# Patient Record
Sex: Male | Born: 1940
Health system: Southern US, Community
[De-identification: ages and names within clinical notes are randomized; demographics above are authoritative.]

## PROBLEM LIST (undated history)

## (undated) DIAGNOSIS — R972 Elevated prostate specific antigen [PSA]: Secondary | ICD-10-CM

## (undated) DIAGNOSIS — I219 Acute myocardial infarction, unspecified: Secondary | ICD-10-CM

## (undated) DIAGNOSIS — R9430 Abnormal result of cardiovascular function study, unspecified: Secondary | ICD-10-CM

## (undated) DIAGNOSIS — F329 Major depressive disorder, single episode, unspecified: Secondary | ICD-10-CM

## (undated) DIAGNOSIS — Z87442 Personal history of urinary calculi: Secondary | ICD-10-CM

## (undated) DIAGNOSIS — I4891 Unspecified atrial fibrillation: Secondary | ICD-10-CM

## (undated) DIAGNOSIS — E785 Hyperlipidemia, unspecified: Secondary | ICD-10-CM

## (undated) DIAGNOSIS — Z9889 Other specified postprocedural states: Secondary | ICD-10-CM

## (undated) DIAGNOSIS — D696 Thrombocytopenia, unspecified: Secondary | ICD-10-CM

## (undated) DIAGNOSIS — I739 Peripheral vascular disease, unspecified: Secondary | ICD-10-CM

## (undated) DIAGNOSIS — N400 Enlarged prostate without lower urinary tract symptoms: Secondary | ICD-10-CM

## (undated) DIAGNOSIS — I2510 Atherosclerotic heart disease of native coronary artery without angina pectoris: Secondary | ICD-10-CM

## (undated) DIAGNOSIS — F419 Anxiety disorder, unspecified: Secondary | ICD-10-CM

## (undated) DIAGNOSIS — Z951 Presence of aortocoronary bypass graft: Secondary | ICD-10-CM

## (undated) DIAGNOSIS — I779 Disorder of arteries and arterioles, unspecified: Secondary | ICD-10-CM

## (undated) DIAGNOSIS — M1990 Unspecified osteoarthritis, unspecified site: Secondary | ICD-10-CM

## (undated) DIAGNOSIS — N419 Inflammatory disease of prostate, unspecified: Secondary | ICD-10-CM

## (undated) DIAGNOSIS — IMO0002 Reserved for concepts with insufficient information to code with codable children: Secondary | ICD-10-CM

## (undated) HISTORY — DX: Anxiety disorder, unspecified: F41.9

## (undated) HISTORY — DX: Hyperlipidemia, unspecified: E78.5

## (undated) HISTORY — PX: HYDROCELE EXCISION: SHX482

## (undated) HISTORY — DX: Elevated prostate specific antigen (PSA): R97.20

## (undated) HISTORY — DX: Unspecified atrial fibrillation: I48.91

## (undated) HISTORY — DX: Major depressive disorder, single episode, unspecified: F32.9

## (undated) HISTORY — DX: Thrombocytopenia, unspecified: D69.6

## (undated) HISTORY — DX: Presence of aortocoronary bypass graft: Z95.1

## (undated) HISTORY — DX: Atherosclerotic heart disease of native coronary artery without angina pectoris: I25.10

## (undated) HISTORY — DX: Personal history of urinary calculi: Z87.442

## (undated) HISTORY — PX: OTHER SURGICAL HISTORY: SHX169

## (undated) HISTORY — PX: HEMORRHOID SURGERY: SHX153

## (undated) HISTORY — DX: Benign prostatic hyperplasia without lower urinary tract symptoms: N40.0

## (undated) HISTORY — DX: Disorder of arteries and arterioles, unspecified: I77.9

## (undated) HISTORY — DX: Inflammatory disease of prostate, unspecified: N41.9

## (undated) HISTORY — DX: Reserved for concepts with insufficient information to code with codable children: IMO0002

## (undated) HISTORY — DX: Other specified postprocedural states: Z98.890

## (undated) HISTORY — PX: ROTATOR CUFF REPAIR: SHX139

## (undated) HISTORY — DX: Abnormal result of cardiovascular function study, unspecified: R94.30

## (undated) HISTORY — DX: Peripheral vascular disease, unspecified: I73.9

---

## 1999-05-23 ENCOUNTER — Ambulatory Visit (HOSPITAL_BASED_OUTPATIENT_CLINIC_OR_DEPARTMENT_OTHER): Admission: RE | Admit: 1999-05-23 | Discharge: 1999-05-23 | Payer: Self-pay | Admitting: Orthopedic Surgery

## 2004-04-27 ENCOUNTER — Emergency Department (HOSPITAL_COMMUNITY): Admission: EM | Admit: 2004-04-27 | Discharge: 2004-04-27 | Payer: Self-pay | Admitting: Emergency Medicine

## 2005-12-11 HISTORY — PX: PROSTATE BIOPSY: SHX241

## 2006-02-05 ENCOUNTER — Ambulatory Visit: Payer: Self-pay | Admitting: Internal Medicine

## 2006-02-08 ENCOUNTER — Ambulatory Visit: Payer: Self-pay | Admitting: Internal Medicine

## 2006-03-01 ENCOUNTER — Ambulatory Visit: Payer: Self-pay | Admitting: Internal Medicine

## 2006-04-16 HISTORY — PX: INCISIONAL HERNIA REPAIR: SHX193

## 2006-04-17 ENCOUNTER — Inpatient Hospital Stay (HOSPITAL_COMMUNITY): Admission: RE | Admit: 2006-04-17 | Discharge: 2006-04-18 | Payer: Self-pay | Admitting: Surgery

## 2008-07-11 DIAGNOSIS — I2510 Atherosclerotic heart disease of native coronary artery without angina pectoris: Secondary | ICD-10-CM

## 2008-07-11 HISTORY — DX: Atherosclerotic heart disease of native coronary artery without angina pectoris: I25.10

## 2008-07-11 HISTORY — PX: CORONARY STENT PLACEMENT: SHX1402

## 2008-08-01 ENCOUNTER — Ambulatory Visit: Payer: Self-pay | Admitting: Cardiology

## 2008-08-01 ENCOUNTER — Ambulatory Visit: Payer: Self-pay | Admitting: Surgery

## 2008-08-01 ENCOUNTER — Inpatient Hospital Stay (HOSPITAL_COMMUNITY): Admission: AD | Admit: 2008-08-01 | Discharge: 2008-08-09 | Payer: Self-pay | Admitting: Cardiology

## 2008-08-03 ENCOUNTER — Encounter: Payer: Self-pay | Admitting: Surgery

## 2008-08-05 HISTORY — PX: CORONARY ARTERY BYPASS GRAFT: SHX141

## 2008-08-14 ENCOUNTER — Ambulatory Visit: Payer: Self-pay | Admitting: Cardiothoracic Surgery

## 2008-08-18 ENCOUNTER — Ambulatory Visit: Payer: Self-pay | Admitting: Cardiothoracic Surgery

## 2008-08-21 ENCOUNTER — Ambulatory Visit: Payer: Self-pay | Admitting: Cardiology

## 2008-08-31 ENCOUNTER — Ambulatory Visit: Payer: Self-pay | Admitting: Surgery

## 2008-08-31 ENCOUNTER — Encounter: Admission: RE | Admit: 2008-08-31 | Discharge: 2008-08-31 | Payer: Self-pay | Admitting: Surgery

## 2008-09-03 ENCOUNTER — Encounter (HOSPITAL_COMMUNITY): Admission: RE | Admit: 2008-09-03 | Discharge: 2008-12-09 | Payer: Self-pay | Admitting: Cardiology

## 2008-09-15 ENCOUNTER — Ambulatory Visit: Payer: Self-pay | Admitting: Surgery

## 2008-09-22 ENCOUNTER — Ambulatory Visit: Payer: Self-pay | Admitting: Cardiology

## 2008-09-25 ENCOUNTER — Ambulatory Visit: Payer: Self-pay | Admitting: Cardiology

## 2008-09-25 LAB — CONVERTED CEMR LAB
AST: 28 units/L (ref 0–37)
Albumin: 3.6 g/dL (ref 3.5–5.2)
Alkaline Phosphatase: 65 units/L (ref 39–117)
Bilirubin, Direct: 0.1 mg/dL (ref 0.0–0.3)
LDL Cholesterol: 61 mg/dL (ref 0–99)
Total Bilirubin: 0.6 mg/dL (ref 0.3–1.2)
Total CHOL/HDL Ratio: 3.7

## 2008-12-03 ENCOUNTER — Encounter (HOSPITAL_COMMUNITY): Admission: RE | Admit: 2008-12-03 | Discharge: 2008-12-08 | Payer: Self-pay | Admitting: Cardiology

## 2008-12-15 ENCOUNTER — Ambulatory Visit: Payer: Self-pay | Admitting: Cardiology

## 2008-12-24 ENCOUNTER — Encounter: Payer: Self-pay | Admitting: Internal Medicine

## 2009-02-04 ENCOUNTER — Ambulatory Visit: Payer: Self-pay | Admitting: Internal Medicine

## 2009-02-04 LAB — CONVERTED CEMR LAB
ALT: 20 units/L (ref 0–53)
Albumin: 3.9 g/dL (ref 3.5–5.2)
Basophils Absolute: 0 10*3/uL (ref 0.0–0.1)
Bilirubin, Direct: 0.3 mg/dL (ref 0.0–0.3)
Calcium: 9.3 mg/dL (ref 8.4–10.5)
Creatinine, Ser: 1.1 mg/dL (ref 0.4–1.5)
Eosinophils Absolute: 0.1 10*3/uL (ref 0.0–0.7)
Eosinophils Relative: 2 % (ref 0.0–5.0)
GFR calc Af Amer: 86 mL/min
Glucose, Bld: 98 mg/dL (ref 70–99)
HDL: 38.3 mg/dL — ABNORMAL LOW (ref >39.0)
Hgb A1c MFr Bld: 5.7 % (ref 4.6–6.0)
LDL Cholesterol: 58 mg/dL (ref 0–99)
Lymphocytes Relative: 36.1 % (ref 12.0–46.0)
MCV: 91.2 fL (ref 78.0–100.0)
Neutrophils Relative %: 52.8 % (ref 43.0–77.0)
PSA: 3.17 ng/mL (ref 0.10–4.00)
Platelets: 132 10*3/uL — ABNORMAL LOW (ref 150–400)
Sodium: 144 meq/L (ref 135–145)
TSH: 1.28 microintl units/mL (ref 0.35–5.50)
Total Protein: 6.6 g/dL (ref 6.0–8.3)
VLDL: 23 mg/dL (ref 0–40)
WBC: 4.8 10*3/uL (ref 4.5–10.5)

## 2009-02-09 ENCOUNTER — Ambulatory Visit: Payer: Self-pay | Admitting: Internal Medicine

## 2009-02-09 DIAGNOSIS — Z87442 Personal history of urinary calculi: Secondary | ICD-10-CM

## 2009-02-09 DIAGNOSIS — F32A Depression, unspecified: Secondary | ICD-10-CM | POA: Insufficient documentation

## 2009-02-09 DIAGNOSIS — N400 Enlarged prostate without lower urinary tract symptoms: Secondary | ICD-10-CM | POA: Insufficient documentation

## 2009-02-09 DIAGNOSIS — F329 Major depressive disorder, single episode, unspecified: Secondary | ICD-10-CM | POA: Insufficient documentation

## 2009-02-09 DIAGNOSIS — E785 Hyperlipidemia, unspecified: Secondary | ICD-10-CM

## 2009-02-09 DIAGNOSIS — R972 Elevated prostate specific antigen [PSA]: Secondary | ICD-10-CM | POA: Insufficient documentation

## 2009-02-09 DIAGNOSIS — F411 Generalized anxiety disorder: Secondary | ICD-10-CM | POA: Insufficient documentation

## 2009-02-09 HISTORY — DX: Hyperlipidemia, unspecified: E78.5

## 2009-02-09 HISTORY — DX: Personal history of urinary calculi: Z87.442

## 2009-07-13 ENCOUNTER — Encounter: Payer: Self-pay | Admitting: Cardiology

## 2009-07-13 DIAGNOSIS — Z951 Presence of aortocoronary bypass graft: Secondary | ICD-10-CM

## 2009-07-13 HISTORY — DX: Presence of aortocoronary bypass graft: Z95.1

## 2009-07-15 ENCOUNTER — Ambulatory Visit: Payer: Self-pay | Admitting: Cardiology

## 2009-07-15 DIAGNOSIS — R51 Headache: Secondary | ICD-10-CM | POA: Insufficient documentation

## 2009-07-15 DIAGNOSIS — R519 Headache, unspecified: Secondary | ICD-10-CM | POA: Insufficient documentation

## 2009-07-22 ENCOUNTER — Ambulatory Visit: Payer: Self-pay | Admitting: Internal Medicine

## 2009-07-22 DIAGNOSIS — R21 Rash and other nonspecific skin eruption: Secondary | ICD-10-CM | POA: Insufficient documentation

## 2009-07-22 DIAGNOSIS — R509 Fever, unspecified: Secondary | ICD-10-CM | POA: Insufficient documentation

## 2009-07-23 ENCOUNTER — Encounter: Payer: Self-pay | Admitting: Internal Medicine

## 2009-07-23 LAB — CONVERTED CEMR LAB
ALT: 27 units/L (ref 0–53)
Alkaline Phosphatase: 51 units/L (ref 39–117)
BUN: 13 mg/dL (ref 6–23)
Basophils Relative: 0.2 % (ref 0.0–3.0)
Bilirubin, Direct: 0.2 mg/dL (ref 0.0–0.3)
Calcium: 9.3 mg/dL (ref 8.4–10.5)
Chloride: 104 meq/L (ref 96–112)
Creatinine, Ser: 1 mg/dL (ref 0.4–1.5)
Eosinophils Relative: 1.1 % (ref 0.0–5.0)
GFR calc non Af Amer: 79.11 mL/min (ref >60)
Ketones, ur: NEGATIVE mg/dL
Lymphocytes Relative: 18.5 % (ref 12.0–46.0)
Monocytes Absolute: 0.5 10*3/uL (ref 0.1–1.0)
Monocytes Relative: 9.5 % (ref 3.0–12.0)
Neutrophils Relative %: 70.7 % (ref 43.0–77.0)
Platelets: 89 10*3/uL — ABNORMAL LOW (ref 150.0–400.0)
RBC: 5.02 M/uL (ref 4.22–5.81)
Sed Rate: 13 mm/hr (ref 0–22)
Specific Gravity, Urine: >=1.03 (ref 1.000–1.030)
Total Bilirubin: 0.8 mg/dL (ref 0.3–1.2)
Total Protein, Urine: NEGATIVE mg/dL
Total Protein: 7.4 g/dL (ref 6.0–8.3)
Urine Glucose: NEGATIVE mg/dL
Urobilinogen, UA: 0.2 (ref 0.0–1.0)
WBC: 5.4 10*3/uL (ref 4.5–10.5)

## 2009-07-26 ENCOUNTER — Ambulatory Visit: Payer: Self-pay | Admitting: Internal Medicine

## 2009-07-26 DIAGNOSIS — D696 Thrombocytopenia, unspecified: Secondary | ICD-10-CM | POA: Insufficient documentation

## 2009-07-26 LAB — CONVERTED CEMR LAB
ALT: 29 units/L (ref 0–53)
AST: 40 units/L — ABNORMAL HIGH (ref 0–37)
Alkaline Phosphatase: 49 units/L (ref 39–117)
Basophils Relative: 0.1 % (ref 0.0–3.0)
Bilirubin, Direct: 0.2 mg/dL (ref 0.0–0.3)
Eosinophils Relative: 1.4 % (ref 0.0–5.0)
Monocytes Relative: 10.6 % (ref 3.0–12.0)
Neutrophils Relative %: 52.5 % (ref 43.0–77.0)
Platelets: 106 10*3/uL — ABNORMAL LOW (ref 150.0–400.0)
RBC: 4.62 M/uL (ref 4.22–5.81)
Total Bilirubin: 0.8 mg/dL (ref 0.3–1.2)
Total Protein: 7.3 g/dL (ref 6.0–8.3)
WBC: 4.1 10*3/uL — ABNORMAL LOW (ref 4.5–10.5)

## 2009-07-30 ENCOUNTER — Ambulatory Visit: Payer: Self-pay | Admitting: Internal Medicine

## 2009-08-09 ENCOUNTER — Encounter: Payer: Self-pay | Admitting: Internal Medicine

## 2009-08-09 LAB — CBC WITH DIFFERENTIAL/PLATELET
BASO%: 0.5 % (ref 0.0–2.0)
EOS%: 1.3 % (ref 0.0–7.0)
HCT: 42.5 % (ref 38.4–49.9)
MCH: 30.1 pg (ref 27.2–33.4)
MCHC: 33.6 g/dL (ref 32.0–36.0)
MONO%: 8.8 % (ref 0.0–14.0)
NEUT%: 53.1 % (ref 39.0–75.0)
lymph#: 2.2 10*3/uL (ref 0.9–3.3)

## 2009-08-09 LAB — COMPREHENSIVE METABOLIC PANEL
ALT: 19 U/L (ref 0–53)
AST: 23 U/L (ref 0–37)
Alkaline Phosphatase: 51 U/L (ref 39–117)
Calcium: 9.1 mg/dL (ref 8.4–10.5)
Chloride: 103 mEq/L (ref 96–112)
Creatinine, Ser: 1.02 mg/dL (ref 0.40–1.50)
Total Bilirubin: 0.7 mg/dL (ref 0.3–1.2)

## 2009-10-28 ENCOUNTER — Ambulatory Visit: Payer: Self-pay | Admitting: Internal Medicine

## 2009-11-01 ENCOUNTER — Encounter: Payer: Self-pay | Admitting: Internal Medicine

## 2009-11-01 LAB — CBC WITH DIFFERENTIAL/PLATELET
BASO%: 0.7 % (ref 0.0–2.0)
Eosinophils Absolute: 0.1 10*3/uL (ref 0.0–0.5)
LYMPH%: 28.6 % (ref 14.0–49.0)
MCHC: 34 g/dL (ref 32.0–36.0)
MCV: 92.8 fL (ref 79.3–98.0)
MONO%: 8 % (ref 0.0–14.0)
Platelets: 120 10*3/uL — ABNORMAL LOW (ref 140–400)
RBC: 4.62 10*6/uL (ref 4.20–5.82)

## 2009-11-01 LAB — LACTATE DEHYDROGENASE: LDH: 119 U/L (ref 94–250)

## 2009-11-19 ENCOUNTER — Emergency Department (HOSPITAL_COMMUNITY): Admission: EM | Admit: 2009-11-19 | Discharge: 2009-11-19 | Payer: Self-pay | Admitting: Family Medicine

## 2010-01-28 ENCOUNTER — Ambulatory Visit: Payer: Self-pay | Admitting: Internal Medicine

## 2010-02-01 ENCOUNTER — Encounter: Payer: Self-pay | Admitting: Internal Medicine

## 2010-02-01 ENCOUNTER — Telehealth: Payer: Self-pay | Admitting: Cardiology

## 2010-02-01 LAB — CBC WITH DIFFERENTIAL/PLATELET
BASO%: 0.3 % (ref 0.0–2.0)
LYMPH%: 25.2 % (ref 14.0–49.0)
MCHC: 34 g/dL (ref 32.0–36.0)
MONO#: 0.4 10*3/uL (ref 0.1–0.9)
MONO%: 7.1 % (ref 0.0–14.0)
Platelets: 105 10*3/uL — ABNORMAL LOW (ref 140–400)
RBC: 4.74 10*6/uL (ref 4.20–5.82)
RDW: 12.4 % (ref 11.0–14.6)
WBC: 6.1 10*3/uL (ref 4.0–10.3)

## 2010-02-01 LAB — LACTATE DEHYDROGENASE: LDH: 118 U/L (ref 94–250)

## 2010-04-29 ENCOUNTER — Ambulatory Visit: Payer: Self-pay | Admitting: Internal Medicine

## 2010-05-02 ENCOUNTER — Encounter: Payer: Self-pay | Admitting: Cardiology

## 2010-05-02 ENCOUNTER — Encounter: Payer: Self-pay | Admitting: Internal Medicine

## 2010-05-02 LAB — CBC WITH DIFFERENTIAL/PLATELET
BASO%: 0.3 % (ref 0.0–2.0)
Basophils Absolute: 0 10*3/uL (ref 0.0–0.1)
EOS%: 1.6 % (ref 0.0–7.0)
HGB: 14.3 g/dL (ref 13.0–17.1)
MCH: 30.3 pg (ref 27.2–33.4)
MCHC: 33 g/dL (ref 32.0–36.0)
MCV: 91.7 fL (ref 79.3–98.0)
MONO%: 10.4 % (ref 0.0–14.0)
RBC: 4.71 10*6/uL (ref 4.20–5.82)
RDW: 12.6 % (ref 11.0–14.6)

## 2010-05-31 ENCOUNTER — Encounter (INDEPENDENT_AMBULATORY_CARE_PROVIDER_SITE_OTHER): Payer: Self-pay | Admitting: *Deleted

## 2010-07-24 ENCOUNTER — Encounter: Payer: Self-pay | Admitting: Cardiology

## 2010-07-26 ENCOUNTER — Ambulatory Visit: Payer: Self-pay | Admitting: Cardiology

## 2010-07-28 ENCOUNTER — Ambulatory Visit: Payer: Self-pay | Admitting: Cardiology

## 2010-08-01 ENCOUNTER — Encounter: Payer: Self-pay | Admitting: Cardiology

## 2010-08-01 LAB — CONVERTED CEMR LAB
Cholesterol: 119 mg/dL (ref 0–200)
HDL: 43 mg/dL (ref >39.00)
LDL Cholesterol: 59 mg/dL (ref 0–99)
Triglycerides: 83 mg/dL (ref 0.0–149.0)
VLDL: 16.6 mg/dL (ref 0.0–40.0)

## 2010-09-05 ENCOUNTER — Encounter: Payer: Self-pay | Admitting: Internal Medicine

## 2010-09-06 ENCOUNTER — Encounter: Admission: RE | Admit: 2010-09-06 | Discharge: 2010-09-06 | Payer: Self-pay | Admitting: Surgery

## 2010-09-28 ENCOUNTER — Encounter: Payer: Self-pay | Admitting: Internal Medicine

## 2010-10-27 ENCOUNTER — Ambulatory Visit: Payer: Self-pay | Admitting: Internal Medicine

## 2010-12-31 ENCOUNTER — Encounter: Payer: Self-pay | Admitting: Emergency Medicine

## 2011-01-01 ENCOUNTER — Encounter: Payer: Self-pay | Admitting: Surgery

## 2011-01-12 NOTE — Letter (Signed)
[facility]   Imported By: Lennie Odor 05/17/2010 14:32:46  _____________________________________________________________________  External Attachment:    Type:   Image     Comment:   External Document

## 2011-01-12 NOTE — Letter (Signed)
Summary: [facility], Main Office  [address]   [place], [place] [address]   Phone: [phone]  Fax: [phone]     August 01, 2010 MRN: [id number]   [facility] [address] [place], [place]  [address]   Dear Mr. Kotlyar,  We have reviewed your cholesterol results.  They are as follows:     Total Cholesterol:    119 (Desirable: less than 200)       HDL  Cholesterol:     43.00  (Desirable: greater than 40 for men and 50 for women)       LDL Cholesterol:       59  (Desirable: less than 100 for low risk and less than 70 for moderate to high risk)       Triglycerides:       83.0  (Desirable: less than 150)  Our recommendations include:  Looks good, continue current medications   Call our office at the number listed above if you have any questions.  Lowering your LDL cholesterol is important, but it is only one of a large number of "risk factors" that may indicate that you are at risk for heart disease, stroke or other complications of hardening of the arteries.  Other risk factors include:   A.  Cigarette Smoking* B.  High Blood Pressure* C.  Obesity* D.   Low HDL Cholesterol (see yours above)* E.   Diabetes Mellitus (higher risk if your is uncontrolled) F.  Family history of premature heart disease G.  Previous history of stroke or cardiovascular disease    *These are risk factors YOU HAVE CONTROL OVER.  For more information, visit .  There is now evidence that lowering the TOTAL CHOLESTEROL AND LDL CHOLESTEROL can reduce the risk of heart disease.  The American Heart Association recommends the following guidelines for the treatment of elevated cholesterol:  1.  If there is now current heart disease and less than two risk factors, TOTAL CHOLESTEROL should be less than 200 and LDL CHOLESTEROL should be less than 100. 2.  If there is current heart disease or two or more risk factors, TOTAL CHOLESTEROL should be less than 200 and LDL  CHOLESTEROL should be less than 70.  A diet low in cholesterol, saturated fat, and calories is the cornerstone of treatment for elevated cholesterol.  Cessation of smoking and exercise are also important in the management of elevated cholesterol and preventing vascular disease.  Studies have shown that 30 to 60 minutes of physical activity most days can help lower blood pressure, lower cholesterol, and keep your weight at a healthy level.  Drug therapy is used when cholesterol levels do not respond to therapeutic lifestyle changes (smoking cessation, diet, and exercise) and remains unacceptably high.  If medication is started, it is important to have you levels checked periodically to evaluate the need for further treatment options.  Thank you,    [facility] Team

## 2011-01-12 NOTE — Consult Note (Signed)
Summary: [facility]   Imported By: Sherian Rein 09/19/2010 11:11:39  _____________________________________________________________________  External Attachment:    Type:   Image     Comment:   External Document

## 2011-01-12 NOTE — Letter (Signed)
Summary: [facility]   Imported By: Sherian Rein 10/13/2010 14:58:41  _____________________________________________________________________  External Attachment:    Type:   Image     Comment:   External Document

## 2011-01-12 NOTE — Letter (Signed)
Summary: Appointment - Reminder 2  [facility], Main Office  [address]   [place], [place] [address]   Phone: [phone]  Fax: [phone]     May 31, 2010 MRN: [id number]   [facility] [address] [place], [place]  [address]   Dear [name],  Our records indicate that it is time to schedule a follow-up appointment with Dr. Myrtis Ser in August. It is very important that we reach you to schedule this appointment. We look forward to participating in your health care needs. Please contact us at the number listed above at your earliest convenience to schedule your appointment.  If you are unable to make an appointment at this time, give [place] a call so we can update our records.     Sincerely,   Migdalia Dk [facility] Scheduling Team

## 2011-01-12 NOTE — Progress Notes (Signed)
Summary: simvastatin cut in half  Phone Note Call from Patient Call back at Home Phone [phone] Call back at Work Phone [phone]   Caller: Patient Reason for Call: Talk to Nurse Details for Reason: Per pt calling, pt pcp wants him to cut his simvastin in half .  Initial call taken by: Lorne Skeens,  February 01, 2010 11:05 AM  Follow-up for Phone Call        pt saw hematology this am, they feel he could have drug induced thrjombocytopenia and want to cut his simvastatin back, pt aware that will be ok w/us Meredith Staggers, RN  February 01, 2010 11:31 AM

## 2011-01-12 NOTE — Letter (Signed)
[facility]   Imported By: Lester Blairsden 02/24/2010 09:02:03  _____________________________________________________________________  External Attachment:    Type:   Image     Comment:   External Document

## 2011-01-12 NOTE — Letter (Signed)
Summary: [facility]  [facility]   Imported By: Debby Freiberg 06/09/2010 12:04:08  _____________________________________________________________________  External Attachment:    Type:   Image     Comment:   External Document

## 2011-01-12 NOTE — Miscellaneous (Signed)
  Clinical Lists Changes  Observations: Added new observation of PAST MED HX: PSA increased  Nephrolithiasis, hx of hx of prostatitis Hyperlipidemia CAD  Two bare metal stents for MI  07/2008 CABG  08/05/08.Marland KitchenCABG X 5  ... Bartle Atrial fibrillation  - post MI - amiodarone stopped 10/09 Anxiety Depression Benign prostatic hypertrophy Rotator Cuff difficulties Thrombocytopenia (07/24/2010 16:29) Added new observation of PRIMARY MD: [name], MD (07/24/2010 16:29)       Past History:  Past Medical History: PSA increased  Nephrolithiasis, hx of hx of prostatitis Hyperlipidemia CAD  Two bare metal stents for MI  07/2008 CABG  08/05/08.Marland KitchenCABG X 5  ... Bartle Atrial fibrillation  - post MI - amiodarone stopped 10/09 Anxiety Depression Benign prostatic hypertrophy Rotator Cuff difficulties Thrombocytopenia

## 2011-01-12 NOTE — Assessment & Plan Note (Signed)
Summary: [name]   Visit Type:  1 YR F/U Referring Donis Pinder:  Dr. Arbutus Ped..   cancer center.. Primary [name]:  [name] Ruiz, MD   History of Present Illness: The patient is seen for cardiology followup.  He's done very well.  He underwent bypass surgery in August, 2009.  He has been active.  Is not having any chest pain.  He had some thrombocytopenia.  Dr. Arbutus Ped has questioned whether it could be related to simvastatin or ITP.  The patient's simvastatin dose was decreased from 40-20 mg.  He tells me that his platelets have been coming up.  I do not have followup lipids since the dose was changed.  Current Medications (verified): 1)  Simvastatin 40 Mg Tabs (Simvastatin) .... 1/2 Tab Daily 2)  Ecotrin 325 Mg Tbec (Aspirin) .[name].. 1 By Mouth Once Daily 3)  Multivitamins   Tabs (Multiple Vitamin) .[name].. 1 Tab Once Daily 4)  Fish Oil 1200 Mg Caps (Omega-3 Fatty Acids) .[name].. 1 Cap Once Daily  Allergies: 1)  ! Niacin  Past History:  Past Medical History: PSA increased  Nephrolithiasis, hx of hx of prostatitis Hyperlipidemia CAD  Two bare metal stents for MI  07/2008 CABG  08/05/08.[name] KitchenCABG X 5  ... Bartle Atrial fibrillation  - post MI - amiodarone stopped 10/09 Anxiety Depression Benign prostatic hypertrophy Rotator Cuff difficulties Thrombocytopenia   Drug (simva)  vs ITP .... Dr.Mohamed  Review of Systems       Patient denies fever, chills, headache, sweats, rash, change in vision, change in hearing, chest pain, cough, nausea vomiting, urinary symptoms.  All other systems are reviewed and are negative.  Vital Signs:  Patient profile:   70 year old male Height:      63 inches Weight:      136 pounds BMI:     24.18 Pulse rate:   61 / minute Pulse rhythm:   regular BP sitting:   108 / 58  (left arm) Cuff size:   regular  Vitals Entered By: Danielle Rankin, CMA (July 26, 2010 8:51 AM)  Physical Exam  General:  patient looks quite good today. Head:  head is atraumatic. Eyes:  no  xanthelasma. Neck:  no carotid bruits.  No jugular venous distention. Chest Wall:  no chest wall tenderness. Lungs:  lungs are clear.  Respiratory effort is nonlabored. Heart:  cardiac exam reveals S1-S2.  No clicks or significant murmurs. Abdomen:  abdomen is soft. Msk:  no musculoskeletal deformities. Extremities:  no peripheral edema. Skin:  no skin rashes. Psych:  patient is oriented to person time and place.  Affect is normal.   Impression & Recommendations:  Problem # 1:  THROMBOCYTOPENIA (ICD-287.5) It is felt that the patient's decrease in platelets could be related to simvastatin or ITP.  His simvastatin dose was reduced to 20 mg.  He tells me that his platelets are improving.  He will need followup lipid profile to be sure that he meets guidelines on a lower dose of simvastatin.  Problem # 2:  HEADACHE (ICD-784.0)  His updated medication list for this problem includes:    Ecotrin 325 Mg Tbec (Aspirin) .[name]... 1 by mouth once daily Headaches are improved after the patient worked on his bed and pillow.  Problem # 3:  ATRIAL FIBRILLATION (ICD-427.31)  His updated medication list for this problem includes:    Ecotrin 325 Mg Tbec (Aspirin) .[name]... 1 by mouth once daily Rhythm is stable.  No change  Problem # 4:  CORONARY ARTERY DISEASE (ICD-414.00)  His updated medication list for this problem includes:    Ecotrin 325 Mg Tbec (Aspirin) .[name]... 1 by mouth once daily Coronary disease is stable.  We discussed the fact that he did not need a treadmill at this time.  We discussed his exercise program and is quite good.  EKG is done today and reviewed by me.  It is normal.  Problem # 5:  HYPERLIPIDEMIA (ICD-272.4)  His updated medication list for this problem includes:    Simvastatin 40 Mg Tabs (Simvastatin) .[name]... 1/2 tab daily The patient is on 20 mg of simvastatin.  He needs a fasting lipid profile and I will decide what to do about his medications.  If we think that his decreased  platelets could be related to simvastatin I will need to discuss whether other statins may cause this in his case.  I have reviewed the chart and I do not see recent cholesterol values.  We will obtain a fasting lipid.  It is of note that I think it is rare for simvastatin to cause this problem but I'm sure Dr. Arbutus Ped is more familiar with this than me.  Patient Instructions: 1)  Your physician recommends that you return for a FASTING lipid profile: when convenient (272.2) 2)  Your physician wants you to follow-up in:  1 year.   You will receive a reminder letter in the mail two months in advance. If you don't receive a letter, please call our office to schedule the follow-up appointment.

## 2011-01-23 ENCOUNTER — Telehealth: Payer: Self-pay | Admitting: Cardiology

## 2011-02-01 NOTE — Progress Notes (Signed)
Summary: need refill pt out medication  Phone Note Refill Request Message from:  Patient on January 23, 2011 11:20 AM  Refills Requested: Medication #1:  SIMVASTATIN 40 MG TABS 1/2 TAB DAILY [facility]  Initial call taken by: Judie Grieve,  January 23, 2011 11:20 AM    Prescriptions: SIMVASTATIN 40 MG TABS (SIMVASTATIN) 1/2 TAB DAILY  #45 x 2   Entered by:   Hardin Negus, RMA   Authorized by:   Talitha Givens, MD, [facility]   Signed by:   Hardin Negus, RMA on 01/23/2011   Method used:   Electronically to        [facility]  [phone]* (retail)       [address]       [place], [place]  [address]       Ph: [phone] or [phone]       Fax: [phone]   RxID:   [id number]

## 2011-04-25 NOTE — Assessment & Plan Note (Signed)
OFFICE VISIT   [name], [name] C  DOB:  1941/11/23                                        August 14, 2008  CHART #:  [id number]   The patient underwent coronary artery bypass grafting x5 by Dr. Laneta [name]  on August 05, 2008.  He has done well.  He has been maintained on the  Plavix postoperatively.  The patient is a very thin individual and noted  increasing bruising and soreness around his right endovein harvest site  and with holiday at weekend, came in today to have his leg checked.   PHYSICAL EXAMINATION:  VITAL SIGNS:  His blood pressure 127/69, pulse  85, respiratory rate is 18, O2 sat is 95%, and temperature 98.9.  CHEST:  The patient's sternum is stable and healing well.  Lungs are  clear.  EXTREMITIES:  He has bruising along the endovein harvest site,  especially between the knee and the calf incision with the bruising  extending down to the ankle.  Nylon sutures are in the incision at the  knee and these were removed today.  The upper part of his thighs were  less involved without significant bruising and the wound does not appear  to be infected.   The care of the leg was reviewed with the patient and his wife to keep  it elevated as much as possible.  Should he start developing increasing  erythema or fever, to call immediately.  Otherwise, to keep his leg  elevated as much as possible and when not walking and return to the  office on Tuesday to have a followup wound check.  If he has increasing  difficulty, instructed to call the office before then.   Sheliah Plane, MD  Electronically Signed   EG/MEDQ  D:  08/14/2008  T:  08/15/2008  Job:  [id number]   cc:   Evelene Croon, M.D.

## 2011-04-25 NOTE — Cardiovascular Report (Signed)
NAMEELWYN, KLOSINSKI               ACCOUNT NO.:  [id number]   MEDICAL RECORD NO.:  [id number]          PATIENT TYPE:  INP   LOCATION:  2911                         FACILITY:  [facility]   PHYSICIAN:  Vesta Mixer, M.D. DATE OF BIRTH:  1941/08/25   DATE OF PROCEDURE:  08/01/2008  DATE OF DISCHARGE:                            CARDIAC CATHETERIZATION   [name] is a 70 year old gentleman with a history of  hypercholesterolemia.  He has a history of hernia repair.  He was out  playing golf and had severe onset of chest pain.  He was taken to  [facility] where he was found to have ST-segment elevation in  inferior leads consistent with an inferior wall myocardial infarction.  He was transferred to [facility] for further evaluation and was  brought directly to the cath lab.  Please see dictated H&P by Dr. Myrtis Ser.   PROCEDURE:  Left heart catheterization with coronary angiography with  stenting of the proximal LAD and PTCA of the distal LAD.   The right femoral artery was easily cannulated using modified Seldinger  technique.   HEMODYNAMIC RESULTS:  LV pressure was 119/3 with an aortic pressure of  118/71.   ANGIOGRAPHY:  Left main.  The left main has minor luminal  irregularities.   The left anterior descending artery is diffusely diseased.  This is a  50% stenosis at its origin.  This was followed shortly by 60-70%  stenosis.  The mid LAD has a 50% stenosis followed by 70% stenosis.  The  distal LAD has a long stenosis that starts off around 80% gets as tight  as 90%.  The distal LAD has minor luminal irregularities following this.   The first diagonal artery is a moderate-sized vessel.  There is a  proximal 70% irregularity.   The left circumflex artery is a moderately large system.  It divides  into the first and second obtuse marginal fairly quickly.  The first OM  has a 60% proximal stenosis.  The second obtuse marginal artery has long  60% diffuse  disease.   The right coronary artery is occluded proximally.   The left ventriculogram was performed in a 30-RAO position.  It reveals  overall normal left ventricular systolic function but with inferior  basilar akinesis.   A PCI to the right coronary artery was engaged using a Judkins right 4  guide.  Angiomax was given.  The initial ACT was 252.   A Prowater angioplasty wire was placed down into the distal right  coronary artery.  The patient developed some bradycardia after  reperfusion and we gave him 1 mg of atropine.   A 2.5 x 15-mm apex was passed down into the proximal stenosis.  It was  inflated at this location a total of 3 times with a peak pressure of 8  atmospheres.  There was a mid stenosis of about 90% and a distal  stenosis of 90%.  The mid stenosis was approached using the 2.5 x 15 mm  apex.  It was inflated up to 6 atmospheres for 30 seconds.  The apex was  then passed distally and was inflated up to 8 atmospheres for 30  seconds.   This resulted in some improvement of the lesion.  This balloon was  removed and a 3.0 x 32 mm Liberte stent was positioned across the  proximal and mid stenosis.  It was inflated up to 12 atmospheres for 32  seconds.  Poststent dilatation was achieved using a 3.25 x 20 mm Quantum  Monorail.  It was positioned distally and inflated up to 12 atmospheres  for 20 seconds and then pulled back proximally and inflated up to 14  atmospheres for 15 seconds.  This gave [place] a very nice angiographic  result of the stent.  There was still some disease in the proximal  segment, but it appears to be stable.  Following this, the Quantum  Monorail was positioned in the distal stenosis and was inflated up to 6  atmospheres for 60 seconds.  The final balloon size was about 3 mm in  diameter.  This gave [place] a very nice angiographic result.  We decided not  to place a stent in this segment because of a likelihood that the  patient would need to go for  coronary artery bypass grafting and we did  not think that he needed a stent to keep this segment open in the  interim.   COMPLICATIONS:  None.   CONCLUSIONS:  1. Successful stenting to the proximal and mid right coronary artery.  2. Successful percutaneous transluminal coronary angioplasty of the      distal right coronary artery.  The patient has severe diffuse      disease involving the left anterior descending diagonal and left      circumflex artery.  I suspect that he will need coronary artery      bypass grafting.  He will need risk factor modification as well.      The patient was stable when leaving the lab.           ______________________________  Vesta Mixer, M.D.     PJN/MEDQ  D:  08/01/2008  T:  08/02/2008  Job:  [id number]   cc:   Luis Abed, MD, [facility]

## 2011-04-25 NOTE — Assessment & Plan Note (Signed)
OFFICE VISIT   Deboer, Gibson C  DOB:  27-Feb-1941                                        September 15, 2008  CHART #:  [id number]   The patient returned today for followup status post coronary artery  bypass graft surgery x5 on 08/05/2008.  He had an uncomplicated  postoperative course, but did develop postoperative atrial fibrillation  requiring amiodarone.  When he initially presented, he was transferred  from [facility] with an acute occlusion of his right coronary  artery that was treated with emergent angioplasty and stenting with a  good result.  He was started back on Plavix and aspirin postoperatively.  He said that he has been feeling well overall and is participating in  cardiac rehabilitation without any difficulty.  He has been seen back in  our office due to some drainage from his saphenous vein harvest site  medial to the right knee.  This opened up spontaneously and drained  large amount of old dark, bloody fluid.  He has been doing wet-to-dry  dressing changes to this at home.   PHYSICAL EXAMINATION:  VITAL SIGNS:  Today, his blood pressure is  128/79, his pulse is 80 and regular.  Respiratory rate is 18 and  unlabored.  Oxygen saturation on room air is 98%.  GENERAL:  He looks well.  CARDIAC:  Regular rate and rhythm with normal heart sounds.  LUNGS:  Clear.  Chest incision is healing well and sternum is stable.  EXTREMITIES:  His left leg incision is well healed.  The right leg  saphenous vein harvest site has a small residual open wound, but there  is no drainage and is very clean.  There is no sign of infection.  There  is no peripheral edema.   MEDICATIONS:  Amiodarone 200 mg daily, Plavix 75 mg daily, aspirin 325  mg daily, and Zocor 40 mg daily.   IMPRESSION:  Overall, the patient is recovering very well following a  surgery.  His right leg wound is healing up nicely and I told him, I do  not think any further wet-to-dry  dressings are necessary.  He was  instructed to keep it covered with a dry dressing until there is no  further drainage.  I told him he can return to driving a car, but should  refrain lifting anything heavier than 10 pounds for total of 3 months  from date of surgery.  He will continue to follow up with Dr. Myrtis Ser and  will contact me if he develops any problems with these incisions.   Evelene Croon, M.D.  Electronically Signed   BB/MEDQ  D:  09/15/2008  T:  09/16/2008  Job:  [id number]   cc:   Luis Abed, MD, [facility]

## 2011-04-25 NOTE — Op Note (Signed)
[name], [name]               ACCOUNT NO.:  [id number]   MEDICAL RECORD NO.:  [id number]          PATIENT TYPE:  INP   LOCATION:  2305                         FACILITY:  [facility]   PHYSICIAN:  Evelene Croon, M.D.     DATE OF BIRTH:  [birth year]   DATE OF PROCEDURE:  08/05/2008  DATE OF DISCHARGE:                               OPERATIVE REPORT   PREOPERATIVE DIAGNOSIS:  Severe three-vessel coronary artery disease  status post acute inferior myocardial infarction and percutaneous  intervention.   POSTOPERATIVE DIAGNOSIS:  Severe three-vessel coronary artery disease  status post acute inferior myocardial infarction and percutaneous  intervention.   OPERATIVE PROCEDURE:  Median sternotomy, extracorporeal circulation,  coronary artery bypass graft surgery x 5 using a left internal mammary  artery graft to the left anterior descending coronary artery, with a  saphenous vein graft to the diagonal branch of the LAD, a saphenous vein  graft to the posterior descending branch of the right coronary artery,  and a sequential saphenous vein graft to the first and second obtuse  marginal branches of the left circumflex coronary artery.  Endoscopic  vein harvesting from the right leg.   ATTENDING SURGEON:  Evelene Croon, MD   ASSISTANT:  Salvatore Decent. Cornelius Moras, MD   SECOND ASSISTANT:  Rowe Clack, PA-C   ANESTHESIA:  General endotracheal.   CLINICAL HISTORY:  This patient is a 70 year old gentleman with no prior  cardiac history who presented with an acute inferior myocardial  infarction while playing golf.  He presented to [facility] and  was transferred immediately to [facility] where he underwent urgent  catheterization showing severe three-vessel coronary artery disease with  acutely occluded right coronary artery.  The patient underwent  angioplasty and stenting of the right coronary artery with a good  result.  Left ventricular function appeared good.  The patient had an  uncomplicated angioplasty procedure and remained stable afterwards  without any chest pain.  He was continued on Integrilin.  I was asked to  review his case to decide if coronary artery bypass graft surgery would  be indicated.  I felt this would be the best option given that he had  severe diffuse three-vessel coronary artery disease.  I discussed the  operative procedure with the patient including alternatives, benefits,  and risks including but not limited to bleeding, blood transfusion,  infection, stroke, myocardial infarction, graft failure, and death.  He  understood and agreed to proceed.   OPERATIVE PROCEDURE:  The patient was taken to the operating room and  placed on the table in supine position.  After induction of general  endotracheal anesthesia, a Foley catheter was placed into the bladder  using a sterile technique.  Then the chest, abdomen and both lower  extremities were prepped and draped in usual sterile manner.  The chest  was entered through a median sternotomy incision and the pericardium  opened in midline.  Examination of the heart showed good ventricular  contractility.  The ascending aorta had no palpable plaques in it.   Then the left internal mammary artery was harvested from the  chest wall  as a pedicle graft.  This was a medium caliber vessel with excellent  blood flow through it.  At the same time a segment of greater saphenous  vein was harvested from the right leg using endoscopic vein harvest  technique.  This vein was of medium size and good quality.  I was  initially concerned that we may not have enough vein harvested from the  right leg and therefore we began exposing the left greater saphenous  vein at the knee in case we would need another piece.  After the right  saphenous vein was fully flushed and dilated, it was apparent that there  was enough suitable vein and therefore we did not need to harvest any  vein from the left leg.   Then the  patient was heparinized when an adequate activated clotting  time was achieved.  The distal ascending aorta was cannulated using a 20-  French aortic cannula for arterial inflow.  Venous outflow was achieved  using a two-stage venous cannula through the right atrial appendage.  An  antegrade cardioplegia and vent cannula was inserted into the aortic  root.   The patient was placed on cardiopulmonary bypass and distal coronary  artery was identified.  The LAD was diffusely diseased throughout its  proximal and midportions.  There was an area at the junction of the  middle and distal third which was softer and suitable for grafting.  This supplied the distal LAD which wrapped around the apex.  The  diagonal branch was a moderate size graftable vessel.  The two obtuse  marginal branches were both small but suitable for grafting.  The  posterior descending artery was also relatively small and suitable for  grafting.  Both the posterior descending and obtuse marginal branch  appeared smaller than it did on catheterization.   Then the aorta was cross-clamped and 500 mL of cold blood antegrade  cardioplegia was administered in the aortic root with quick arrest of  the heart.  Systemic hypothermia to 20 degrees centigrade and topical  hypothermic iced saline was used.  A temperature probe was placed in the  septum and insulating pad in the pericardium.   The first distal anastomosis was then performed to the posterior  descending coronary artery.  The internal diameter was about 1.5-1.6 mm.  The conduit used was a segment of greater saphenous vein and the  anastomosis performed in an end-to-side manner using continuous 7-0  Prolene suture.  Flow was noted through the graft and was excellent.   Second distal anastomosis was performed to the first marginal branch.  The internal diameter was about 1.6 mm.  The conduit used was a second  segment of greater saphenous vein and anastomosis performed  in a  sequential side-to-side manner using continuous 8-0 Prolene suture.  Flow was noted through the graft and was excellent.   The third distal anastomosis was performed to the second marginal  branch.  The internal diameter was about 1.6 mm.  The conduit used was  the same segment of greater saphenous vein and the anastomosis performed  in a sequential end-to-side manner using continuous 7-0 Prolene suture.  Flow was noted through the graft and was excellent.  Then another dose  of cardioplegia was given down the vein grafts and aortic root.   The fourth distal anastomosis was performed to the diagonal branch.  The  internal diameter was about 1.75 mm.  The conduit used was a third  segment of  greater saphenous vein and the anastomosis performed in an  end-to-side manner using continuous 7-0 Prolene suture.  Flow was noted  through the graft and was excellent.   The fifth distal anastomosis was performed to the distal LAD.  The  internal diameter was about 1.75 mm.  The conduit used was the left  internal mammary graft and was brought through an opening in the left  pericardium and anterior to the phrenic nerve.  It was anastomosed to  the LAD in an end-to-side manner using continuous 8-0 Prolene suture.  The pedicle was sutured to the epicardium with 6-0 Prolene sutures.  The  patient was rewarmed to 37 degrees centigrade.  With the cross-clamp in  place, the three proximal vein graft anastomoses were performed to the  aortic root in an end-to-side manner using continuous 6-0 Prolene  suture.  Then the clamp was removed from the mammary pedicle.  There was  rapid warming of the ventricular septum and return of spontaneous  ventricular fibrillation.  The cross-clamp was removed with a time of 93  minutes and the patient spontaneously converted to sinus rhythm.  The  proximal and distal anastomoses appeared hemostatic while the graft was  satisfactory.  Graft markers were placed  around the proximal  anastomoses.  Two temporary right ventricular and right atrial pacing  wires were placed and brought through the skin.   When the patient was rewarmed to 37 degrees centigrade, he was weaned  from cardiopulmonary bypass on no inotropic agents.  The total bypass  time was 107 minutes.  Cardiac function appeared excellent with cardiac  output of 4 liters per minute.  Protamine was given and venous and  aortic cannula was removed without difficulty.  Hemostasis was achieved.  The patient was given 1 unit of platelets due to thrombocytopenia and a  platelet count of 70,000.  He had some obvious coagulopathy.  Then three  chest tubes were placed with a tube in the posterior pericardium, one in  the left pleural space and one in the anterior mediastinum.  The sternum  was then closed with #6 stainless steel wires.  The fascia was closed  with continuous #1 Vicryl suture.  Subcutaneous tissue was closed with  continuous 2-0 Vicryl and the skin with 3-0 Vicryl subcuticular closure.  The lower extremity vein harvest site was closed in layers in a similar  manner.  The sponge, needle and instrument counts were correct according  to scrub nurse.  Dry sterile dressing was applied over the incisions  around the chest tubes which were Pleur-evac suction.  The patient  remained hemodynamically stable and was transported to the SICU in  guarded but stable condition.      Evelene Croon, M.D.  Electronically Signed     BB/MEDQ  D:  08/05/2008  T:  08/06/2008  Job:  [id number]   cc:   [facility]

## 2011-04-25 NOTE — Assessment & Plan Note (Signed)
[facility]                            CARDIOLOGY OFFICE NOTE   NAME:Nikolai, FOCH ROSENWALD                      MRN:          [id number]  DATE:09/22/2008                            DOB:          [birth year]    [name] is here today for the followup of his coronary disease and the  followup of his lipids and followup of his atrial fibrillation.  He  underwent CABG.  He is recovering nicely.  He has been on simvastatin  and he is not having any significant symptoms, but he will need followup  labs.  He had atrial fibrillation post CABG and he was discharged on  amiodarone.  I had decreased his dose from 400-200 and hopefully it can  be stopped today.  The patient has an additional problem of pain in his  right shoulder and right neck.  He of course wants to be sure that this  is not cardiac pain.  He noticed this when he was doing excessive rehab  work with his cardiac rehab.  Most likely, it is musculoskeletal.   PAST MEDICAL HISTORY:   ALLERGIES:  No known drug allergies.   MEDICATIONS:  1. Plavix 75.  2. Aspirin 325.  3. Amiodarone (stopped Today).  4. Simvastatin 40.   OTHER MEDICAL PROBLEMS:  See the list below.   REVIEW OF SYSTEMS:  He is not having any significant GI or GU symptoms.  He has no fevers.  He has no skin rashes.  The areas of post CABG have  healed completely.  Otherwise, his review of systems is negative.   PHYSICAL EXAMINATION:  VITAL SIGNS:  Blood pressure is 115/70 with a  pulse of 68.  GENERAL:  The patient is oriented to person, time and place.  Affect is  normal.  HEENT:  No xanthelasma.  He has normal extraocular motion.  NECK:  There are no carotid bruits.  There is no jugular venous  distention.  LUNGS:  Clear.  Respiratory effort is not labored.  CARDIAC:  S1 with an S2.  There are no clicks or significant murmurs.  His rhythm on physical exam is perfectly regular.  ABDOMEN:  Soft.  He has no masses or bruits.  EXTREMITIES:  There is no peripheral edema.   PROBLEMS:  1. History of kidney stones.  2. History of rotator cuff difficulties in the past and a broken leg.  3. Postoperative atrial fibrillation.  His rhythm is regular.  It is      now time for him to stop his amiodarone and I have told him to stop      it as of today.  4. Acute inferior ST elevation myocardial infarction.  He received two      bare-metal stents.  He remains on Plavix.  I will decide over time      how long to use Plavix for this.  5. Status post coronary artery bypass graft by Dr. Laneta Simmers.  He is      recovering very nicely.  6. New pain in his right shoulder.  After complete evaluation, I  believe that this is musculoskeletal in origin.   The patient will have a follow up fasting lipid profile with liver.  He  will stop his amiodarone.  I will see him back for cardiology follow up  in 3 months.     Luis Abed, MD, [facility]  Electronically Signed    JDK/MedQ  DD: 09/22/2008  DT: 09/23/2008  Job #: [id number]

## 2011-04-25 NOTE — Discharge Summary (Signed)
[name], [name]               ACCOUNT NO.:  [id number]   MEDICAL RECORD NO.:  [id number]          PATIENT TYPE:  INP   LOCATION:  2039                         FACILITY:  [facility]   PHYSICIAN:  Evelene Croon, M.D.     DATE OF BIRTH:  01/21/1941   DATE OF ADMISSION:  08/01/2008  DATE OF DISCHARGE:                               DISCHARGE SUMMARY   PRIMARY ADMITTING DIAGNOSIS:  Chest pain.   ADDITIONAL/DISCHARGE DIAGNOSES:  1. Acute inferior ST-elevation myocardial infarction.  2. History of hyperlipidemia on no medications at home.  3. Postoperative atrial fibrillation.  4. Severe coronary artery disease.  5. Postoperative blood loss anemia.   PROCEDURES PERFORMED:  1. Cardiac catheterization.  2. Coronary artery bypass grafting x5 (left internal mammary artery to      the LAD, saphenous vein graft to the posterior descending,      saphenous vein graft to the diagonal, sequential saphenous vein      graft to the first and the second obtuse marginals).  3. Endoscopic vein harvest, right leg.   HISTORY:  The patient is a 70 year old male who on the date of this  admission developed anterior chest discomfort with radiation to his  right arm while playing golf.  He had some associated nausea and  diaphoresis.  The pain persisted, and he subsequently presented to the  Emergency Department at [facility] for further evaluation.  An  EKG performed there in the emergency room showed ST-segment elevation  inferiorly with reciprocal changes anterolaterally.  Because of these  findings, he was transferred emergently via [facility] to [facility] for cardiac catheterization and further cardiac workup.   HOSPITAL COURSE:  [name] was taken directly to the cath lab once he  reached [facility].  Catheterization showed severe diffuse  coronary artery disease.  He underwent successful stenting to the  proximal and mid right coronary artery and successful PTCA of the  distal  right coronary artery.  Because of his multivessel disease, he was felt  to be a candidate for surgical revascularization.  He was continued on  Integrilin IV and Dr. Evelene Croon from Cardiac Surgery was consulted  for surgical opinion.  He reviewed the patient's films, and after  evaluation, the patient agreed that his best course of action would be  to proceed with CABG at this time.  He remained stable and pain-free  following his catheterization on Integrilin.  His preoperative workup  included a carotid Doppler study, which showed no ICA stenosis and lower  extremity ABIs, which were normal bilaterally.  He was taken to the  operating room on August 05, 2008, and underwent CABG x5 as described in  detail above.  He tolerated the procedure well and was transferred to  the SICU in stable condition.  He was able to be extubated shortly after  surgery.  He was hemodynamically stable and doing well on postop day #1.  He remained in the unit for further observation.  He developed  postoperative atrial fibrillation and was started on IV amiodarone.  He  did convert to  normal sinus rhythm, but also developed some bradycardia,  which required AAI pacing.  He was stabilized and by postop day #2 was  ready for transfer to the floor.  Overall, postoperative course has been  relatively uneventful.  His bradycardia has resolved, and at present,  his pacer has been discontinued.  His blood pressures have remained on  the low side running in the 90 to low 100s systolic, and he, because of  his bradycardia and hypotension, has not been started on a beta-blocker  postoperatively.  It is felt that this can be performed as an  outpatient.  He has been somewhat volume overloaded and has been gently  diuresed.  He remains about 6 kg above his preoperative weight with some  mild lower extremity edema on physical exam.  His incisions are all  healing well.  He is ambulating in the halls without  problem.  He did  have some postoperative nausea, which was thought to be secondary to his  narcotic pain medication, and this has been discontinued.  His nausea  has since resolved.  His bowels were working properly, and he is  tolerating regular diet.   LABORATORY DATA:  His most recent labs showed hemoglobin of 9.3,  hematocrit 27.3, platelets 71,000, white count 6.3, sodium 136,  potassium 4.3, BUN 9, and creatinine 0.88.  He will have repeat labs on  the morning of August 09, 2008.  It is anticipated that if he continues  to progress, he will hopefully be ready for discharge home in the next  24-48 hours.   DISCHARGE MEDICATIONS:  Discharge medications are as follows:  1. Plavix 75 mg daily.  2. Aspirin 325 mg daily.  3. Amiodarone 200 mg b.i.d.  4. Lasix 40 mg daily x3 days.  5. Potassium 20 mEq daily x3 days.  6. Ultram 50-100 mg q.4 h. p.r.n. for pain.   DISCHARGE INSTRUCTIONS:  He is asked to refrain from driving, heavy  lifting, or strenuous activity.  He may continue ambulating daily and  using his incentive spirometer.  He may shower daily and clean his  incisions with soap and water.  He will continue a low-fat, low-sodium  diet.   DISCHARGE FOLLOWUP:  He will need to make an appointment to see Dr. Myrtis Ser  in 2 weeks for followup.  He will also be contacted by the [facility]  with an appointment to see Dr. Laneta Simmers in 3 weeks, and he will have a  chest x-ray prior to this appointment.  In the interim, if he  experiences any problems or has questions, he is asked to contact our  office immediately.      [name], P.A.      Evelene Croon, M.D.  Electronically Signed    GC/MEDQ  D:  08/08/2008  T:  08/09/2008  Job:  [id number]   cc:   Corwin Levins, MD

## 2011-04-25 NOTE — Assessment & Plan Note (Signed)
[facility]                            CARDIOLOGY OFFICE NOTE   NAME:Callari, TRACI PLEMONS                      MRN:          [id number]  DATE:08/21/2008                            DOB:          01/27/1941    Mr. Brannen is here for Cardiology followup.  He had been playing golf and  developed chest pain.  He was actually playing golf with one of our  employees husband.  He was taken to [facility] and immediately transported  to [facility] with an inferior ST-elevation MI.  He was treated aggressively.  I was there at that day along with Dr. Kristeen Miss, who did his urgent  catheterization.  He did received a stent to the right coronary artery.  I will recheck, but I am fairly certain that it was a bare-metal stent.  He then clearly had more diffuse disease and, it was felt, a CABG was  indicated.  He actually did receive stenting to the proximal and mid  right coronary artery.  Ultimately, a CABG was done by Dr. Laneta Simmers.  He  did quite well.  He had atrial fib after the surgery and he was placed  on amiodarone.  He remains on amiodarone.  He is in sinus rhythm today  and his amiodarone can be stopped.  His surgery included CABG x5 with a  LIMA to the LAD.  Vein graft to the diagonal, vein graft to the  posterior descending, and a sequential vein graft to the first and  second OM branches of the circumflex.   PAST MEDICAL HISTORY:   ALLERGIES:  No known drug allergies.   MEDICATIONS:  1. Plavix 75.  2. Aspirin 325.  3. Amiodarone 200 b.i.d. (to be decreased today to 200 once a day)  4. Aspirin 325.  5. Also, simvastatin 40 to be started today.   OTHER MEDICAL PROBLEMS:  See the list below.   REVIEW OF SYSTEMS:  He is really doing well.  He has the Wound Care Team  working at one of his vein harvest site.  Otherwise, his review of  systems is negative.   PHYSICAL EXAMINATION:  The patient is here with his wife today.  I  discussed all the aspects of his  care with both of them.  VITAL SIGNS:  Weight is 137 pounds.  Blood pressure is 106/68 with a  pulse of 74.  GENERAL:  The patient is oriented to person, time, and place.  Affect is  normal.  HEENT:  No xanthelasma.  There is normal extraocular motion.  NECK:  There are no carotid bruits.  There is no jugular venous  distention.  LUNGS:  Clear.  Respiratory effort is not labored.  CARDIAC:  S1 with an S2.  There are no clicks or significant murmurs.  His median sternotomy wound is clean.  He has a small bandage over one  of his vein harvest sites.   EKG today reveals no large Q-waves.  Also he has sinus rhythm.  There is  nonspecific ST-T wave changes.   PROBLEMS:  1. History of kidney stones.  2. History  of rotator cuff difficulties and a broken leg.  3. Postoperative atrial fibrillation on amiodarone.  The dose will be      decreased to 200 mg and I will see about stopping it when I see him      back next time.  4. Acute inferior ST elevation, receiving 2 stent for the right      coronary artery.  5. Coronary artery bypass graft, performed by Dr. Laneta Simmers and the      patient is now quite stable.  We discussed full activity for him.      Also, we lowered his amiodarone dose and started him on      simvastatin.  I will see him back in 4 weeks for followup.     Luis Abed, MD, [facility]  Electronically Signed    JDK/MedQ  DD: 08/21/2008  DT: 08/22/2008  Job #: [id number]

## 2011-04-25 NOTE — Assessment & Plan Note (Signed)
OFFICE VISIT   [name], [name] C  DOB:  1941/09/05                                        August 31, 2008  CHART #:  [id number]   The patient returns today for a 3-week followup.  He is status post CABG  x5 on 08/05/2008.  He was seen in our office by Dr. Tyrone Sage on  August 14, 2008, following suture removal of his right [facility] site.  At  that time, he was having soreness and a little bruising, but no  erythema.  Several days after this visit, he developed a superficial  separation of the area with significant bloody drainage.  He was seen in  the office by our nurse and was started on twice daily wet-to-dry  dressing changes to the area.  Since that visit, he has had no  significant erythema and the area appears to be closing well.  They  continued to pack this area twice daily and appears to be decreasing in  size.  He has had no fever.  He has also been seen by Dr. Myrtis Ser since his  last visit to the office.  At that visit, he was started on simvastatin  40 mg daily.  Also since he has been maintaining normal sinus rhythm,  his amiodarone was decreased to 200 mg daily with an eye toward  discontinuation soon, if he remains in sinus.  He is slowly improving  from stamina standpoint as well.  He is walking and is actually  preparing to start cardiac rehabilitation within the next week.  He is  having only minimal shortness of breath with ambulation and no chest  discomfort.   PHYSICAL EXAMINATION:  VITAL SIGNS:  Blood pressure is 116/72, pulse is  82, respirations 18, and O2 sat 97% on room air.  CHEST:  His sternal incision has healed well and his sternum is stable  to palpation.  HEART:  Regular rate and rhythm without murmurs, rubs, or gallops.  LUNGS:  Clear.  EXTREMITIES:  No edema.  His right lower extremity EVH site remains  superficially open, but there is no surrounding erythema and only old  dark sanguineous drainage.  There is one small area  at the distal most  part of the incision, which tracks about 2 cm.  However, the rest of the  wound appears to be granulating well.  I have repacked this area today  and then I have shown his wife how to do the same.   His chest x-ray shows resolved pleural effusions and only minimal  atelectasis.   ASSESSMENT AND PLAN:  The patient is doing well status post coronary  artery bypass graft.  I have asked him to continue with packing of an  endovein harvest site.  He is also encouraged to proceed with cardiac  rehabilitation.  He will return in 2 weeks for a recheck and was seen  Dr. Laneta Simmers at that visit.  He is asked to contact our office if any  further questions or problems arise.   Evelene Croon, M.D.  Electronically Signed   GC/MEDQ  D:  08/31/2008  T:  08/31/2008  Job:  [id number]   cc:   Luis Abed, MD, [facility]

## 2011-04-25 NOTE — Assessment & Plan Note (Signed)
[facility]                            CARDIOLOGY OFFICE NOTE   NAME:[name], [name] FARRELLY                      MRN:          [id number]  DATE:12/15/2008                            DOB:          1940-12-29    [name] is doing well.  He came in with an acute MI and received bare-  metal stents and then eventually underwent CABG.  He had atrial  fibrillation.  He was treated with amiodarone and this was stopped at  the time of the last visit.  He is remaining in sinus rhythm.  He is not  having any significant palpitations.   He has not had any significant chest pain.  He did notice once while  playing golf, in the goal that he had a brief visual disturbance.  This  was very limited.  He has had no recurrence.  I do not think this is  related to his meds at this point.   When I saw him last, we decided to try Niaspan for a mildly reduced HDL.  We had at this to simvastatin, immediately began to have  some leg  aching and itching.  The Niaspan was stopped and his symptoms improved.   PAST MEDICAL HISTORY:   ALLERGIES:  Leg aching and itching from NIASPAN when added to  simvastatin.   MEDICATIONS:  1. Plavix 75.  2. Aspirin 325.  3. Simvastatin 40.   OTHER MEDICAL PROBLEMS:  See the list on the note of September 22, 2008.   REVIEW OF SYSTEMS:  He is not having any GI or GU symptoms.  He has no  fevers or rashes.  There is no headache.  His review of systems is  negative.   PHYSICAL EXAMINATION:  VITAL SIGNS:  Blood pressure is 126/73.  His  pulse of 79.  GENERAL:  The patient is oriented to person, time, and place.  Affect is  normal.  HEENT:  No xanthelasma.  He has normal extraocular motion.  NECK:  There are no carotid bruits.  There is no jugular venous  distention.  LUNGS:  Clear.  Respiratory effort is nonlabored.  CARDIAC:  S1 and S2.  There are no clicks or significant murmurs.  ABDOMEN:  Soft.  There is no peripheral edema.   No labs  were done today.   Problems are listed on the note of September 22, 2008.  He is remaining in  sinus rhythm.  I will continue him on Plavix.  I had a long discussion  with the patient about his exercise program.  When he does various types  of exercise his heart rate this days in the range of approximately 140,  which is in good high level for him.  When he tries to use the  elliptical, his heart rate goes higher.  I told him to slowly increase  his workup in elliptical as his muscle groups are not strained for this  piece of equipment.  We also talked about the addition of fish oil,  which I have encouraged.     Luis Abed, MD, [facility]  Electronically Signed    JDK/MedQ  DD: 12/15/2008  DT: 12/15/2008  Job #: [id number]

## 2011-04-25 NOTE — H&P (Signed)
[name], [name]               ACCOUNT NO.:  [id number]   MEDICAL RECORD NO.:  [id number]          PATIENT TYPE:  INP   LOCATION:  2911                         FACILITY:  [facility]   PHYSICIAN:  Luis Abed, MD, FACCDATE OF BIRTH:  1941-02-04   DATE OF ADMISSION:  08/01/2008  DATE OF DISCHARGE:                              HISTORY & PHYSICAL   SUMMARY OF HISTORY:  Mr. Paskett is a 70 year old white male who at  approximately 9 a.m. while playing golf developed anterior chest  tightness with possible radiation to his right arm.  This was associated  with nausea and diaphoresis.  He continued to play golf, and when the  rain started he and his friend stopped playing golf.  He eventually told  his friends about his discomfort, and he drove to [facility] emergency  room for further evaluation.  There, an EKG showed ST-segment elevation  inferiorly with reciprocal changes anterolaterally.  He was transferred  emergently via [facility] to [facility] catheterization lab.  The patient  denies prior occurrences.   ALLERGIES:  No known drug allergies.   PAST MEDICAL HISTORY:  He is currently not on any prescription  medications.  His history is notable for hyperlipidemia; he thinks his  last check was approximately 1 year ago.  He has a history of kidney  stones for which he has had surgery.  He specifically denies any  problems with diabetes, hypertension, myocardial infarction, CVA, COPD,  bleeding dyscrasias, renal dysfunction, or thyroid disorder.   SOCIAL HISTORY:  He resides in [place] with his wife.  He has 1  daughter.  No grandchildren.  He is employed with [employer] and travels daily.  He denies tobacco, alcohol, drugs, or herbal  medications.  He does not exercise regularly.  He does not follow a  specific diet.   FAMILY HISTORY:  His mother died at the age of 3 with lung cancer, his  father at the age of 2 with leukemia.  He has 1 brother, 35, alive and  well.   REVIEW OF SYSTEMS:  Notable for early morning headaches, dental bridges,  and positive snoring; however he has never been evaluated for sleep  apnea.  He also described a syncopal episode approximately 2 months ago,  specifics unknown.  He has a history of nocturia and rare depression in  addition to the above, otherwise all systems are unremarkable.   BRIEF PHYSICAL EXAMINATION IN CATHETERIZATION LABORATORY:  VITAL SIGNS:  Blood pressure 131/76, pulse 66, respirations 20, and sats 99% on 2 L.  HEENT:  Grossly unremarkable.  NECK:  Supple without thyromegaly, JVD, or carotid bruits.  CHEST:  Symmetrical excursion.  LUNGS:  Clear anterolaterally without any appreciable rales, rhonchi, or  wheezes.  HEART:  PMI is nondisplaced.  Regular rate and rhythm.  I do not  appreciate any murmurs, rubs, clicks, or gallops.  Femoral and pedal  pulses appear to be intact without abdominal or femoral bruits.  SKIN:  Integument appeared to be intact.  ABDOMEN:  Slightly round with bowel sounds present without organomegaly,  masses, or tenderness.  EXTREMITIES:  Negative cyanosis, clubbing, or edema.   Chest x-ray from [facility] is pending at the time of this  dictation.  EKG from [facility] shows normal sinus rhythm, inferior ST-  segment elevation with anterolateral reciprocal changes.  H&H from  [facility] was 15.4 and 44.8, normal indices, platelets 130, and  WBC is 9.2.  Sodium 137, potassium 3.0, BUN 10, creatinine 1.2, and  glucose 136.  PTT 31.6 and PT 12.7.  CK, total MB, and troponin were  within normal limits at presentation at [facility].   IMPRESSION:  1. Inferior ST-elevated myocardial infarction.  2. Hypokalemia.  3. History of hyperlipidemia.  4. History as noted per past medical history.   DISPOSITION:  Dr. Myrtis Ser reviewed the patient's history, spoke with and  examined the patient, and agrees with the above.  Dr. Elease Hashimoto will  proceed with emergent cardiac  catheterization for further evaluation of  his acute myocardial infarction.  Further recommendations will be based  on findings.      Joellyn Rued, PA-C      Luis Abed, MD, [facility]  Electronically Signed    EW/MEDQ  D:  08/01/2008  T:  08/02/2008  Job:  [id number]   cc:   Corwin Levins, MD  Luis Abed, MD, [facility]

## 2011-05-25 ENCOUNTER — Encounter: Payer: Self-pay | Admitting: Cardiology

## 2011-07-13 ENCOUNTER — Encounter: Payer: Self-pay | Admitting: Cardiology

## 2011-07-13 DIAGNOSIS — I4891 Unspecified atrial fibrillation: Secondary | ICD-10-CM | POA: Insufficient documentation

## 2011-07-14 ENCOUNTER — Ambulatory Visit: Payer: Self-pay | Admitting: Cardiology

## 2011-07-17 ENCOUNTER — Encounter: Payer: Self-pay | Admitting: Cardiology

## 2011-07-18 ENCOUNTER — Ambulatory Visit (INDEPENDENT_AMBULATORY_CARE_PROVIDER_SITE_OTHER): Payer: BC Managed Care – PPO | Admitting: Cardiology

## 2011-07-18 ENCOUNTER — Encounter: Payer: Self-pay | Admitting: Cardiology

## 2011-07-18 DIAGNOSIS — I4891 Unspecified atrial fibrillation: Secondary | ICD-10-CM

## 2011-07-18 DIAGNOSIS — D696 Thrombocytopenia, unspecified: Secondary | ICD-10-CM

## 2011-07-18 DIAGNOSIS — I2510 Atherosclerotic heart disease of native coronary artery without angina pectoris: Secondary | ICD-10-CM

## 2011-07-18 DIAGNOSIS — E785 Hyperlipidemia, unspecified: Secondary | ICD-10-CM

## 2011-07-18 NOTE — Assessment & Plan Note (Signed)
The dosing of the patient's simvastatin had been related somewhat to his thrombocytopenia.  Followup lipids will be obtained along with a platelet count.  I will then decide if any changes are in order.

## 2011-07-18 NOTE — Progress Notes (Signed)
HPI Patient is seen today for cardiology followup.  I saw him last August, 2011.  He had percutaneous interventions and then CABG in the past.  His surgery was done in August, 2009.  He had some atrial fib around the time of his surgery he was treated with amiodarone and then this was all resolved.  He is doing well.  He is not having any chest pain.  He is fully active.  As part of the evaluation today I have reviewed my records you updated the current EMR.  He mentions that sometimes he feels a pulsation in his ear at night.  I doubt that this is anything of significance. Allergies  Allergen Reactions  . Niacin     REACTION: leg ache and itching    Current Outpatient Prescriptions  Medication Sig Dispense Refill  . aspirin 325 MG EC tablet Take 325 mg by mouth daily.        . Omega-3 Fatty Acids (FISH OIL) 1200 MG CAPS Take 1 capsule by mouth daily.        . simvastatin (ZOCOR) 40 MG tablet Take 20 mg by mouth daily.          History   Social History  . Marital Status: Married    Spouse Name: N/A    Number of Children: 1  . Years of Education: N/A   Occupational History  . Not on file.   Social History Main Topics  . Smoking status: Never Smoker   . Smokeless tobacco: Not on file  . Alcohol Use: No  . Drug Use: Not on file  . Sexually Active: Not on file   Other Topics Concern  . Not on file   Social History Narrative  . No narrative on file    Family History  Problem Relation Age of Onset  . Leukemia Father   . Lung cancer Mother     Past Medical History  Diagnosis Date  . Increased prostate specific antigen (PSA) velocity   . History of nephrolithiasis   . Prostatitis   . Hyperlipidemia   . CAD (coronary artery disease) 07/2008    2 BMS placed for MI, August, 2009  . Atrial fibrillation     post MI-amiodarone stopped 07/2008  . Anxiety   . Depression   . Benign prostatic hypertrophy   . Thrombocytopenia   . Hx of colonoscopy   . CORONARY ARTERY BYPASS  GRAFT, HX OF 07/13/2009    August, 2009, Dr. Laneta Simmers    Past Surgical History  Procedure Date  . Prostate biopsy 2007    neg  . Rotator cuff repair     bilateral; Dr. Thurston Hole  . Hemorrhoid surgery   . Hydrocele excision   . Coronary artery bypass graft 08/2008  . Cardiac cath     BMS to RCA  . Left leg broken     ROS  Patient denies fever, chills, headache, sweats, rash, change in vision, change in hearing, chest pain, cough, nausea vomiting, urinary symptoms all other systems are reviewed and are negative.  PHYSICAL EXAM Patient is oriented to person time and place.  Affect is normal.  Head is atraumatic.  There is no xanthelasma.  There is no jugular venous distention.  There no carotid bruits.  Lungs are clear.  Respiratory effort is not labored.  Cardiac exam reveals S1-S2.  No clicks or significant murmurs.  Abdomen soft.  There is no peripheral edema.  There no musculoskeletal deformities.There no skin rashes.  Skin  rashes. Filed Vitals:   07/18/11 1124  BP: 119/73  Pulse: 58  Resp: 12  Height: 5\' 3"  (1.6 m)  Weight: 136 lb (61.689 kg)    EKG Is done today and reviewed by me.  It is normal.  There is no change from the past.  ASSESSMENT & PLAN

## 2011-07-18 NOTE — Patient Instructions (Signed)
Your physician recommends that you schedule a follow-up appointment in: 12 months  Your physician recommends that you return for lab work.   

## 2011-07-18 NOTE — Assessment & Plan Note (Signed)
This has been followed.  When he last saw oncology his platelet count was stable.  We will check a platelet count soon.

## 2011-07-18 NOTE — Assessment & Plan Note (Signed)
There has been no recurrent atrial fibrillation.  No further workup.

## 2011-07-18 NOTE — Assessment & Plan Note (Signed)
Coronary disease is stable. No further workup needed. 

## 2011-07-19 ENCOUNTER — Other Ambulatory Visit (INDEPENDENT_AMBULATORY_CARE_PROVIDER_SITE_OTHER): Payer: BC Managed Care – PPO | Admitting: *Deleted

## 2011-07-19 DIAGNOSIS — I2510 Atherosclerotic heart disease of native coronary artery without angina pectoris: Secondary | ICD-10-CM

## 2011-07-19 DIAGNOSIS — D696 Thrombocytopenia, unspecified: Secondary | ICD-10-CM

## 2011-07-19 DIAGNOSIS — E785 Hyperlipidemia, unspecified: Secondary | ICD-10-CM

## 2011-07-19 LAB — CBC WITH DIFFERENTIAL/PLATELET
Basophils Relative: 0.3 % (ref 0.0–3.0)
Eosinophils Relative: 1.4 % (ref 0.0–5.0)
HCT: 44 % (ref 39.0–52.0)
Lymphs Abs: 1.8 10*3/uL (ref 0.7–4.0)
MCV: 91.9 fl (ref 78.0–100.0)
Monocytes Absolute: 0.4 10*3/uL (ref 0.1–1.0)
Neutro Abs: 2.7 10*3/uL (ref 1.4–7.7)
RBC: 4.78 Mil/uL (ref 4.22–5.81)
WBC: 5 10*3/uL (ref 4.5–10.5)

## 2011-11-13 ENCOUNTER — Other Ambulatory Visit: Payer: Self-pay | Admitting: Cardiology

## 2012-08-20 ENCOUNTER — Encounter: Payer: Self-pay | Admitting: Cardiology

## 2012-08-20 ENCOUNTER — Ambulatory Visit (INDEPENDENT_AMBULATORY_CARE_PROVIDER_SITE_OTHER): Payer: BC Managed Care – PPO | Admitting: Cardiology

## 2012-08-20 VITALS — BP 122/64 | HR 57 | Resp 18 | Ht 63.0 in | Wt 135.0 lb

## 2012-08-20 DIAGNOSIS — E785 Hyperlipidemia, unspecified: Secondary | ICD-10-CM

## 2012-08-20 DIAGNOSIS — I4891 Unspecified atrial fibrillation: Secondary | ICD-10-CM

## 2012-08-20 DIAGNOSIS — D696 Thrombocytopenia, unspecified: Secondary | ICD-10-CM

## 2012-08-20 DIAGNOSIS — I2510 Atherosclerotic heart disease of native coronary artery without angina pectoris: Secondary | ICD-10-CM

## 2012-08-20 DIAGNOSIS — R51 Headache: Secondary | ICD-10-CM

## 2012-08-20 NOTE — Assessment & Plan Note (Signed)
When we checked his lipids in August, 2012 his LDL was very good at 61. He needs a fasting lipid profile. In addition we will help him arrange today to have followup with his primary physician.

## 2012-08-20 NOTE — Progress Notes (Signed)
HPI  Patient is seen to followup coronary disease. He 71 years of age. He is very active and he looks quite good. He did have coronary intervention in 2009. First he had percutaneous interventions in in he underwent CABG. He had brief atrophic and after his surgery but has not had recurrent problems since then. I saw him last year in August, 2012 we rechecked his platelet counts it had been low in the past. Platelet count was 114,000. His LDL was 72 at that time it HDL 50. He is fully active and having no significant problems.  Allergies  Allergen Reactions  . Niacin     REACTION: leg ache and itching    Current Outpatient Prescriptions  Medication Sig Dispense Refill  . aspirin 325 MG EC tablet Take 325 mg by mouth daily.        . simvastatin (ZOCOR) 40 MG tablet TAKE ONE-HALF TABLET BY MOUTH EVERY DAY  45 tablet  2  . Omega-3 Fatty Acids (FISH OIL) 1200 MG CAPS Take 1 capsule by mouth daily.          History   Social History  . Marital Status: Married    Spouse Name: N/A    Number of Children: 1  . Years of Education: N/A   Occupational History  . Not on file.   Social History Main Topics  . Smoking status: Never Smoker   . Smokeless tobacco: Not on file  . Alcohol Use: No  . Drug Use: Not on file  . Sexually Active: Not on file   Other Topics Concern  . Not on file   Social History Narrative  . No narrative on file    Family History  Problem Relation Age of Onset  . Leukemia Father   . Lung cancer Mother     Past Medical History  Diagnosis Date  . Increased prostate specific antigen (PSA) velocity   . History of nephrolithiasis   . Prostatitis   . Hyperlipidemia   . CAD (coronary artery disease) 07/2008    2 BMS placed for MI, August, 2009  . Atrial fibrillation     post MI-amiodarone stopped 07/2008  . Anxiety   . Depression   . Benign prostatic hypertrophy   . Thrombocytopenia   . Hx of colonoscopy   . CORONARY ARTERY BYPASS GRAFT, HX OF 07/13/2009      August, 2009, Dr. Laneta Simmers    Past Surgical History  Procedure Date  . Prostate biopsy 2007    neg  . Rotator cuff repair     bilateral; Dr. Thurston Hole  . Hemorrhoid surgery   . Hydrocele excision   . Coronary artery bypass graft 08/2008  . Cardiac cath     BMS to RCA  . Left leg broken     ROS   Patient denies fever, chills, headache, sweats, rash, change in vision, change in hearing, chest pain, cough, nausea vomiting, urinary symptoms. All other systems are reviewed and are negative.  PHYSICAL EXAM  Patient is oriented to person time and place. Affect is normal. There is no jugulovenous distention. Lungs are clear. Respiratory effort is nonlabored. Cardiac exam her vitals S1 and S2. There are no clicks or significant murmurs. The abdomen is soft. There is no peripheral edema. There are no musculoskeletal deformities. There are no skin rashes.  Filed Vitals:   08/20/12 0937  BP: 122/64  Pulse: 57  Resp: 18  Height: 5\' 3"  (1.6 m)  Weight: 135 lb (61.236 kg)  SpO2: 98%   EKG is done today and reviewed by me. I have compared to the prior tracing. He has normal sinus rhythm with very mild sinus bradycardia. There is mild decreased R wave in V2. This is unchanged.  ASSESSMENT & PLAN

## 2012-08-20 NOTE — Assessment & Plan Note (Signed)
I had rechecked his platelets last year in August. The platelet count was 114,000. No further workup is needed.

## 2012-08-20 NOTE — Assessment & Plan Note (Signed)
He has had some mild headaches. However he feels that this is when he becomes mildly dehydrated when exercising. He's done a better job of remaining hydrated and he is feeling better.

## 2012-08-20 NOTE — Patient Instructions (Addendum)
Your physician recommends that you return for lab work in: September 27  Your physician wants you to follow-up in: 1 year. You will receive a reminder letter in the mail two months in advance. If you don't receive a letter, please call our office to schedule the follow-up appointment.  You have appointment with Dr. [name] on 9/27 @ 9:30.

## 2012-08-20 NOTE — Assessment & Plan Note (Signed)
Patient is stable and active. He exercises regularly. He's not having any symptoms. He does not need an exercise test as of this year.

## 2012-08-20 NOTE — Assessment & Plan Note (Signed)
He has had no recurrent symptoms of tachycardia arrhythmias. There is no evidence of return of atrial fibrillation.

## 2012-08-22 ENCOUNTER — Ambulatory Visit: Payer: BC Managed Care – PPO | Admitting: Internal Medicine

## 2012-08-30 ENCOUNTER — Encounter: Payer: Self-pay | Admitting: Internal Medicine

## 2012-08-30 DIAGNOSIS — Z0000 Encounter for general adult medical examination without abnormal findings: Secondary | ICD-10-CM | POA: Insufficient documentation

## 2012-09-06 ENCOUNTER — Encounter: Payer: Self-pay | Admitting: Internal Medicine

## 2012-09-06 ENCOUNTER — Other Ambulatory Visit (INDEPENDENT_AMBULATORY_CARE_PROVIDER_SITE_OTHER): Payer: BC Managed Care – PPO

## 2012-09-06 ENCOUNTER — Other Ambulatory Visit: Payer: Self-pay | Admitting: Internal Medicine

## 2012-09-06 ENCOUNTER — Ambulatory Visit (INDEPENDENT_AMBULATORY_CARE_PROVIDER_SITE_OTHER): Payer: BC Managed Care – PPO | Admitting: Internal Medicine

## 2012-09-06 VITALS — BP 130/80 | HR 68 | Temp 97.8°F | Ht 63.0 in | Wt 132.1 lb

## 2012-09-06 DIAGNOSIS — Z0000 Encounter for general adult medical examination without abnormal findings: Secondary | ICD-10-CM

## 2012-09-06 DIAGNOSIS — R972 Elevated prostate specific antigen [PSA]: Secondary | ICD-10-CM

## 2012-09-06 HISTORY — DX: Elevated prostate specific antigen (PSA): R97.20

## 2012-09-06 LAB — HEPATIC FUNCTION PANEL
Alkaline Phosphatase: 42 U/L (ref 39–117)
Bilirubin, Direct: 0.2 mg/dL (ref 0.0–0.3)
Total Protein: 7.2 g/dL (ref 6.0–8.3)

## 2012-09-06 LAB — CBC WITH DIFFERENTIAL/PLATELET
Basophils Absolute: 0 10*3/uL (ref 0.0–0.1)
Eosinophils Absolute: 0.1 10*3/uL (ref 0.0–0.7)
HCT: 45.9 % (ref 39.0–52.0)
Hemoglobin: 15.1 g/dL (ref 13.0–17.0)
Lymphocytes Relative: 17.9 % (ref 12.0–46.0)
Lymphs Abs: 1.6 10*3/uL (ref 0.7–4.0)
MCHC: 33 g/dL (ref 30.0–36.0)
Monocytes Absolute: 0.6 10*3/uL (ref 0.1–1.0)
Neutro Abs: 6.6 10*3/uL (ref 1.4–7.7)
RDW: 12.6 % (ref 11.5–14.6)

## 2012-09-06 LAB — LIPID PANEL
HDL: 48.3 mg/dL (ref >39.00)
Total CHOL/HDL Ratio: 3

## 2012-09-06 LAB — URINALYSIS, ROUTINE W REFLEX MICROSCOPIC
Bilirubin Urine: NEGATIVE
Leukocytes, UA: NEGATIVE
Urobilinogen, UA: 0.2 (ref 0.0–1.0)

## 2012-09-06 LAB — BASIC METABOLIC PANEL
CO2: 30 mEq/L (ref 19–32)
Calcium: 9.6 mg/dL (ref 8.4–10.5)
Sodium: 140 mEq/L (ref 135–145)

## 2012-09-06 MED ORDER — SIMVASTATIN 40 MG PO TABS: 20.0000 mg | ORAL_TABLET | Freq: Every day | ORAL | Status: DC

## 2012-09-06 MED ORDER — ASPIRIN 81 MG PO TBEC: 81.0000 mg | DELAYED_RELEASE_TABLET | Freq: Every day | ORAL | Status: AC

## 2012-09-06 NOTE — Assessment & Plan Note (Signed)
Overall doing well, age appropriate education and counseling updated, referrals for preventative services and immunizations addressed, dietary and smoking counseling addressed, most recent labs and ECG reviewed.  I have personally reviewed and have noted: 1) the patient's medical and social history 2) The pt's use of alcohol, tobacco, and illicit drugs 3) The patient's current medications and supplements 4) Functional ability including ADL's, fall risk, home safety risk, hearing and visual impairment 5) Diet and physical activities 6) Evidence for depression or mood disorder 7) The patient's height, weight, and BMI have been recorded in the chart I have made referrals, and provided counseling and education based on review of the above For colonscopy as he is due, and routine labs; ok to decrease the asa to 81 mg

## 2012-09-06 NOTE — Progress Notes (Signed)
Subjective:    Patient ID: [name], male    DOB: July 24, 1941, 71 y.o.   MRN: [id number]  HPI  Here for wellness and f/u;  Overall doing ok;  Pt denies CP, worsening SOB, DOE, wheezing, orthopnea, PND, worsening LE edema, palpitations, dizziness or syncope.  Pt denies neurological change such as new Headache, facial or extremity weakness.  Pt denies polydipsia, polyuria, or low sugar symptoms. Pt states overall good compliance with treatment and medications, good tolerability, and trying to follow lower cholesterol diet.  Pt denies worsening depressive symptoms, suicidal ideation or panic. No fever, wt loss, night sweats, loss of appetite, or other constitutional symptoms.  Pt states good ability with ADL's, low fall risk, home safety reviewed and adequate, no significant changes in hearing or vision, and occasionally active with exercise.  Still working full time since he enjoys it, works for [employer] full time.  Has chronic low mild TCP due to simvastatin per pt per heme, and zocor halved since last yr.   Past Medical History  Diagnosis Date  . Increased prostate specific antigen (PSA) velocity   . History of nephrolithiasis   . Prostatitis   . Hyperlipidemia   . CAD (coronary artery disease) 07/2008    2 BMS placed for MI, August, 2009  . Atrial fibrillation     post MI-amiodarone stopped 07/2008  . Anxiety   . Depression   . Benign prostatic hypertrophy   . Thrombocytopenia   . Hx of colonoscopy   . CORONARY ARTERY BYPASS GRAFT, HX OF 07/13/2009    August, 2009, Dr. Laneta Simmers  . HYPERLIPIDEMIA 02/09/2009    Qualifier: Diagnosis of  By: [name] MD, Len Blalock   . NEPHROLITHIASIS, HX OF 02/09/2009    Qualifier: Diagnosis of  By: [name] MD, Len Blalock    Past Surgical History  Procedure Date  . Prostate biopsy 2007    neg  . Rotator cuff repair     bilateral; Dr. Thurston Hole  . Hemorrhoid surgery   . Hydrocele excision   . Coronary artery bypass graft 08/2008  . Cardiac cath     BMS to RCA  .  Left leg broken     reports that he has never smoked. He has never used smokeless tobacco. He reports that he does not drink alcohol. His drug history not on file. family history includes Leukemia in his father and Lung cancer in his mother. Allergies  Allergen Reactions  . Niacin     REACTION: leg ache and itching   Current Outpatient Prescriptions on File Prior to Visit  Medication Sig Dispense Refill  . aspirin 325 MG EC tablet Take 325 mg by mouth daily.        . simvastatin (ZOCOR) 40 MG tablet TAKE ONE-HALF TABLET BY MOUTH EVERY DAY  45 tablet  2   Review of Systems Review of Systems  Constitutional: Negative for diaphoresis, activity change, appetite change and unexpected weight change.  HENT: Negative for hearing loss, ear pain, facial swelling, mouth sores and neck stiffness.   Eyes: Negative for pain, redness and visual disturbance.  Respiratory: Negative for shortness of breath and wheezing.   Cardiovascular: Negative for chest pain and palpitations.  Gastrointestinal: Negative for diarrhea, blood in stool, abdominal distention and rectal pain.  Genitourinary: Negative for hematuria, flank pain and decreased urine volume.  Musculoskeletal: Negative for myalgias and joint swelling.  Skin: Negative for color change and wound.  Neurological: Negative for syncope and numbness.  Hematological: Negative  for adenopathy.  Psychiatric/Behavioral: Negative for hallucinations, self-injury, decreased concentration and agitation.      Objective:   Physical Exam BP 130/80  Pulse 68  Temp 97.8 F (36.6 C) (Oral)  Ht 5\' 3"  (1.6 m)  Wt 132 lb 2 oz (59.932 kg)  BMI 23.41 kg/m2  SpO2 97% Physical Exam  VS noted Constitutional: Pt is oriented to person, place, and time. Appears well-developed and well-nourished.  HENT:  Head: Normocephalic and atraumatic.  Right Ear: External ear normal.  Left Ear: External ear normal.  Nose: Nose normal.  Mouth/Throat: Oropharynx is clear and  moist.  Eyes: Conjunctivae and EOM are normal. Pupils are equal, round, and reactive to light.  Neck: Normal range of motion. Neck supple. No JVD present. No tracheal deviation present.  Cardiovascular: Normal rate, regular rhythm, normal heart sounds and intact distal pulses.   Pulmonary/Chest: Effort normal and breath sounds normal.  Abdominal: Soft. Bowel sounds are normal. There is no tenderness.  Musculoskeletal: Normal range of motion. Exhibits no edema.  Lymphadenopathy:  Has no cervical adenopathy.  Neurological: Pt is alert and oriented to person, place, and time. Pt has normal reflexes. No cranial nerve deficit. Motor/dtr/gait intact Skin: Skin is warm and dry. No rash noted.  Psychiatric:  Has  normal mood and affect. Behavior is normal.     Assessment & Plan:

## 2012-09-06 NOTE — Patient Instructions (Addendum)
Please remember the flu shot to be done at your workplace You will be contacted regarding the referral for: colonoscopy Please go to LAB in the Basement for the blood and/or urine tests to be done today You will be contacted by phone if any changes need to be made immediately.  Otherwise, you will receive a letter about your results with an explanation. Please remember to sign up for My Chart at your earliest convenience, as this will be important to you in the future with finding out test results. Please keep your appointments with your specialists as you have planned - Dr Myrtis Ser OK to change the Aspirin from 325 to 81 mg per day - but still needs to be Enteric Coated Please return in 1 year for your yearly visit, or sooner if needed, with Lab testing done 3-5 days before

## 2012-09-11 ENCOUNTER — Encounter: Payer: Self-pay | Admitting: Gastroenterology

## 2012-09-26 ENCOUNTER — Ambulatory Visit (AMBULATORY_SURGERY_CENTER): Payer: BC Managed Care – PPO | Admitting: *Deleted

## 2012-09-26 VITALS — Ht 65.5 in | Wt 135.5 lb

## 2012-09-26 DIAGNOSIS — Z1211 Encounter for screening for malignant neoplasm of colon: Secondary | ICD-10-CM

## 2012-09-26 MED ORDER — NA SULFATE-K SULFATE-MG SULF 17.5-3.13-1.6 GM/177ML PO SOLN
1.0000 | Freq: Once | ORAL | Status: DC
Start: 2012-09-26 — End: 2012-10-10

## 2012-10-04 ENCOUNTER — Encounter: Payer: Self-pay | Admitting: Gastroenterology

## 2012-10-10 ENCOUNTER — Ambulatory Visit (AMBULATORY_SURGERY_CENTER): Payer: BC Managed Care – PPO | Admitting: Gastroenterology

## 2012-10-10 ENCOUNTER — Encounter: Payer: Self-pay | Admitting: Gastroenterology

## 2012-10-10 VITALS — BP 107/61 | HR 58 | Temp 97.6°F | Resp 16 | Ht 65.0 in | Wt 135.0 lb

## 2012-10-10 DIAGNOSIS — Z1211 Encounter for screening for malignant neoplasm of colon: Secondary | ICD-10-CM

## 2012-10-10 DIAGNOSIS — D126 Benign neoplasm of colon, unspecified: Secondary | ICD-10-CM

## 2012-10-10 DIAGNOSIS — K5730 Diverticulosis of large intestine without perforation or abscess without bleeding: Secondary | ICD-10-CM

## 2012-10-10 MED ORDER — SODIUM CHLORIDE 0.9 % IV SOLN: 500.0000 mL | INTRAVENOUS | Status: DC

## 2012-10-10 NOTE — Patient Instructions (Addendum)

## 2012-10-10 NOTE — Progress Notes (Signed)
Patient did not experience any of the following events: a burn prior to discharge; a fall within the facility; wrong site/side/patient/procedure/implant event; or a hospital transfer or hospital admission upon discharge from the facility. (G8907) Patient did not have preoperative order for IV antibiotic SSI prophylaxis. (G8918)  

## 2012-10-10 NOTE — Op Note (Signed)
[facility] [address] [place], [address]   COLONOSCOPY PROCEDURE REPORT  PATIENT: [name], [name]  MR#: [id number] BIRTHDATE: Jul 23, 1941 , 71  yrs. old GENDER: Male ENDOSCOPIST: Louis Meckel, MD REFERRED XB:JYNWG John, M.D. PROCEDURE DATE:  10/10/2012 PROCEDURE:   Colonoscopy with snare polypectomy ASA CLASS:   Class II INDICATIONS:average risk screening. MEDICATIONS: MAC sedation, administered by CRNA and propofol (Diprivan) 200mg  IV  DESCRIPTION OF PROCEDURE:   After the risks benefits and alternatives of the procedure were thoroughly explained, informed consent was obtained.  A digital rectal exam revealed no abnormalities of the rectum.   The LB [id number]  endoscope was introduced through the anus and advanced to the cecum, which was identified by both the appendix and ileocecal valve. No adverse events experienced.   The quality of the prep was Suprep excellent The instrument was then slowly withdrawn as the colon was fully examined.      COLON FINDINGS: A sessile polyp was found in the proximal transverse colon.  A polypectomy was performed using snare cautery.  The resection was complete and the polyp tissue was completely retrieved.   Moderate diverticulosis was noted in the sigmoid colon.   The colon mucosa was otherwise normal.  Retroflexed views revealed no abnormalities. The time to cecum=2 minutes 58 seconds. Withdrawal time=7 minutes 23 seconds.  The scope was withdrawn and the procedure completed. COMPLICATIONS: There were no complications.  ENDOSCOPIC IMPRESSION: 1.   Sessile polyp was found in the proximal transverse colon; polypectomy was performed using snare cautery 2.   Moderate diverticulosis was noted in the sigmoid colon 3.   The colon mucosa was otherwise normal  RECOMMENDATIONS: If the polyp(s) removed today are proven to be adenomatous (pre-cancerous) polyps, you will need a repeat colonoscopy in 5 years.   Otherwise you should continue to follow colorectal cancer screening guidelines for "routine risk" patients with colonoscopy in 10 years.  You will receive a letter within 1-2 weeks with the results of your biopsy as well as final recommendations.  Please call my office if you have not received a letter after 3 weeks.   eSigned:  Louis Meckel, MD 10/10/2012 10:19 AM   cc:

## 2012-10-11 ENCOUNTER — Telehealth: Payer: Self-pay

## 2012-10-11 NOTE — Telephone Encounter (Signed)
  Follow up Call-  Call back number 10/10/2012  Post procedure Call Back phone  # [phone]  Permission to leave phone message Yes     Patient questions:  Do you have a fever, pain , or abdominal swelling? yes Pain Score  0 *  Have you tolerated food without any problems? yes  Have you been able to return to your normal activities? yes  Do you have any questions about your discharge instructions: Diet   no Medications  no Follow up visit  no  Do you have questions or concerns about your Care? no  Actions: * If pain score is 4 or above: No action needed, pain <4.

## 2012-10-17 ENCOUNTER — Encounter: Payer: Self-pay | Admitting: Gastroenterology

## 2013-08-18 ENCOUNTER — Encounter: Payer: Self-pay | Admitting: Cardiology

## 2013-08-18 DIAGNOSIS — R9430 Abnormal result of cardiovascular function study, unspecified: Secondary | ICD-10-CM | POA: Insufficient documentation

## 2013-08-20 ENCOUNTER — Ambulatory Visit (INDEPENDENT_AMBULATORY_CARE_PROVIDER_SITE_OTHER): Payer: Federal, State, Local not specified - PPO | Admitting: Cardiology

## 2013-08-20 ENCOUNTER — Encounter: Payer: Self-pay | Admitting: Cardiology

## 2013-08-20 VITALS — BP 122/62 | HR 62 | Ht 65.0 in | Wt 133.0 lb

## 2013-08-20 DIAGNOSIS — Z951 Presence of aortocoronary bypass graft: Secondary | ICD-10-CM | POA: Insufficient documentation

## 2013-08-20 DIAGNOSIS — R0989 Other specified symptoms and signs involving the circulatory and respiratory systems: Secondary | ICD-10-CM

## 2013-08-20 DIAGNOSIS — I2510 Atherosclerotic heart disease of native coronary artery without angina pectoris: Secondary | ICD-10-CM

## 2013-08-20 DIAGNOSIS — I4891 Unspecified atrial fibrillation: Secondary | ICD-10-CM

## 2013-08-20 DIAGNOSIS — D696 Thrombocytopenia, unspecified: Secondary | ICD-10-CM

## 2013-08-20 DIAGNOSIS — E785 Hyperlipidemia, unspecified: Secondary | ICD-10-CM

## 2013-08-20 DIAGNOSIS — R9430 Abnormal result of cardiovascular function study, unspecified: Secondary | ICD-10-CM

## 2013-08-20 NOTE — Assessment & Plan Note (Signed)
Will obtain a lipid profile. There was question that higher dose simvastatin had affected his platelets. I have reviewed the guidelines with him. He and I both agreed that it would not be prudent to change his current cholesterol medications unless we see a problem with his LDL.

## 2013-08-20 NOTE — Progress Notes (Signed)
HPI  Patient is seen today for followup coronary artery disease and hyperlipidemia. He is doing very well. He underwent bypass surgery in 2009. He's not having any symptoms. This is a yearly followup. I have reviewed the records carefully concerning any testing that we've gone over time. He has not had an echo or nuclear exercise study since his surgery in 2009. In addition he has not had a carotid Doppler since then. He has had careful followup of his lipids.  He plays golf frequently and does quite well.  Allergies  Allergen Reactions  . Niacin     REACTION: leg ache and itching    Current Outpatient Prescriptions  Medication Sig Dispense Refill  . aspirin 81 MG EC tablet Take 1 tablet (81 mg total) by mouth daily. Swallow whole.  30 tablet  12  . Multiple Vitamin (MULTIVITAMIN) tablet Take 1 tablet by mouth daily.      . simvastatin (ZOCOR) 40 MG tablet Take 0.5 tablets (20 mg total) by mouth at bedtime.  45 tablet  3   No current facility-administered medications for this visit.    History   Social History  . Marital Status: Married    Spouse Name: N/A    Number of Children: 1  . Years of Education: N/A   Occupational History  . Not on file.   Social History Main Topics  . Smoking status: Never Smoker   . Smokeless tobacco: Never Used  . Alcohol Use: No  . Drug Use: No  . Sexual Activity: Not on file   Other Topics Concern  . Not on file   Social History Narrative  . No narrative on file    Family History  Problem Relation Age of Onset  . Leukemia Father   . Lung cancer Mother   . Colon cancer Neg Hx   . Rectal cancer Neg Hx   . Stomach cancer Neg Hx     Past Medical History  Diagnosis Date  . Increased prostate specific antigen (PSA) velocity   . History of nephrolithiasis   . Prostatitis   . Hyperlipidemia   . CAD (coronary artery disease) 07/2008    2 BMS placed for MI, August, 2009  . Atrial fibrillation     post MI-amiodarone stopped 07/2008    . Anxiety   . Depression   . Benign prostatic hypertrophy   . Thrombocytopenia   . Hx of colonoscopy   . CORONARY ARTERY BYPASS GRAFT, HX OF 07/13/2009    August, 2009, Dr. Laneta Simmers  . HYPERLIPIDEMIA 02/09/2009    Qualifier: Diagnosis of  By: [name] MD, Len Blalock   . NEPHROLITHIASIS, HX OF 02/09/2009    Qualifier: Diagnosis of  By: [name] MD, Len Blalock   . PSA elevation 09/06/2012  . Ejection fraction     .  Marland Kitchen Hx of CABG     Past Surgical History  Procedure Laterality Date  . Prostate biopsy  2007    neg  . Rotator cuff repair      bilateral; Dr. Thurston Hole  . Hemorrhoid surgery    . Hydrocele excision    . Coronary artery bypass graft  08/2008  . Cardiac cath      BMS to RCA  . Left leg broken      Patient Active Problem List   Diagnosis Date Noted  . Hx of CABG   . Ejection fraction   . PSA elevation 09/06/2012  . Preventative health care 08/30/2012  . Atrial fibrillation   .  THROMBOCYTOPENIA 07/26/2009  . Headache 07/15/2009  . HYPERLIPIDEMIA 02/09/2009  . ANXIETY 02/09/2009  . DEPRESSION 02/09/2009  . BENIGN PROSTATIC HYPERTROPHY 02/09/2009  . PSA, INCREASED 02/09/2009  . NEPHROLITHIASIS, HX OF 02/09/2009  . CAD (coronary artery disease) 07/11/2008    ROS   Patient denies fever, chills, headache, sweats, rash, change in vision, change in hearing, chest pain, cough, nausea vomiting, urinary symptoms. All other systems are reviewed and are negative.  PHYSICAL EXAM  Patient is oriented to person time and place. Affect is normal. There is no jugulovenous distention. There is question of a soft right carotid bruit. Lungs are clear. Respiratory effort is nonlabored. Cardiac exam reveals S1 and S2. There are no clicks or significant murmurs. The abdomen is soft. There is no peripheral edema. There are no musculoskeletal deformities. There are no skin rashes.  Filed Vitals:   08/20/13 0926  BP: 122/62  Pulse: 62  Height: 5\' 5"  (1.651 m)  Weight: 133 lb (60.328 kg)   EKG is done  today and reviewed by me. There is normal sinus rhythm. There is no significant abnormality. There is no significant change when compared to the prior tracing.  ASSESSMENT & PLAN

## 2013-08-20 NOTE — Patient Instructions (Addendum)
**Note De-Identified [name] Obfuscation** Your physician has requested that you have an echocardiogram. Echocardiography is a painless test that uses sound waves to create images of your heart. It provides your doctor with information about the size and shape of your heart and how well your heart's chambers and valves are working. This procedure takes approximately one hour. There are no restrictions for this procedure.  Your physician has requested that you have a carotid duplex. This test is an ultrasound of the carotid arteries in your neck. It looks at blood flow through these arteries that supply the brain with blood. Allow one hour for this exam. There are no restrictions or special instructions.  Your physician wants you to follow-up in: 1 year. You will receive a reminder letter in the mail two months in advance. If you don't receive a letter, please call our office to schedule the follow-up appointment.

## 2013-08-20 NOTE — Assessment & Plan Note (Signed)
Patient had 2 bare-metal stents placed in August, 2009. He also underwent bypass surgery at that time. He's not having any significant symptoms. We need to reassess his LV function at this time. Two-dimensional echo will be ordered.  Patient also has known vascular disease with his coronary disease. He's not had a carotid Doppler since 2009. There is question of a soft right carotid bruit. Carotid Doppler will be obtained.

## 2013-08-20 NOTE — Assessment & Plan Note (Signed)
The patient had some atrial fibrillation after his surgery in 2009. He received amiodarone for a period of time. He's had no significant recurrence. He has sinus rhythm. No further workup is needed.

## 2013-08-20 NOTE — Assessment & Plan Note (Signed)
Historically he had good LV function but there was some inferobasilar hypokinesis in 2009. I had a careful discussion with him about the need to reassess his LV function.

## 2013-08-20 NOTE — Assessment & Plan Note (Signed)
This problem had stabilize. There was thought that it may have been related to the dose of his simvastatin. We will not change his simvastatin at this time.  As part of today's evaluation I spent greater than 25 minutes with is total care. More than half of this time is been spent with direct discussion with him about his cholesterol medications and the need for further testing this year.

## 2013-08-25 ENCOUNTER — Encounter (INDEPENDENT_AMBULATORY_CARE_PROVIDER_SITE_OTHER): Payer: Federal, State, Local not specified - PPO

## 2013-08-25 DIAGNOSIS — I6529 Occlusion and stenosis of unspecified carotid artery: Secondary | ICD-10-CM | POA: Diagnosis not present

## 2013-08-25 DIAGNOSIS — H43819 Vitreous degeneration, unspecified eye: Secondary | ICD-10-CM | POA: Diagnosis not present

## 2013-08-25 DIAGNOSIS — I2510 Atherosclerotic heart disease of native coronary artery without angina pectoris: Secondary | ICD-10-CM

## 2013-08-27 ENCOUNTER — Encounter: Payer: Self-pay | Admitting: Cardiology

## 2013-08-27 DIAGNOSIS — I739 Peripheral vascular disease, unspecified: Secondary | ICD-10-CM

## 2013-08-27 DIAGNOSIS — I779 Disorder of arteries and arterioles, unspecified: Secondary | ICD-10-CM | POA: Insufficient documentation

## 2013-08-28 DIAGNOSIS — H43819 Vitreous degeneration, unspecified eye: Secondary | ICD-10-CM | POA: Diagnosis not present

## 2013-09-01 ENCOUNTER — Telehealth: Payer: Self-pay | Admitting: Cardiology

## 2013-09-01 NOTE — Telephone Encounter (Signed)
New Problem  Pt states he is returning Lynn's phone call.

## 2013-09-01 NOTE — Telephone Encounter (Signed)
Pt given Carotid Doppler results, he verbalized understanding.

## 2013-09-02 ENCOUNTER — Ambulatory Visit (HOSPITAL_COMMUNITY): Payer: Federal, State, Local not specified - PPO | Attending: Cardiology | Admitting: Radiology

## 2013-09-02 ENCOUNTER — Other Ambulatory Visit (HOSPITAL_COMMUNITY): Payer: BC Managed Care – PPO

## 2013-09-02 DIAGNOSIS — I4891 Unspecified atrial fibrillation: Secondary | ICD-10-CM

## 2013-09-02 DIAGNOSIS — I2510 Atherosclerotic heart disease of native coronary artery without angina pectoris: Secondary | ICD-10-CM | POA: Diagnosis not present

## 2013-09-02 DIAGNOSIS — I080 Rheumatic disorders of both mitral and aortic valves: Secondary | ICD-10-CM | POA: Diagnosis not present

## 2013-09-02 DIAGNOSIS — I252 Old myocardial infarction: Secondary | ICD-10-CM | POA: Diagnosis not present

## 2013-09-02 DIAGNOSIS — E785 Hyperlipidemia, unspecified: Secondary | ICD-10-CM | POA: Insufficient documentation

## 2013-09-02 DIAGNOSIS — R9430 Abnormal result of cardiovascular function study, unspecified: Secondary | ICD-10-CM

## 2013-09-02 NOTE — Progress Notes (Signed)
Echocardiogram performed.  

## 2013-09-05 ENCOUNTER — Telehealth: Payer: Self-pay | Admitting: Cardiology

## 2013-09-05 ENCOUNTER — Encounter: Payer: Self-pay | Admitting: Cardiology

## 2013-09-05 DIAGNOSIS — I340 Nonrheumatic mitral (valve) insufficiency: Secondary | ICD-10-CM | POA: Insufficient documentation

## 2013-09-05 NOTE — Telephone Encounter (Signed)
Pt given results, he verbalized understanding. 

## 2013-09-05 NOTE — Telephone Encounter (Signed)
Follow UP:  Pt states he is returning Lynn's call.

## 2013-09-09 ENCOUNTER — Other Ambulatory Visit: Payer: Self-pay | Admitting: Internal Medicine

## 2013-09-12 ENCOUNTER — Ambulatory Visit (INDEPENDENT_AMBULATORY_CARE_PROVIDER_SITE_OTHER): Payer: Federal, State, Local not specified - PPO | Admitting: Internal Medicine

## 2013-09-12 ENCOUNTER — Other Ambulatory Visit (INDEPENDENT_AMBULATORY_CARE_PROVIDER_SITE_OTHER): Payer: Federal, State, Local not specified - PPO

## 2013-09-12 ENCOUNTER — Encounter: Payer: Self-pay | Admitting: Internal Medicine

## 2013-09-12 VITALS — BP 130/70 | HR 60 | Temp 98.0°F | Ht 64.0 in | Wt 135.4 lb

## 2013-09-12 DIAGNOSIS — F329 Major depressive disorder, single episode, unspecified: Secondary | ICD-10-CM

## 2013-09-12 DIAGNOSIS — Z0000 Encounter for general adult medical examination without abnormal findings: Secondary | ICD-10-CM | POA: Diagnosis not present

## 2013-09-12 DIAGNOSIS — F411 Generalized anxiety disorder: Secondary | ICD-10-CM

## 2013-09-12 DIAGNOSIS — E785 Hyperlipidemia, unspecified: Secondary | ICD-10-CM | POA: Diagnosis not present

## 2013-09-12 DIAGNOSIS — R972 Elevated prostate specific antigen [PSA]: Secondary | ICD-10-CM | POA: Diagnosis not present

## 2013-09-12 DIAGNOSIS — Z23 Encounter for immunization: Secondary | ICD-10-CM

## 2013-09-12 DIAGNOSIS — F3289 Other specified depressive episodes: Secondary | ICD-10-CM

## 2013-09-12 LAB — BASIC METABOLIC PANEL
Calcium: 9.4 mg/dL (ref 8.4–10.5)
GFR: 70.76 mL/min (ref >60.00)
Sodium: 139 mEq/L (ref 135–145)

## 2013-09-12 LAB — CBC WITH DIFFERENTIAL/PLATELET
Basophils Absolute: 0 10*3/uL (ref 0.0–0.1)
Eosinophils Relative: 1.6 % (ref 0.0–5.0)
HCT: 43.4 % (ref 39.0–52.0)
Hemoglobin: 14.8 g/dL (ref 13.0–17.0)
Lymphs Abs: 2 10*3/uL (ref 0.7–4.0)
Monocytes Relative: 10.3 % (ref 3.0–12.0)
Neutro Abs: 3.3 10*3/uL (ref 1.4–7.7)
Neutrophils Relative %: 54.2 % (ref 43.0–77.0)
RDW: 12.1 % (ref 11.5–14.6)

## 2013-09-12 LAB — URINALYSIS, ROUTINE W REFLEX MICROSCOPIC
Bilirubin Urine: NEGATIVE
Nitrite: NEGATIVE
RBC / HPF: NONE SEEN (ref 0–?)
Specific Gravity, Urine: <=1.005 (ref 1.000–1.030)
Total Protein, Urine: NEGATIVE
WBC, UA: NONE SEEN (ref 0–?)

## 2013-09-12 LAB — LIPID PANEL
Cholesterol: 137 mg/dL (ref 0–200)
HDL: 46.1 mg/dL (ref >39.00)
Triglycerides: 73 mg/dL (ref 0.0–149.0)

## 2013-09-12 LAB — HEPATIC FUNCTION PANEL
ALT: 23 U/L (ref 0–53)
AST: 33 U/L (ref 0–37)
Albumin: 4.2 g/dL (ref 3.5–5.2)
Total Protein: 7 g/dL (ref 6.0–8.3)

## 2013-09-12 LAB — TSH: TSH: 1.44 u[IU]/mL (ref 0.35–5.50)

## 2013-09-12 LAB — PSA: PSA: 5.26 ng/mL — ABNORMAL HIGH (ref 0.10–4.00)

## 2013-09-12 NOTE — Assessment & Plan Note (Signed)
Also for f/u psa per pt request 

## 2013-09-12 NOTE — Assessment & Plan Note (Addendum)
Mild worsening recently, declines further tx such as SSRI or need for counseling  Note:  Total time for pt hx, exam, review of record with pt in the room, determination of diagnoses and plan for further eval and tx is > 40 min, with over 50% spent in coordination and counseling of patient

## 2013-09-12 NOTE — Addendum Note (Signed)
Addended by: Scharlene Gloss B on: 09/12/2013 09:35 AM   Modules accepted: Orders

## 2013-09-12 NOTE — Assessment & Plan Note (Signed)
stable overall by history and exam, recent data reviewed with pt, and pt to continue medical treatment as before,  to f/u any worsening symptoms or concerns Lab Results  Component Value Date   WBC 8.9 09/06/2012   HGB 15.1 09/06/2012   HCT 45.9 09/06/2012   PLT 121.0* 09/06/2012   GLUCOSE 101* 09/06/2012   CHOL 141 09/06/2012   TRIG 105.0 09/06/2012   HDL 48.30 09/06/2012   LDLCALC 72 09/06/2012   ALT 20 09/06/2012   AST 32 09/06/2012   NA 140 09/06/2012   K 4.4 09/06/2012   CL 103 09/06/2012   CREATININE 1.1 09/06/2012   BUN 14 09/06/2012   CO2 30 09/06/2012   TSH 1.65 09/06/2012   PSA 5.96* 09/06/2012   HGBA1C 5.7 02/04/2009

## 2013-09-12 NOTE — Patient Instructions (Signed)
You had the flu shot today, and the pneumonia shot Please continue all other medications as before, and refills have been done if requested. Please have the pharmacy call with any other refills you may need. Please continue your efforts at being more active, low cholesterol diet, and weight control. You are otherwise up to date with prevention measures today. Please go to the LAB in the Basement (turn left off the elevator) for the tests to be done today You will be contacted by phone if any changes need to be made immediately.  Otherwise, you will receive a letter about your results with an explanation, but please check with MyChart first.  Please keep your appointments with your specialists as you have planned - urology We will try to forward the labs results to Dr Annabell Howells as well  Please return in 1 year for your yearly visit, or sooner if needed

## 2013-09-12 NOTE — Progress Notes (Signed)
Subjective:    Patient ID: [name], male    DOB: 1941/04/18, 72 y.o.   MRN: [id number]  HPI   Here for yearly f/u;  Overall doing ok;  Pt denies CP, worsening SOB, DOE, wheezing, orthopnea, PND, worsening LE edema, palpitations, dizziness or syncope.  Pt denies neurological change such as new headache, facial or extremity weakness.  Pt denies polydipsia, polyuria, or low sugar symptoms. Pt states overall good compliance with treatment and medications, good tolerability, and has been trying to follow lower cholesterol diet.  Pt denies worsening depressive symptoms, suicidal ideation or panic, though mentions worsening overall anxiety over getting older and fearing further infirmity. No fever, night sweats, wt loss, loss of appetite, or other constitutional symptoms.  Pt states good ability with ADL's, has low fall risk, home safety reviewed and adequate, no other significant changes in hearing or vision, and only occasionally active with exercise.  Did see urology with elev PSA , has been as high as 9 (Dr Annabell Howells), biopsy neg x 1 in the past, f/u psa improved, so biopsy repeat not done, has f/u appt in dec 2014, and wants repeat PSA done here.  Has some OA of the hands that bother him,but still able to play golf 3-4 times per wk (though getting expensive now).   Due for flu and prevnar today Past Medical History  Diagnosis Date  . Increased prostate specific antigen (PSA) velocity   . History of nephrolithiasis   . Prostatitis   . Hyperlipidemia   . CAD (coronary artery disease) 07/2008    2 BMS placed for MI, August, 2009  . Atrial fibrillation     post MI-amiodarone stopped 07/2008  . Anxiety   . Depression   . Benign prostatic hypertrophy   . Thrombocytopenia   . Hx of colonoscopy   . CORONARY ARTERY BYPASS GRAFT, HX OF 07/13/2009    August, 2009, Dr. Laneta Simmers  . HYPERLIPIDEMIA 02/09/2009    Qualifier: Diagnosis of  By: [name] MD, Len Blalock   . NEPHROLITHIASIS, HX OF 02/09/2009    Qualifier:  Diagnosis of  By: [name] MD, Len Blalock   . PSA elevation 09/06/2012  . Ejection fraction     .  Marland Kitchen Hx of CABG   . Carotid artery disease without cerebral infarction    Past Surgical History  Procedure Laterality Date  . Prostate biopsy  2007    neg  . Rotator cuff repair      bilateral; Dr. Thurston Hole  . Hemorrhoid surgery    . Hydrocele excision    . Coronary artery bypass graft  08/2008  . Cardiac cath      BMS to RCA  . Left leg broken      reports that he has never smoked. He has never used smokeless tobacco. He reports that he does not drink alcohol or use illicit drugs. family history includes Leukemia in his father; Lung cancer in his mother. There is no history of Colon cancer, Rectal cancer, or Stomach cancer. Allergies  Allergen Reactions  . Niacin     REACTION: leg ache and itching   Current Outpatient Prescriptions on File Prior to Visit  Medication Sig Dispense Refill  . aspirin 81 MG EC tablet Take 1 tablet (81 mg total) by mouth daily. Swallow whole.  30 tablet  12  . Multiple Vitamin (MULTIVITAMIN) tablet Take 1 tablet by mouth daily.      . simvastatin (ZOCOR) 40 MG tablet TAKE ONE-HALF TABLET BY MOUTH AT  BEDTIME  45 tablet  0   No current facility-administered medications on file prior to visit.   Review of Systems Constitutional: Negative for diaphoresis, activity change, appetite change or unexpected weight change.  HENT: Negative for hearing loss, ear pain, facial swelling, mouth sores and neck stiffness.   Eyes: Negative for pain, redness and visual disturbance.  Respiratory: Negative for shortness of breath and wheezing.   Cardiovascular: Negative for chest pain and palpitations.  Gastrointestinal: Negative for diarrhea, blood in stool, abdominal distention or other pain Genitourinary: Negative for hematuria, flank pain or change in urine volume.  Musculoskeletal: Negative for myalgias and joint swelling.  Skin: Negative for color change and wound.   Neurological: Negative for syncope and numbness. other than noted Hematological: Negative for adenopathy.  Psychiatric/Behavioral: Negative for hallucinations, self-injury, decreased concentration and agitation.      Objective:   Physical Exam BP 130/70  Pulse 60  Temp(Src) 98 F (36.7 C) (Oral)  Ht 5\' 4"  (1.626 m)  Wt 135 lb 6 oz (61.406 kg)  BMI 23.23 kg/m2  SpO2 97% VS noted,  Constitutional: Pt is oriented to person, place, and time. Appears well-developed and well-nourished.  Head: Normocephalic and atraumatic.  Right Ear: External ear normal.  Left Ear: External ear normal.  Nose: Nose normal.  Mouth/Throat: Oropharynx is clear and moist.  Eyes: Conjunctivae and EOM are normal. Pupils are equal, round, and reactive to light.  Neck: Normal range of motion. Neck supple. No JVD present. No tracheal deviation present.  Cardiovascular: Normal rate, regular rhythm, normal heart sounds and intact distal pulses.   Pulmonary/Chest: Effort normal and breath sounds normal.  Abdominal: Soft. Bowel sounds are normal. There is no tenderness. No HSM  Musculoskeletal: Normal range of motion. Exhibits no edema. has some OA changes of the hands Lymphadenopathy:  Has no cervical adenopathy.  Neurological: Pt is alert and oriented to person, place, and time. Pt has normal reflexes. No cranial nerve deficit.  Skin: Skin is warm and dry. No rash noted.  Psychiatric:  Has  normal mood and affect. Behavior is normal.     Assessment & Plan:

## 2013-09-12 NOTE — Assessment & Plan Note (Signed)
stable overall by history and exam, recent data reviewed with pt, and pt to continue medical treatment as before,  to f/u any worsening symptoms or concerns Lab Results  Component Value Date   LDLCALC 72 09/06/2012

## 2013-09-15 ENCOUNTER — Encounter: Payer: Self-pay | Admitting: Internal Medicine

## 2013-09-29 DIAGNOSIS — H524 Presbyopia: Secondary | ICD-10-CM | POA: Diagnosis not present

## 2013-09-29 DIAGNOSIS — H43819 Vitreous degeneration, unspecified eye: Secondary | ICD-10-CM | POA: Diagnosis not present

## 2013-09-29 DIAGNOSIS — H2510 Age-related nuclear cataract, unspecified eye: Secondary | ICD-10-CM | POA: Diagnosis not present

## 2013-09-29 DIAGNOSIS — H5200 Hypermetropia, unspecified eye: Secondary | ICD-10-CM | POA: Diagnosis not present

## 2013-11-13 DIAGNOSIS — R972 Elevated prostate specific antigen [PSA]: Secondary | ICD-10-CM | POA: Diagnosis not present

## 2013-11-17 DIAGNOSIS — R972 Elevated prostate specific antigen [PSA]: Secondary | ICD-10-CM | POA: Diagnosis not present

## 2013-11-17 DIAGNOSIS — N529 Male erectile dysfunction, unspecified: Secondary | ICD-10-CM | POA: Diagnosis not present

## 2013-11-17 DIAGNOSIS — N400 Enlarged prostate without lower urinary tract symptoms: Secondary | ICD-10-CM | POA: Diagnosis not present

## 2013-11-24 DIAGNOSIS — D239 Other benign neoplasm of skin, unspecified: Secondary | ICD-10-CM | POA: Diagnosis not present

## 2013-11-24 DIAGNOSIS — L259 Unspecified contact dermatitis, unspecified cause: Secondary | ICD-10-CM | POA: Diagnosis not present

## 2013-12-06 ENCOUNTER — Other Ambulatory Visit: Payer: Self-pay | Admitting: Internal Medicine

## 2014-03-05 ENCOUNTER — Other Ambulatory Visit: Payer: Self-pay | Admitting: Orthopedic Surgery

## 2014-03-05 DIAGNOSIS — M19049 Primary osteoarthritis, unspecified hand: Secondary | ICD-10-CM | POA: Diagnosis not present

## 2014-03-05 DIAGNOSIS — M25532 Pain in left wrist: Secondary | ICD-10-CM

## 2014-03-05 DIAGNOSIS — IMO0002 Reserved for concepts with insufficient information to code with codable children: Secondary | ICD-10-CM | POA: Diagnosis not present

## 2014-03-11 ENCOUNTER — Ambulatory Visit
Admission: RE | Admit: 2014-03-11 | Discharge: 2014-03-11 | Disposition: A | Discharge status: Home / Self Care | Payer: Medicare Other | Source: Ambulatory Visit | Attending: Orthopedic Surgery | Admitting: Orthopedic Surgery

## 2014-03-11 DIAGNOSIS — M25532 Pain in left wrist: Secondary | ICD-10-CM

## 2014-03-11 DIAGNOSIS — M25539 Pain in unspecified wrist: Secondary | ICD-10-CM | POA: Diagnosis not present

## 2014-03-19 DIAGNOSIS — M1A00X1 Idiopathic chronic gout, unspecified site, with tophus (tophi): Secondary | ICD-10-CM | POA: Diagnosis not present

## 2014-03-19 DIAGNOSIS — M19049 Primary osteoarthritis, unspecified hand: Secondary | ICD-10-CM | POA: Diagnosis not present

## 2014-03-19 DIAGNOSIS — M109 Gout, unspecified: Secondary | ICD-10-CM | POA: Diagnosis not present

## 2014-04-03 DIAGNOSIS — H43819 Vitreous degeneration, unspecified eye: Secondary | ICD-10-CM | POA: Diagnosis not present

## 2014-04-09 DIAGNOSIS — M109 Gout, unspecified: Secondary | ICD-10-CM | POA: Diagnosis not present

## 2014-04-09 DIAGNOSIS — M19049 Primary osteoarthritis, unspecified hand: Secondary | ICD-10-CM | POA: Diagnosis not present

## 2014-06-04 DIAGNOSIS — H43819 Vitreous degeneration, unspecified eye: Secondary | ICD-10-CM | POA: Diagnosis not present

## 2014-06-22 ENCOUNTER — Other Ambulatory Visit: Payer: Self-pay | Admitting: Internal Medicine

## 2014-06-23 ENCOUNTER — Other Ambulatory Visit: Payer: Self-pay | Admitting: Internal Medicine

## 2014-09-03 ENCOUNTER — Ambulatory Visit (INDEPENDENT_AMBULATORY_CARE_PROVIDER_SITE_OTHER): Payer: Medicare Other | Admitting: Cardiology

## 2014-09-03 ENCOUNTER — Encounter: Payer: Self-pay | Admitting: Cardiology

## 2014-09-03 VITALS — BP 110/62 | HR 55 | Ht 64.0 in | Wt 135.4 lb

## 2014-09-03 DIAGNOSIS — I2510 Atherosclerotic heart disease of native coronary artery without angina pectoris: Secondary | ICD-10-CM | POA: Diagnosis not present

## 2014-09-03 DIAGNOSIS — D696 Thrombocytopenia, unspecified: Secondary | ICD-10-CM | POA: Diagnosis not present

## 2014-09-03 NOTE — Assessment & Plan Note (Signed)
It was felt that there was some thrombocytopenia from higher dose statin. Therefore he was kept on his current dose.

## 2014-09-03 NOTE — Patient Instructions (Addendum)
**Note De-identified Janyah Singleterry Obfuscation** Your physician recommends that you continue on your current medications as directed. Please refer to the Current Medication list given to you today.   Your physician wants you to follow-up in:1 YEAR  You will receive a reminder letter in the mail two months in advance. If you don't receive a letter, please call our office to schedule the follow-up appointment.  

## 2014-09-03 NOTE — Assessment & Plan Note (Signed)
Coronary disease is stable. He is on appropriate medications. His current dose of simvastatin will be the best option for him at this time. His LDL is at goal. His LV function was normal by echo last year. He exercises very regularly. Therefore I have chosen not to proceed with an exercise test. I may consider this next year.

## 2014-09-03 NOTE — Progress Notes (Signed)
Patient ID: [name], male   DOB: May 26, 1941, 73 y.o.   MRN: [id number]    HPI  Patient is seen in follow coronary disease. He is very active. He plays golf multiple times during the week and exercises in the gym. I encouraged him to keep up his activity. I told him to be careful not to over do it in any setting. He is on appropriate medications. In the past he had decreased platelets with higher dose of simvastatin. He's been stable on the current dose. Therefore, we will not change his medications.  Allergies  Allergen Reactions  . Niacin     REACTION: leg ache and itching    Current Outpatient Prescriptions  Medication Sig Dispense Refill  . aspirin 81 MG EC tablet Take 1 tablet (81 mg total) by mouth daily. Swallow whole.  30 tablet  12  . Multiple Vitamin (MULTIVITAMIN) tablet Take 1 tablet by mouth daily.      . Naproxen Sodium (ALEVE PO) Take by mouth as needed. Wrist pain      . simvastatin (ZOCOR) 40 MG tablet TAKE ONE-HALF TABLET BY MOUTH AT BEDTIME  45 tablet  1   No current facility-administered medications for this visit.    History   Social History  . Marital Status: Married    Spouse Name: N/A    Number of Children: 1  . Years of Education: N/A   Occupational History  . Not on file.   Social History Main Topics  . Smoking status: Never Smoker   . Smokeless tobacco: Never Used  . Alcohol Use: No  . Drug Use: No  . Sexual Activity: Not on file   Other Topics Concern  . Not on file   Social History Narrative  . No narrative on file    Family History  Problem Relation Age of Onset  . Leukemia Father   . Lung cancer Mother   . Colon cancer Neg Hx   . Rectal cancer Neg Hx   . Stomach cancer Neg Hx     Past Medical History  Diagnosis Date  . Increased prostate specific antigen (PSA) velocity   . History of nephrolithiasis   . Prostatitis   . Hyperlipidemia   . CAD (coronary artery disease) 07/2008    2 BMS placed for MI, August, 2009  .  Atrial fibrillation     post MI-amiodarone stopped 07/2008  . Anxiety   . Depression   . Benign prostatic hypertrophy   . Thrombocytopenia   . Hx of colonoscopy   . CORONARY ARTERY BYPASS GRAFT, HX OF 07/13/2009    August, 2009, Dr. Cyndia Bent  . HYPERLIPIDEMIA 02/09/2009    Qualifier: Diagnosis of  By: Jenny Reichmann MD, Hunt Oris   . NEPHROLITHIASIS, HX OF 02/09/2009    Qualifier: Diagnosis of  By: Jenny Reichmann MD, Hunt Oris   . PSA elevation 09/06/2012  . Ejection fraction     .  Marland Kitchen Hx of CABG   . Carotid artery disease without cerebral infarction     Past Surgical History  Procedure Laterality Date  . Prostate biopsy  2007    neg  . Rotator cuff repair      bilateral; Dr. Noemi Chapel  . Hemorrhoid surgery    . Hydrocele excision    . Coronary artery bypass graft  08/2008  . Cardiac cath      BMS to RCA  . Left leg broken      Patient Active Problem List   Diagnosis  Date Noted  . Mitral regurgitation 09/05/2013  . Carotid artery disease without cerebral infarction   . Hx of CABG   . Ejection fraction   . Preventative health care 08/30/2012  . Atrial fibrillation   . THROMBOCYTOPENIA 07/26/2009  . Headache 07/15/2009  . HYPERLIPIDEMIA 02/09/2009  . ANXIETY 02/09/2009  . DEPRESSION 02/09/2009  . BENIGN PROSTATIC HYPERTROPHY 02/09/2009  . PSA, INCREASED 02/09/2009  . NEPHROLITHIASIS, HX OF 02/09/2009  . CAD (coronary artery disease) 07/11/2008    ROS    Patient denies fever, chills, headache, sweats, rash, change in vision, change in hearing, chest pain, cough, nausea or vomiting, urinary symptoms. All other systems are reviewed and are negative.  PHYSICAL EXAM  He looks great. He is oriented to person time and place. Affect is normal. Head is atraumatic. Sclera and conjunctiva are normal. There is no jugulovenous distention. Lungs are clear. Respiratory effort is nonlabored. Cardiac exam reveals S1 and S2. Abdomen is soft. There is no peripheral edema.  Filed Vitals:   09/03/14 0807  BP: 110/62    Pulse: 55  Height: 5\' 4"  (1.626 m)  Weight: 135 lb 6.4 oz (61.417 kg)    EKG   EKG is done today and reviewed by me. EKG is normal with mild sinus bradycardia.  ASSESSMENT & PLAN

## 2015-01-14 ENCOUNTER — Ambulatory Visit (INDEPENDENT_AMBULATORY_CARE_PROVIDER_SITE_OTHER): Payer: Medicare Other

## 2015-01-14 DIAGNOSIS — Z23 Encounter for immunization: Secondary | ICD-10-CM

## 2015-02-22 ENCOUNTER — Emergency Department (INDEPENDENT_AMBULATORY_CARE_PROVIDER_SITE_OTHER)
Admission: EM | Admit: 2015-02-22 | Discharge: 2015-02-22 | Disposition: A | Discharge status: Home / Self Care | Payer: Federal, State, Local not specified - PPO | Source: Home / Self Care | Attending: Family Medicine | Admitting: Family Medicine

## 2015-02-22 ENCOUNTER — Encounter (HOSPITAL_COMMUNITY): Payer: Self-pay | Admitting: Emergency Medicine

## 2015-02-22 DIAGNOSIS — N3001 Acute cystitis with hematuria: Secondary | ICD-10-CM

## 2015-02-22 LAB — POCT URINALYSIS DIP (DEVICE)
Bilirubin Urine: NEGATIVE
Glucose, UA: NEGATIVE mg/dL
KETONES UR: NEGATIVE mg/dL
LEUKOCYTES UA: NEGATIVE
NITRITE: NEGATIVE
PH: 5 (ref 5.0–8.0)
PROTEIN: NEGATIVE mg/dL
Specific Gravity, Urine: 1.02 (ref 1.005–1.030)
Urobilinogen, UA: 0.2 mg/dL (ref 0.0–1.0)

## 2015-02-22 MED ORDER — CEPHALEXIN 500 MG PO CAPS: 500.0000 mg | ORAL_CAPSULE | Freq: Three times a day (TID) | ORAL | Status: DC

## 2015-02-22 NOTE — ED Notes (Signed)
Pt states he is having RLQ pain that has been there since a dog jumped up on him.  He is concerned about a UTI or other injury since then because he has also had a fever as high as 102.8.

## 2015-02-22 NOTE — Discharge Instructions (Signed)
Thank you for coming in today.   Urinary Tract Infection Urinary tract infections (UTIs) can develop anywhere along your urinary tract. Your urinary tract is your body's drainage system for removing wastes and extra water. Your urinary tract includes two kidneys, two ureters, a bladder, and a urethra. Your kidneys are a pair of bean-shaped organs. Each kidney is about the size of your fist. They are located below your ribs, one on each side of your spine. CAUSES Infections are caused by microbes, which are microscopic organisms, including fungi, viruses, and bacteria. These organisms are so small that they can only be seen through a microscope. Bacteria are the microbes that most commonly cause UTIs. SYMPTOMS  Symptoms of UTIs may vary by age and gender of the patient and by the location of the infection. Symptoms in young women typically include a frequent and intense urge to urinate and a painful, burning feeling in the bladder or urethra during urination. Older women and men are more likely to be tired, shaky, and weak and have muscle aches and abdominal pain. A fever may mean the infection is in your kidneys. Other symptoms of a kidney infection include pain in your back or sides below the ribs, nausea, and vomiting. DIAGNOSIS To diagnose a UTI, your caregiver will ask you about your symptoms. Your caregiver also will ask to provide a urine sample. The urine sample will be tested for bacteria and white blood cells. White blood cells are made by your body to help fight infection. TREATMENT  Typically, UTIs can be treated with medication. Because most UTIs are caused by a bacterial infection, they usually can be treated with the use of antibiotics. The choice of antibiotic and length of treatment depend on your symptoms and the type of bacteria causing your infection. HOME CARE INSTRUCTIONS  If you were prescribed antibiotics, take them exactly as your caregiver instructs you. Finish the medication  even if you feel better after you have only taken some of the medication.  Drink enough water and fluids to keep your urine clear or pale yellow.  Avoid caffeine, tea, and carbonated beverages. They tend to irritate your bladder.  Empty your bladder often. Avoid holding urine for long periods of time.  Empty your bladder before and after sexual intercourse.  After a bowel movement, women should cleanse from front to back. Use each tissue only once. SEEK MEDICAL CARE IF:   You have back pain.  You develop a fever.  Your symptoms do not begin to resolve within 3 days. SEEK IMMEDIATE MEDICAL CARE IF:   You have severe back pain or lower abdominal pain.  You develop chills.  You have nausea or vomiting.  You have continued burning or discomfort with urination. MAKE SURE YOU:   Understand these instructions.  Will watch your condition.  Will get help right away if you are not doing well or get worse. Document Released: 09/06/2005 Document Revised: 05/28/2012 Document Reviewed: 01/05/2012 [facility] Patient Information 2015 [place], [place]. This information is not intended to replace advice given to you by your health care provider. Make sure you discuss any questions you have with your health care provider.

## 2015-02-22 NOTE — ED Provider Notes (Signed)
[name] is a 74 y.o. male who presents to Urgent Care today for urinary frequency urgency and dysuria present for 5 days. No vomiting or diarrhea. No pelvic pain. No treatments tried yet. No penile discharge. Patient had initial fevers but none since. No chest pains or palpitations.   Past Medical History  Diagnosis Date  . Increased prostate specific antigen (PSA) velocity   . History of nephrolithiasis   . Prostatitis   . Hyperlipidemia   . CAD (coronary artery disease) 07/2008    2 BMS placed for MI, August, 2009  . Atrial fibrillation     post MI-amiodarone stopped 07/2008  . Anxiety   . Depression   . Benign prostatic hypertrophy   . Thrombocytopenia   . Hx of colonoscopy   . CORONARY ARTERY BYPASS GRAFT, HX OF 07/13/2009    August, 2009, Dr. Cyndia Bent  . HYPERLIPIDEMIA 02/09/2009    Qualifier: Diagnosis of  By: Jenny Reichmann MD, Hunt Oris   . NEPHROLITHIASIS, HX OF 02/09/2009    Qualifier: Diagnosis of  By: Jenny Reichmann MD, Hunt Oris   . PSA elevation 09/06/2012  . Ejection fraction     .  Marland Kitchen Hx of CABG   . Carotid artery disease without cerebral infarction    Past Surgical History  Procedure Laterality Date  . Prostate biopsy  2007    neg  . Rotator cuff repair      bilateral; Dr. Noemi Chapel  . Hemorrhoid surgery    . Hydrocele excision    . Coronary artery bypass graft  08/2008  . Cardiac cath      BMS to RCA  . Left leg broken     History  Substance Use Topics  . Smoking status: Never Smoker   . Smokeless tobacco: Never Used  . Alcohol Use: No   ROS as above Medications: No current facility-administered medications for this encounter.   Current Outpatient Prescriptions  Medication Sig Dispense Refill  . aspirin 81 MG EC tablet Take 1 tablet (81 mg total) by mouth daily. Swallow whole. 30 tablet 12  . cephALEXin (KEFLEX) 500 MG capsule Take 1 capsule (500 mg total) by mouth 3 (three) times daily. 21 capsule 0  . Multiple Vitamin (MULTIVITAMIN) tablet Take 1 tablet by mouth daily.     . Naproxen Sodium (ALEVE PO) Take by mouth as needed. Wrist pain    . simvastatin (ZOCOR) 40 MG tablet TAKE ONE-HALF TABLET BY MOUTH AT BEDTIME 45 tablet 1   Allergies  Allergen Reactions  . Niacin     REACTION: leg ache and itching     Exam:  BP 126/75 mmHg  Pulse 67  Temp(Src) 98.1 F (36.7 C) (Oral)  Resp 12  SpO2 98% Gen: Well NAD HEENT: EOMI,  MMM Lungs: Normal work of breathing. CTABL Heart: RRR no MRG Abd: NABS, Soft. Nondistended, Nontender Exts: Brisk capillary refill, warm and well perfused.  Rectal: Normal rectal tone. Prostate is large but not boggy or tender with no nodules  Results for orders placed or performed during the hospital encounter of 02/22/15 (from the past 24 hour(s))  POCT urinalysis dip (device)     Status: Abnormal   Collection Time: 02/22/15 10:58 AM  Result Value Ref Range   Glucose, UA NEGATIVE NEGATIVE mg/dL   Bilirubin Urine NEGATIVE NEGATIVE   Ketones, ur NEGATIVE NEGATIVE mg/dL   Specific Gravity, Urine 1.020 1.005 - 1.030   Hgb urine dipstick TRACE (A) NEGATIVE   pH 5.0 5.0 - 8.0  Protein, ur NEGATIVE NEGATIVE mg/dL   Urobilinogen, UA 0.2 0.0 - 1.0 mg/dL   Nitrite NEGATIVE NEGATIVE   Leukocytes, UA NEGATIVE NEGATIVE   No results found.  Assessment and Plan: 74 y.o. male with probable UTI. Culture pending. Peak treat with Keflex for one week. Follow-up with PCP.  Discussed warning signs or symptoms. Please see discharge instructions. Patient expresses understanding.     Gregor Hams, MD 02/22/15 [phone]

## 2015-02-23 LAB — URINE CULTURE
Colony Count: NO GROWTH
Culture: NO GROWTH
Special Requests: NORMAL

## 2015-03-25 ENCOUNTER — Other Ambulatory Visit (INDEPENDENT_AMBULATORY_CARE_PROVIDER_SITE_OTHER): Payer: Federal, State, Local not specified - PPO

## 2015-03-25 ENCOUNTER — Ambulatory Visit (INDEPENDENT_AMBULATORY_CARE_PROVIDER_SITE_OTHER): Payer: Federal, State, Local not specified - PPO | Admitting: Internal Medicine

## 2015-03-25 ENCOUNTER — Encounter: Payer: Self-pay | Admitting: Internal Medicine

## 2015-03-25 VITALS — BP 118/70 | HR 70 | Temp 98.2°F | Resp 18 | Ht 64.0 in | Wt 136.1 lb

## 2015-03-25 DIAGNOSIS — F329 Major depressive disorder, single episode, unspecified: Secondary | ICD-10-CM

## 2015-03-25 DIAGNOSIS — E785 Hyperlipidemia, unspecified: Secondary | ICD-10-CM

## 2015-03-25 DIAGNOSIS — D696 Thrombocytopenia, unspecified: Secondary | ICD-10-CM

## 2015-03-25 DIAGNOSIS — R972 Elevated prostate specific antigen [PSA]: Secondary | ICD-10-CM

## 2015-03-25 DIAGNOSIS — F32A Depression, unspecified: Secondary | ICD-10-CM

## 2015-03-25 LAB — CBC WITH DIFFERENTIAL/PLATELET
Basophils Absolute: 0 10*3/uL (ref 0.0–0.1)
Basophils Relative: 0.4 % (ref 0.0–3.0)
EOS PCT: 3.2 % (ref 0.0–5.0)
Eosinophils Absolute: 0.2 10*3/uL (ref 0.0–0.7)
HCT: 42.8 % (ref 39.0–52.0)
Hemoglobin: 14.6 g/dL (ref 13.0–17.0)
LYMPHS ABS: 2.2 10*3/uL (ref 0.7–4.0)
Lymphocytes Relative: 31.5 % (ref 12.0–46.0)
MCHC: 34.1 g/dL (ref 30.0–36.0)
MCV: 88.4 fl (ref 78.0–100.0)
Monocytes Absolute: 0.6 10*3/uL (ref 0.1–1.0)
Monocytes Relative: 8.5 % (ref 3.0–12.0)
Neutro Abs: 3.9 10*3/uL (ref 1.4–7.7)
Neutrophils Relative %: 56.4 % (ref 43.0–77.0)
PLATELETS: 121 10*3/uL — AB (ref 150.0–400.0)
RBC: 4.84 Mil/uL (ref 4.22–5.81)
RDW: 13.3 % (ref 11.5–15.5)
WBC: 7 10*3/uL (ref 4.0–10.5)

## 2015-03-25 LAB — BASIC METABOLIC PANEL
BUN: 12 mg/dL (ref 6–23)
CO2: 32 meq/L (ref 19–32)
Calcium: 9.3 mg/dL (ref 8.4–10.5)
Chloride: 103 mEq/L (ref 96–112)
Creatinine, Ser: 0.99 mg/dL (ref 0.40–1.50)
GFR: 78.74 mL/min (ref >60.00)
Glucose, Bld: 96 mg/dL (ref 70–99)
Potassium: 4.7 mEq/L (ref 3.5–5.1)
SODIUM: 138 meq/L (ref 135–145)

## 2015-03-25 LAB — HEPATIC FUNCTION PANEL
ALBUMIN: 4.1 g/dL (ref 3.5–5.2)
ALK PHOS: 44 U/L (ref 39–117)
ALT: 13 U/L (ref 0–53)
AST: 26 U/L (ref 0–37)
Bilirubin, Direct: 0.2 mg/dL (ref 0.0–0.3)
Total Bilirubin: 0.8 mg/dL (ref 0.2–1.2)
Total Protein: 6.9 g/dL (ref 6.0–8.3)

## 2015-03-25 LAB — URINALYSIS, ROUTINE W REFLEX MICROSCOPIC
BILIRUBIN URINE: NEGATIVE
Hgb urine dipstick: NEGATIVE
KETONES UR: NEGATIVE
Leukocytes, UA: NEGATIVE
Nitrite: NEGATIVE
SPECIFIC GRAVITY, URINE: 1.025 (ref 1.000–1.030)
Total Protein, Urine: NEGATIVE
UROBILINOGEN UA: 0.2 (ref 0.0–1.0)
Urine Glucose: NEGATIVE
pH: 5.5 (ref 5.0–8.0)

## 2015-03-25 LAB — LIPID PANEL
Cholesterol: 129 mg/dL (ref 0–200)
HDL: 48.6 mg/dL (ref >39.00)
LDL Cholesterol: 55 mg/dL (ref 0–99)
NONHDL: 80.4
TRIGLYCERIDES: 127 mg/dL (ref 0.0–149.0)
Total CHOL/HDL Ratio: 3
VLDL: 25.4 mg/dL (ref 0.0–40.0)

## 2015-03-25 LAB — PSA: PSA: 9.06 ng/mL — ABNORMAL HIGH (ref 0.10–4.00)

## 2015-03-25 LAB — TSH: TSH: 2.09 u[IU]/mL (ref 0.35–4.50)

## 2015-03-25 NOTE — Assessment & Plan Note (Signed)
stable overall by history and exam, recent data reviewed with pt, and pt to continue medical treatment as before,  to f/u any worsening symptoms or concerns Lab Results  Component Value Date   WBC 6.0 09/12/2013   HGB 14.8 09/12/2013   HCT 43.4 09/12/2013   PLT 115.0* 09/12/2013   GLUCOSE 100* 09/12/2013   CHOL 137 09/12/2013   TRIG 73.0 09/12/2013   HDL 46.10 09/12/2013   LDLCALC 76 09/12/2013   ALT 23 09/12/2013   AST 33 09/12/2013   NA 139 09/12/2013   K 5.0 09/12/2013   CL 104 09/12/2013   CREATININE 1.1 09/12/2013   BUN 14 09/12/2013   CO2 31 09/12/2013   TSH 1.44 09/12/2013   PSA 5.26* 09/12/2013   HGBA1C 5.7 02/04/2009

## 2015-03-25 NOTE — Assessment & Plan Note (Signed)
Chronic mild persistent, for /fu lab

## 2015-03-25 NOTE — Progress Notes (Signed)
Subjective:    Patient ID: [name], male    DOB: 11/24/1941, 74 y.o.   MRN: [id number]  HPI  Here for yearly f/u;  Overall doing ok;  Pt denies Chest pain, worsening SOB, DOE, wheezing, orthopnea, PND, worsening LE edema, palpitations, dizziness or syncope.  Pt denies neurological change such as new headache, facial or extremity weakness.  Pt denies polydipsia, polyuria, or low sugar symptoms. Pt states overall good compliance with treatment and medications, good tolerability, and has been trying to follow appropriate diet.  Pt denies worsening depressive symptoms, suicidal ideation or panic. No fever, night sweats, wt loss, loss of appetite, or other constitutional symptoms.  Pt states good ability with ADL's, has low fall risk, home safety reviewed and adequate, no other significant changes in hearing or vision, and only occasionally active with exercise.   No current complaints Denies urinary symptoms such as dysuria, frequency, urgency, flank pain, hematuria or n/v, fever, chills.  Plays golf, walks more than cart, 3 times per wk.   Past Medical History  Diagnosis Date  . Increased prostate specific antigen (PSA) velocity   . History of nephrolithiasis   . Prostatitis   . Hyperlipidemia   . CAD (coronary artery disease) 07/2008    2 BMS placed for MI, August, 2009  . Atrial fibrillation     post MI-amiodarone stopped 07/2008  . Anxiety   . Depression   . Benign prostatic hypertrophy   . Thrombocytopenia   . Hx of colonoscopy   . CORONARY ARTERY BYPASS GRAFT, HX OF 07/13/2009    August, 2009, Dr. Cyndia Bent  . HYPERLIPIDEMIA 02/09/2009    Qualifier: Diagnosis of  By: Jenny Reichmann MD, Hunt Oris   . NEPHROLITHIASIS, HX OF 02/09/2009    Qualifier: Diagnosis of  By: Jenny Reichmann MD, Hunt Oris   . PSA elevation 09/06/2012  . Ejection fraction     .  Marland Kitchen Hx of CABG   . Carotid artery disease without cerebral infarction    Past Surgical History  Procedure Laterality Date  . Prostate biopsy  2007    neg  .  Rotator cuff repair      bilateral; Dr. Noemi Chapel  . Hemorrhoid surgery    . Hydrocele excision    . Coronary artery bypass graft  08/2008  . Cardiac cath      BMS to RCA  . Left leg broken      reports that he has never smoked. He has never used smokeless tobacco. He reports that he does not drink alcohol or use illicit drugs. family history includes Leukemia in his father; Lung cancer in his mother. There is no history of Colon cancer, Rectal cancer, or Stomach cancer. Allergies  Allergen Reactions  . Niacin     REACTION: leg ache and itching   Current Outpatient Prescriptions on File Prior to Visit  Medication Sig Dispense Refill  . aspirin 81 MG EC tablet Take 1 tablet (81 mg total) by mouth daily. Swallow whole. 30 tablet 12  . Multiple Vitamin (MULTIVITAMIN) tablet Take 1 tablet by mouth daily.    . Naproxen Sodium (ALEVE PO) Take by mouth as needed. Wrist pain    . simvastatin (ZOCOR) 40 MG tablet TAKE ONE-HALF TABLET BY MOUTH AT BEDTIME 45 tablet 1  . cephALEXin (KEFLEX) 500 MG capsule Take 1 capsule (500 mg total) by mouth 3 (three) times daily. (Patient not taking: Reported on 03/25/2015) 21 capsule 0   No current facility-administered medications on file prior to  visit.   Review of Systems Constitutional: Negative for increased diaphoresis, other activity, appetite or siginficant weight change other than noted HENT: Negative for worsening hearing loss, ear pain, facial swelling, mouth sores and neck stiffness.   Eyes: Negative for other worsening pain, redness or visual disturbance.  Respiratory: Negative for shortness of breath and wheezing  Cardiovascular: Negative for chest pain and palpitations.  Gastrointestinal: Negative for diarrhea, blood in stool, abdominal distention or other pain Genitourinary: Negative for hematuria, flank pain or change in urine volume.  Musculoskeletal: Negative for myalgias or other joint complaints.  Skin: Negative for color change and wound  or drainage.  Neurological: Negative for syncope and numbness. other than noted Hematological: Negative for adenopathy. or other swelling Psychiatric/Behavioral: Negative for hallucinations, SI, self-injury, decreased concentration or other worsening agitation.      Objective:   Physical Exam BP 118/70 mmHg  Pulse 70  Temp(Src) 98.2 F (36.8 C) (Oral)  Resp 18  Ht 5\' 4"  (1.626 m)  Wt 136 lb 1.3 oz (61.725 kg)  BMI 23.35 kg/m2  SpO2 97% VS noted,  Constitutional: Pt is oriented to person, place, and time. Appears well-developed and well-nourished, in no significant distress Head: Normocephalic and atraumatic.  Right Ear: External ear normal.  Left Ear: External ear normal.  Nose: Nose normal.  Mouth/Throat: Oropharynx is clear and moist.  Eyes: Conjunctivae and EOM are normal. Pupils are equal, round, and reactive to light.  Neck: Normal range of motion. Neck supple. No JVD present. No tracheal deviation present or significant neck LA or mass Cardiovascular: Normal rate, regular rhythm, normal heart sounds and intact distal pulses.   Pulmonary/Chest: Effort normal and breath sounds without rales or wheezing  Abdominal: Soft. Bowel sounds are normal. NT. No HSM  Musculoskeletal: Normal range of motion. Exhibits no edema.  Lymphadenopathy:  Has no cervical adenopathy.  Neurological: Pt is alert and oriented to person, place, and time. Pt has normal reflexes. No cranial nerve deficit. Motor grossly intact Skin: Skin is warm and dry. No rash noted.  Psychiatric:  Has normal mood and affect. Behavior is normal. not depressed affect     Assessment & Plan:

## 2015-03-25 NOTE — Progress Notes (Signed)
Pre visit review using our clinic review tool, if applicable. No additional management support is needed unless otherwise documented below in the visit note. 

## 2015-03-25 NOTE — Addendum Note (Signed)
Addended by: Biagio Borg on: 03/25/2015 10:49 AM   Modules accepted: Orders, SmartSet

## 2015-03-25 NOTE — Assessment & Plan Note (Signed)
stable overall by history and exam, recent data reviewed with pt, and pt to continue medical treatment as before,  to f/u any worsening symptoms or concerns Lab Results  Component Value Date   LDLCALC 76 09/12/2013

## 2015-03-25 NOTE — Patient Instructions (Signed)

## 2015-03-25 NOTE — Assessment & Plan Note (Signed)
Asympt, stable overall by history and exam, recent data reviewed with pt, and pt to continue medical treatment as before,  to f/u any worsening symptoms or concerns, for f/u lab Lab Results  Component Value Date   PSA 5.26* 09/12/2013   PSA 5.96* 09/06/2012   PSA 3.17 02/04/2009

## 2015-03-26 ENCOUNTER — Encounter: Payer: Self-pay | Admitting: Internal Medicine

## 2015-03-26 ENCOUNTER — Other Ambulatory Visit: Payer: Self-pay | Admitting: Internal Medicine

## 2015-03-26 DIAGNOSIS — R972 Elevated prostate specific antigen [PSA]: Secondary | ICD-10-CM

## 2015-03-31 ENCOUNTER — Encounter: Payer: Self-pay | Admitting: Internal Medicine

## 2015-05-21 DIAGNOSIS — H1032 Unspecified acute conjunctivitis, left eye: Secondary | ICD-10-CM | POA: Diagnosis not present

## 2015-05-27 DIAGNOSIS — H02105 Unspecified ectropion of left lower eyelid: Secondary | ICD-10-CM | POA: Diagnosis not present

## 2015-05-27 DIAGNOSIS — H04123 Dry eye syndrome of bilateral lacrimal glands: Secondary | ICD-10-CM | POA: Diagnosis not present

## 2015-05-27 DIAGNOSIS — H2513 Age-related nuclear cataract, bilateral: Secondary | ICD-10-CM | POA: Diagnosis not present

## 2015-05-27 DIAGNOSIS — H02102 Unspecified ectropion of right lower eyelid: Secondary | ICD-10-CM | POA: Diagnosis not present

## 2015-05-31 DIAGNOSIS — R972 Elevated prostate specific antigen [PSA]: Secondary | ICD-10-CM | POA: Diagnosis not present

## 2015-05-31 DIAGNOSIS — N400 Enlarged prostate without lower urinary tract symptoms: Secondary | ICD-10-CM | POA: Diagnosis not present

## 2015-05-31 DIAGNOSIS — N5201 Erectile dysfunction due to arterial insufficiency: Secondary | ICD-10-CM | POA: Diagnosis not present

## 2015-07-07 ENCOUNTER — Other Ambulatory Visit: Payer: Self-pay | Admitting: Internal Medicine

## 2015-08-09 ENCOUNTER — Other Ambulatory Visit: Payer: Self-pay | Admitting: Dermatology

## 2015-08-09 DIAGNOSIS — L570 Actinic keratosis: Secondary | ICD-10-CM | POA: Diagnosis not present

## 2015-08-09 DIAGNOSIS — D485 Neoplasm of uncertain behavior of skin: Secondary | ICD-10-CM | POA: Diagnosis not present

## 2015-08-09 DIAGNOSIS — L820 Inflamed seborrheic keratosis: Secondary | ICD-10-CM | POA: Diagnosis not present

## 2015-10-04 ENCOUNTER — Other Ambulatory Visit: Payer: Self-pay | Admitting: Cardiovascular Disease

## 2015-10-04 DIAGNOSIS — I6523 Occlusion and stenosis of bilateral carotid arteries: Secondary | ICD-10-CM

## 2015-10-05 ENCOUNTER — Ambulatory Visit (HOSPITAL_COMMUNITY)
Admission: RE | Admit: 2015-10-05 | Discharge: 2015-10-05 | Disposition: A | Discharge status: Home / Self Care | Payer: Medicare Other | Source: Ambulatory Visit | Attending: Cardiology | Admitting: Cardiology

## 2015-10-05 DIAGNOSIS — I6523 Occlusion and stenosis of bilateral carotid arteries: Secondary | ICD-10-CM | POA: Insufficient documentation

## 2015-10-05 DIAGNOSIS — E785 Hyperlipidemia, unspecified: Secondary | ICD-10-CM | POA: Insufficient documentation

## 2015-10-13 ENCOUNTER — Encounter: Payer: Self-pay | Admitting: *Deleted

## 2015-10-18 ENCOUNTER — Ambulatory Visit: Payer: Federal, State, Local not specified - PPO | Admitting: Cardiovascular Disease

## 2015-10-21 ENCOUNTER — Encounter: Payer: Self-pay | Admitting: Cardiovascular Disease

## 2015-10-21 ENCOUNTER — Ambulatory Visit (INDEPENDENT_AMBULATORY_CARE_PROVIDER_SITE_OTHER): Payer: Medicare Other | Admitting: Cardiovascular Disease

## 2015-10-21 VITALS — BP 120/70 | HR 62 | Ht 64.0 in | Wt 138.0 lb

## 2015-10-21 DIAGNOSIS — I6523 Occlusion and stenosis of bilateral carotid arteries: Secondary | ICD-10-CM | POA: Diagnosis not present

## 2015-10-21 DIAGNOSIS — I2510 Atherosclerotic heart disease of native coronary artery without angina pectoris: Secondary | ICD-10-CM

## 2015-10-21 DIAGNOSIS — E785 Hyperlipidemia, unspecified: Secondary | ICD-10-CM | POA: Diagnosis not present

## 2015-10-21 DIAGNOSIS — I4891 Unspecified atrial fibrillation: Secondary | ICD-10-CM

## 2015-10-21 NOTE — Patient Instructions (Signed)
Medication Instructions:  Your physician recommends that you continue on your current medications as directed. Please refer to the Current Medication list given to you today.   Labwork: Your physician recommends that you return for lab work in: 1 year on the day of or a few days before your office visit with Dr. Nahser.  You will need to FAST for this appointment - nothing to eat or drink after midnight the night before except water.   Testing/Procedures: None Ordered   Follow-Up: Your physician wants you to follow-up in: 1 year with Dr. Nahser.  You will receive a reminder letter in the mail two months in advance. If you don't receive a letter, please call our office to schedule the follow-up appointment.   If you need a refill on your cardiac medications before your next appointment, please call your pharmacy.   Thank you for choosing [facility]! Chivas Notz, RN [phone]    

## 2015-10-21 NOTE — Progress Notes (Signed)
Cardiology Office Note   Date:  10/21/2015   ID:  [name], [name] June 25, 1941, MRN [id number]  PCP:  Cathlean Cower, MD  Cardiologist:   Acie Fredrickson Wonda Cheng, MD   Transfer fro Dr. Ron Parker   Chief Complaint  Patient presents with  . Follow-up    CAD   Problem list 1. Coronary artery disease - MI - had emergent BMS 2009 followed by emergent CABG 2. Hyperlipidemia  3. Essential hypertension 4. History of atrial fibrillation   History of Present Illness: JOLEN BARDI is a 74 y.o. male who presents for followed up of CAD No CP, Plays golf regularly , works out regularly Retired from [employer] .     Past Medical History  Diagnosis Date  . Increased prostate specific antigen (PSA) velocity   . History of nephrolithiasis   . Prostatitis   . Hyperlipidemia   . CAD (coronary artery disease) 07/2008    2 BMS placed for MI, August, 2009  . Atrial fibrillation ([place])     post MI-amiodarone stopped 07/2008  . Anxiety   . Depression   . Benign prostatic hypertrophy   . Thrombocytopenia ([place])   . Hx of colonoscopy   . CORONARY ARTERY BYPASS GRAFT, HX OF 07/13/2009    August, 2009, Dr. Cyndia Bent  . HYPERLIPIDEMIA 02/09/2009    Qualifier: Diagnosis of  By: Jenny Reichmann MD, Hunt Oris   . NEPHROLITHIASIS, HX OF 02/09/2009    Qualifier: Diagnosis of  By: Jenny Reichmann MD, Hunt Oris   . PSA elevation 09/06/2012  . Ejection fraction     .  Marland Kitchen Hx of CABG   . Carotid artery disease without cerebral infarction [facility])     Past Surgical History  Procedure Laterality Date  . Prostate biopsy  2007    neg  . Rotator cuff repair      bilateral; Dr. Noemi Chapel  . Hemorrhoid surgery    . Hydrocele excision    . Coronary artery bypass graft  08/05/08    x5, LIMA to the LAD.  Marland Kitchen Coronary stent placement  07/2008    2 BMS to RCA  . Left leg broken       Current Outpatient Prescriptions  Medication Sig Dispense Refill  . aspirin 81 MG EC tablet Take 1 tablet (81 mg total) by mouth daily. Swallow whole. 30  tablet 12  . Multiple Vitamin (MULTIVITAMIN) tablet Take 1 tablet by mouth daily.    . Naproxen Sodium (ALEVE PO) Take by mouth as needed. Wrist pain    . simvastatin (ZOCOR) 40 MG tablet TAKE ONE-HALF TABLET BY MOUTH AT BEDTIME 45 tablet 1  . simvastatin (ZOCOR) 40 MG tablet TAKE ONE-HALF TABLET BY MOUTH AT BEDTIME 45 tablet 1   No current facility-administered medications for this visit.    Allergies:   Niacin    Social History:  The patient  reports that he has never smoked. He has never used smokeless tobacco. He reports that he does not drink alcohol or use illicit drugs.   Family History:  The patient's family history includes Healthy in his brother; Leukemia in his father; Lung cancer in his mother. There is no history of Colon cancer, Rectal cancer, or Stomach cancer.    ROS:  Please see the history of present illness.    Review of Systems: Constitutional:  denies fever, chills, diaphoresis, appetite change and fatigue.  HEENT: denies photophobia, eye pain, redness, hearing loss, ear pain, congestion, sore throat, rhinorrhea, sneezing, neck pain,  neck stiffness and tinnitus.  Respiratory: denies SOB, DOE, cough, chest tightness, and wheezing.  Cardiovascular: denies chest pain, palpitations and leg swelling.  Gastrointestinal: denies nausea, vomiting, abdominal pain, diarrhea, constipation, blood in stool.  Genitourinary: denies dysuria, urgency, frequency, hematuria, flank pain and difficulty urinating.  Musculoskeletal: denies  myalgias, back pain, joint swelling, arthralgias and gait problem.   Skin: denies pallor, rash and wound.  Neurological: denies dizziness, seizures, syncope, weakness, light-headedness, numbness and headaches.   Hematological: denies adenopathy, easy bruising, personal or family bleeding history.  Psychiatric/ Behavioral: denies suicidal ideation, mood changes, confusion, nervousness, sleep disturbance and agitation.       All other systems are  reviewed and negative.    PHYSICAL EXAM: VS:  BP 120/70 mmHg  Pulse 62  Ht 5\' 4"  (1.626 m)  Wt 138 lb (62.596 kg)  BMI 23.68 kg/m2 , BMI Body mass index is 23.68 kg/(m^2). GEN: Well nourished, well developed, in no acute distress HEENT: normal Neck: no JVD, carotid bruits, or masses Cardiac: RRR; no murmurs, rubs, or gallops,no edema  Respiratory:  clear to auscultation bilaterally, normal work of breathing GI: soft, nontender, nondistended, + BS MS: no deformity or atrophy Skin: warm and dry, no rash Neuro:  Strength and sensation are intact Psych: normal   EKG:  EKG is ordered today. The ekg ordered today demonstrates NSR at 62.  Normal ECG    Recent Labs: 03/25/2015: ALT 13; BUN 12; Creatinine, Ser 0.99; Hemoglobin 14.6; Platelets 121.0*; Potassium 4.7; Sodium 138; TSH 2.09    Lipid Panel    Component Value Date/Time   CHOL 129 03/25/2015 1054   TRIG 127.0 03/25/2015 1054   HDL 48.60 03/25/2015 1054   CHOLHDL 3 03/25/2015 1054   VLDL 25.4 03/25/2015 1054   LDLCALC 55 03/25/2015 1054      Wt Readings from Last 3 Encounters:  10/21/15 138 lb (62.596 kg)  03/25/15 136 lb 1.3 oz (61.725 kg)  09/03/14 135 lb 6.4 oz (61.417 kg)      Other studies Reviewed: Additional studies/ records that were reviewed today include: . Review of the above records demonstrates:    ASSESSMENT AND PLAN:  1. Coronary artery disease - MI - had emergent BMS 2009 followed by emergent CABG Doing well.  No angina   2. Hyperlipidemia  - continue simvastatin   3. Essential hypertension 4. History of atrial fibrillation   Current medicines are reviewed at length with the patient today.  The patient does not have concerns regarding medicines.  The following changes have been made:  no change  Labs/ tests ordered today include:  No orders of the defined types were placed in this encounter.     Disposition:   FU with me in 1 year      Garold Sheeler, Wonda Cheng, MD  10/21/2015 9:45  AM    [address] [place], [place], [place]  [address] Phone: [phone]; Fax: [phone]   [facility]  [address] [place], [place]  [address] [phone]   Fax [phone]

## 2015-11-16 DIAGNOSIS — L570 Actinic keratosis: Secondary | ICD-10-CM | POA: Diagnosis not present

## 2015-12-21 DIAGNOSIS — Z029 Encounter for administrative examinations, unspecified: Secondary | ICD-10-CM | POA: Diagnosis not present

## 2016-01-08 ENCOUNTER — Other Ambulatory Visit: Payer: Self-pay | Admitting: Internal Medicine

## 2016-03-28 ENCOUNTER — Encounter: Payer: Self-pay | Admitting: Internal Medicine

## 2016-03-28 ENCOUNTER — Other Ambulatory Visit (INDEPENDENT_AMBULATORY_CARE_PROVIDER_SITE_OTHER): Payer: Medicare Other

## 2016-03-28 ENCOUNTER — Ambulatory Visit (INDEPENDENT_AMBULATORY_CARE_PROVIDER_SITE_OTHER): Payer: Medicare Other | Admitting: Internal Medicine

## 2016-03-28 VITALS — BP 118/70 | HR 56 | Resp 20 | Wt 137.0 lb

## 2016-03-28 DIAGNOSIS — R972 Elevated prostate specific antigen [PSA]: Secondary | ICD-10-CM

## 2016-03-28 DIAGNOSIS — I2510 Atherosclerotic heart disease of native coronary artery without angina pectoris: Secondary | ICD-10-CM

## 2016-03-28 DIAGNOSIS — F329 Major depressive disorder, single episode, unspecified: Secondary | ICD-10-CM

## 2016-03-28 DIAGNOSIS — M79672 Pain in left foot: Secondary | ICD-10-CM

## 2016-03-28 DIAGNOSIS — E785 Hyperlipidemia, unspecified: Secondary | ICD-10-CM

## 2016-03-28 DIAGNOSIS — F32A Depression, unspecified: Secondary | ICD-10-CM

## 2016-03-28 LAB — CBC WITH DIFFERENTIAL/PLATELET
BASOS PCT: 0.3 % (ref 0.0–3.0)
Basophils Absolute: 0 10*3/uL (ref 0.0–0.1)
EOS ABS: 0.2 10*3/uL (ref 0.0–0.7)
EOS PCT: 2.7 % (ref 0.0–5.0)
HEMATOCRIT: 42.6 % (ref 39.0–52.0)
HEMOGLOBIN: 14.5 g/dL (ref 13.0–17.0)
LYMPHS PCT: 38.8 % (ref 12.0–46.0)
Lymphs Abs: 2.3 10*3/uL (ref 0.7–4.0)
MCHC: 33.9 g/dL (ref 30.0–36.0)
MCV: 89.5 fl (ref 78.0–100.0)
MONOS PCT: 11 % (ref 3.0–12.0)
Monocytes Absolute: 0.6 10*3/uL (ref 0.1–1.0)
Neutro Abs: 2.8 10*3/uL (ref 1.4–7.7)
Neutrophils Relative %: 47.2 % (ref 43.0–77.0)
Platelets: 125 10*3/uL — ABNORMAL LOW (ref 150.0–400.0)
RBC: 4.76 Mil/uL (ref 4.22–5.81)
RDW: 12.8 % (ref 11.5–15.5)
WBC: 5.9 10*3/uL (ref 4.0–10.5)

## 2016-03-28 LAB — HEPATIC FUNCTION PANEL
ALT: 13 U/L (ref 0–53)
AST: 23 U/L (ref 0–37)
Albumin: 4.2 g/dL (ref 3.5–5.2)
Alkaline Phosphatase: 38 U/L — ABNORMAL LOW (ref 39–117)
BILIRUBIN TOTAL: 0.9 mg/dL (ref 0.2–1.2)
Bilirubin, Direct: 0.2 mg/dL (ref 0.0–0.3)
Total Protein: 6.7 g/dL (ref 6.0–8.3)

## 2016-03-28 LAB — LIPID PANEL
CHOLESTEROL: 128 mg/dL (ref 0–200)
HDL: 42.4 mg/dL (ref >39.00)
LDL Cholesterol: 63 mg/dL (ref 0–99)
NonHDL: 85.23
Total CHOL/HDL Ratio: 3
Triglycerides: 111 mg/dL (ref 0.0–149.0)
VLDL: 22.2 mg/dL (ref 0.0–40.0)

## 2016-03-28 LAB — URINALYSIS, ROUTINE W REFLEX MICROSCOPIC
Bilirubin Urine: NEGATIVE
HGB URINE DIPSTICK: NEGATIVE
Ketones, ur: NEGATIVE
Leukocytes, UA: NEGATIVE
NITRITE: NEGATIVE
Specific Gravity, Urine: 1.02 (ref 1.000–1.030)
Total Protein, Urine: NEGATIVE
URINE GLUCOSE: NEGATIVE
Urobilinogen, UA: 0.2 (ref 0.0–1.0)
pH: 6.5 (ref 5.0–8.0)

## 2016-03-28 LAB — BASIC METABOLIC PANEL
BUN: 15 mg/dL (ref 6–23)
CALCIUM: 9.3 mg/dL (ref 8.4–10.5)
CHLORIDE: 105 meq/L (ref 96–112)
CO2: 31 mEq/L (ref 19–32)
CREATININE: 1.08 mg/dL (ref 0.40–1.50)
GFR: 71.02 mL/min (ref >60.00)
GLUCOSE: 111 mg/dL — AB (ref 70–99)
Potassium: 4.9 mEq/L (ref 3.5–5.1)
Sodium: 141 mEq/L (ref 135–145)

## 2016-03-28 LAB — PSA: PSA: 5.96 ng/mL — AB (ref 0.10–4.00)

## 2016-03-28 LAB — TSH: TSH: 2.09 u[IU]/mL (ref 0.35–4.50)

## 2016-03-28 NOTE — Patient Instructions (Signed)
Please continue all other medications as before, and refills have been done if requested.  Please have the pharmacy call with any other refills you may need.  Please continue your efforts at being more active, low cholesterol diet, and weight control.  You are otherwise up to date with prevention measures today.  Please keep your appointments with your specialists as you may have planned  Please make appt with Dr Tamala Julian - Sports Medicine in this office for the left foot and ankle  Please go to the LAB in the Basement (turn left off the elevator) for the tests to be done today  You will be contacted by phone if any changes need to be made immediately.  Otherwise, you will receive a letter about your results with an explanation, but please check with MyChart first.  Please remember to sign up for MyChart if you have not done so, as this will be important to you in the future with finding out test results, communicating by private email, and scheduling acute appointments online when needed.  Please return in 1 year for your yearly visit, or sooner if needed

## 2016-03-28 NOTE — Assessment & Plan Note (Signed)
?   Tendonitis vs arthritis, for referral Dr Tamala Julian- sports med in this office

## 2016-03-28 NOTE — Assessment & Plan Note (Signed)
stable overall by history and exam, recent data reviewed with pt, and pt to continue medical treatment as before,  to f/u any worsening symptoms or concerns Lab Results  Component Value Date   LDLCALC 55 03/25/2015

## 2016-03-28 NOTE — Progress Notes (Signed)
Subjective:    Patient ID: [name], male    DOB: 1940/12/14, 75 y.o.   MRN: [id number]  HPI  Here for yearly f/u;  Overall doing ok;  Pt denies Chest pain, worsening SOB, DOE, wheezing, orthopnea, PND, worsening LE edema, palpitations, dizziness or syncope.  Pt denies neurological change such as new headache, facial or extremity weakness.  Pt denies polydipsia, polyuria, or low sugar symptoms. Pt states overall good compliance with treatment and medications, good tolerability, and has been trying to follow appropriate diet.  Pt denies worsening depressive symptoms, suicidal ideation or panic. No fever, night sweats, wt loss, loss of appetite, or other constitutional symptoms.  Pt states good ability with ADL's, has low fall risk, home safety reviewed and adequate, no other significant changes in hearing or vision, and only occasionally active with exercise. Saw urology last year 05/2015 with elev PSA after prostatitis episode, subsequenly improved, so pt di not feel f/u in dec 2016 was needed. Asks for f/u psa here Denies urinary symptoms such as dysuria, frequency, urgency, flank pain, hematuria or n/v, fever, chills.  Also c/o left medial/dorsal foot pain recurring, steps the wrong way and pain and numbness start up that takes hours to get better, mod, intermittent, worse to walk, better to sit. Has seen Dr Noemi Chapel, given brace but too large and did not help. Declines f/u carotid today,. Past Medical History  Diagnosis Date  . Increased prostate specific antigen (PSA) velocity   . History of nephrolithiasis   . Prostatitis   . Hyperlipidemia   . CAD (coronary artery disease) 07/2008    2 BMS placed for MI, August, 2009  . Atrial fibrillation ([place])     post MI-amiodarone stopped 07/2008  . Anxiety   . Depression   . Benign prostatic hypertrophy   . Thrombocytopenia ([place])   . Hx of colonoscopy   . CORONARY ARTERY BYPASS GRAFT, HX OF 07/13/2009    August, 2009, Dr. Cyndia Bent  . HYPERLIPIDEMIA  02/09/2009    Qualifier: Diagnosis of  By: Jenny Reichmann MD, Hunt Oris   . NEPHROLITHIASIS, HX OF 02/09/2009    Qualifier: Diagnosis of  By: Jenny Reichmann MD, Hunt Oris   . PSA elevation 09/06/2012  . Ejection fraction     .  Marland Kitchen Hx of CABG   . Carotid artery disease without cerebral infarction [facility])    Past Surgical History  Procedure Laterality Date  . Prostate biopsy  2007    neg  . Rotator cuff repair      bilateral; Dr. Noemi Chapel  . Hemorrhoid surgery    . Hydrocele excision    . Coronary artery bypass graft  08/05/08    x5, LIMA to the LAD.  Marland Kitchen Coronary stent placement  07/2008    2 BMS to RCA  . Left leg broken      reports that he has never smoked. He has never used smokeless tobacco. He reports that he does not drink alcohol or use illicit drugs. family history includes Healthy in his brother; Leukemia in his father; Lung cancer in his mother. There is no history of Colon cancer, Rectal cancer, or Stomach cancer. Allergies  Allergen Reactions  . Niacin     REACTION: leg ache and itching   Current Outpatient Prescriptions on File Prior to Visit  Medication Sig Dispense Refill  . aspirin 81 MG EC tablet Take 1 tablet (81 mg total) by mouth daily. Swallow whole. 30 tablet 12  . Multiple Vitamin (MULTIVITAMIN) tablet Take  1 tablet by mouth daily.    . Naproxen Sodium (ALEVE PO) Take by mouth as needed. Wrist pain    . simvastatin (ZOCOR) 40 MG tablet TAKE ONE-HALF TABLET BY MOUTH AT BEDTIME 45 tablet 1  . simvastatin (ZOCOR) 40 MG tablet TAKE ONE-HALF TABLET BY MOUTH AT BEDTIME 45 tablet 0   No current facility-administered medications on file prior to visit.   Review of Systems  Constitutional: Negative for increased diaphoresis, or other activity, appetite or siginficant weight change other than noted HENT: Negative for worsening hearing loss, ear pain, facial swelling, mouth sores and neck stiffness.   Eyes: Negative for other worsening pain, redness or visual disturbance.  Respiratory: Negative  for choking or stridor Cardiovascular: Negative for other chest pain and palpitations.  Gastrointestinal: Negative for worsening diarrhea, blood in stool, or abdominal distention Genitourinary: Negative for hematuria, flank pain or change in urine volume.  Musculoskeletal: Negative for myalgias or other joint complaints.  Skin: Negative for other color change and wound or drainage.  Neurological: Negative for syncope and numbness. other than noted Hematological: Negative for adenopathy. or other swelling Psychiatric/Behavioral: Negative for hallucinations, SI, self-injury, decreased concentration or other worsening agitation.      Objective:   Physical Exam BP 118/70 mmHg  Pulse 56  Resp 20  Wt 137 lb (62.143 kg)  SpO2 99% VS noted,  Constitutional: Pt is oriented to person, place, and time. Appears well-developed and well-nourished, in no significant distress Head: Normocephalic and atraumatic  Eyes: Conjunctivae and EOM are normal. Pupils are equal, round, and reactive to light Right Ear: External ear normal.  Left Ear: External ear normal Nose: Nose normal.  Mouth/Throat: Oropharynx is clear and moist  Neck: Normal range of motion. Neck supple. No JVD present. No tracheal deviation present or significant neck LA or mass Cardiovascular: Normal rate, regular rhythm, normal heart sounds and intact distal pulses.   Pulmonary/Chest: Effort normal and breath sounds without rales or wheezing  Abdominal: Soft. Bowel sounds are normal. NT. No HSM  Musculoskeletal: Normal range of motion. Exhibits no edema Lymphadenopathy: Has no cervical adenopathy.  Neurological: Pt is alert and oriented to person, place, and time. Pt has normal reflexes. No cranial nerve deficit. Motor grossly intact Skin: Skin is warm and dry. No rash noted or new ulcers Psychiatric:  Has normal mood and affect. Behavior is normal.  Left dorsal mid foot without swelling, red, and o/w neurovasc intact Not depressed  affect  Most recent Echo sept 2014 summary Result status: Final result    [facility]          [address] [place], [place] [address]            [phone]  ------------------------------------------------------------ Transthoracic Echocardiography  Patient:  [name], [name] MR #:    [id number] Study Date: 09/02/2013 Gender:   M Age:    22 Height:   165.1cm Weight:   60.3kg BSA:    1.53m^2 Pt. Status: Room:  ATTENDING  Katz, Jeffrey ORDERING   [place], [facility] SONOGRAPHER Victorio Palm, RDCS cc:  ------------------------------------------------------------ LV EF: 50% -  55%  ------------------------------------------------------------ Indications:   Atrial fibrillation - 427.31. CAD of native vessels 414.01. EF<50% 785.9  ------------------------------------------------------------ History:  PMH: Acquired from the patient and from the patient's chart. Atrial fibrillation. Atrial fibrillation. Coronary artery disease. PMH:  Myocardial infarction. Risk factors: Dyslipidemia.  ------------------------------------------------------------  Study Conclusions  - Left ventricle: The cavity size was normal. Wall thickness was normal. Systolic function was normal. The estimated ejection fraction was in the range of 50% to 55%. Wall motion was normal; there were no regional wall motion abnormalities. Left ventricular diastolic function parameters were normal. - Aortic valve: Structurally normal valve. Trileaflet. No regurgitation. - Mitral valve: Mild regurgitation. - Left atrium: The atrium was normal in size. - Right ventricle: The cavity size was mildly dilated. Systolic function was normal. - Right atrium: The atrium was normal in size. - Atrial septum: No defect or patent foramen ovale  was identified. - Tricuspid valve: Trivial regurgitation. - Pulmonary arteries: PA peak pressure: 82mm Hg (S). - Pericardium, extracardiac: There was no pericardial effusion.    Most recent carotid doppler comment per Dr Katz/card" Notes Recorded by Carlena Bjornstad, MD on 08/27/2013 at 12:02 PM Please let the patient know that the carotid Doppler shows very minimal carotid disease. This is not of concern and is not a surprise. Followup scan will be planned for 2 years.     Assessment & Plan:

## 2016-03-28 NOTE — Assessment & Plan Note (Signed)
stable overall by history and exam, recent data reviewed with pt, and pt to continue medical treatment as before,  to f/u any worsening symptoms or concerns Lab Results  Component Value Date   WBC 7.0 03/25/2015   HGB 14.6 03/25/2015   HCT 42.8 03/25/2015   PLT 121.0* 03/25/2015   GLUCOSE 96 03/25/2015   CHOL 129 03/25/2015   TRIG 127.0 03/25/2015   HDL 48.60 03/25/2015   LDLCALC 55 03/25/2015   ALT 13 03/25/2015   AST 26 03/25/2015   NA 138 03/25/2015   K 4.7 03/25/2015   CL 103 03/25/2015   CREATININE 0.99 03/25/2015   BUN 12 03/25/2015   CO2 32 03/25/2015   TSH 2.09 03/25/2015   PSA 9.06* 03/25/2015   HGBA1C 5.7 02/04/2009

## 2016-03-28 NOTE — Progress Notes (Signed)
Pre visit review using our clinic review tool, if applicable. No additional management support is needed unless otherwise documented below in the visit note. 

## 2016-03-28 NOTE — Assessment & Plan Note (Signed)
Due to prostatitis last yr per urology, for f/u PSA

## 2016-03-29 ENCOUNTER — Encounter: Payer: Self-pay | Admitting: Family Medicine

## 2016-03-29 ENCOUNTER — Ambulatory Visit (INDEPENDENT_AMBULATORY_CARE_PROVIDER_SITE_OTHER): Payer: Medicare Other | Admitting: Family Medicine

## 2016-03-29 VITALS — BP 112/70 | HR 65 | Ht 65.0 in | Wt 140.0 lb

## 2016-03-29 DIAGNOSIS — M76829 Posterior tibial tendinitis, unspecified leg: Secondary | ICD-10-CM | POA: Insufficient documentation

## 2016-03-29 DIAGNOSIS — Q667 Congenital pes cavus, unspecified foot: Secondary | ICD-10-CM | POA: Insufficient documentation

## 2016-03-29 DIAGNOSIS — I2510 Atherosclerotic heart disease of native coronary artery without angina pectoris: Secondary | ICD-10-CM

## 2016-03-29 DIAGNOSIS — M76822 Posterior tibial tendinitis, left leg: Secondary | ICD-10-CM

## 2016-03-29 DIAGNOSIS — M19079 Primary osteoarthritis, unspecified ankle and foot: Secondary | ICD-10-CM | POA: Insufficient documentation

## 2016-03-29 DIAGNOSIS — M129 Arthropathy, unspecified: Secondary | ICD-10-CM | POA: Diagnosis not present

## 2016-03-29 NOTE — Assessment & Plan Note (Signed)
Mild in nature. Discussed icing. Discussed which activities to do an which was to avoid. Patient will try topical anti-inflammatories and having pain. Follow-up in 4 weeks

## 2016-03-29 NOTE — Progress Notes (Signed)
[name] [place], [place] [address] Phone: [phone] Subjective:    I'm seeing this patient by the request  of:  Cathlean Cower, MD   CC: left ankle and foot pain  [id number] [name] is a 75 y.o. male coming in with complaint of left ankle and foot pain. Patient is had this pain for multiple months. States that sometimes he has a sharp pain on the anterior aspect of the ankle and seems to cause him to almost fall. Has a past medical history significant for a bad ankle fracture that didn't need plating greater than 20 years ago. States that it is slowly seemed to be worsening. Patient states that certain shoes seem to make it worse. Sometimes as long as he gets the right shoes and lay some appropriate he has no pain. Other times this will be a sharp pain that lasts seconds and then seems to resolve. Never associated with any swelling. States though sometimes when he does a lot of walking he has more of a medial side ankle and foot pain. Denies any swelling as well. Patient likes to golf usually 4-5 times a week. Most of the time he does well but sometimes he can his discomfort. Tries to stay away from over-the-counter medications but does respond to anti-inflammatories when needed. No numbness. No weakness.     Past Medical History  Diagnosis Date  . Increased prostate specific antigen (PSA) velocity   . History of nephrolithiasis   . Prostatitis   . Hyperlipidemia   . CAD (coronary artery disease) 07/2008    2 BMS placed for MI, August, 2009  . Atrial fibrillation ([place])     post MI-amiodarone stopped 07/2008  . Anxiety   . Depression   . Benign prostatic hypertrophy   . Thrombocytopenia ([place])   . Hx of colonoscopy   . CORONARY ARTERY BYPASS GRAFT, HX OF 07/13/2009    August, 2009, Dr. Cyndia Bent  . HYPERLIPIDEMIA 02/09/2009    Qualifier: Diagnosis of  By: Jenny Reichmann MD, Hunt Oris   . NEPHROLITHIASIS, HX OF 02/09/2009    Qualifier: Diagnosis of   By: Jenny Reichmann MD, Hunt Oris   . PSA elevation 09/06/2012  . Ejection fraction     .  Marland Kitchen Hx of CABG   . Carotid artery disease without cerebral infarction [facility])    Past Surgical History  Procedure Laterality Date  . Prostate biopsy  2007    neg  . Rotator cuff repair      bilateral; Dr. Noemi Chapel  . Hemorrhoid surgery    . Hydrocele excision    . Coronary artery bypass graft  08/05/08    x5, LIMA to the LAD.  Marland Kitchen Coronary stent placement  07/2008    2 BMS to RCA  . Left leg broken     Social History   Social History  . Marital Status: Married    Spouse Name: N/A  . Number of Children: 1  . Years of Education: N/A   Social History Main Topics  . Smoking status: Never Smoker   . Smokeless tobacco: Never Used  . Alcohol Use: No  . Drug Use: No  . Sexual Activity: Not Asked   Other Topics Concern  . None   Social History Narrative   Allergies  Allergen Reactions  . Niacin     REACTION: leg ache and itching   Family History  Problem Relation Age of Onset  . Leukemia Father   .  Lung cancer Mother   . Colon cancer Neg Hx   . Rectal cancer Neg Hx   . Stomach cancer Neg Hx   . Healthy Brother     Past medical history, social, surgical and family history all reviewed in electronic medical record.  No pertanent information unless stated regarding to the chief complaint.   Review of Systems: No headache, visual changes, nausea, vomiting, diarrhea, constipation, dizziness, abdominal pain, skin rash, fevers, chills, night sweats, weight loss, swollen lymph nodes, body aches, joint swelling, muscle aches, chest pain, shortness of breath, mood changes.   Objective Blood pressure 112/70, pulse 65, height 5\' 5"  (1.651 m), weight 140 lb (63.504 kg), SpO2 98 %.  General: No apparent distress alert and oriented x3 mood and affect normal, dressed appropriately.  HEENT: Pupils equal, extraocular movements intact  Respiratory: Patient's speak in full sentences and does not appear short of  breath  Cardiovascular: No lower extremity edema, non tender, no erythema  Skin: Warm dry intact with no signs of infection or rash on extremities or on axial skeleton.  Abdomen: Soft nontender  Neuro: Cranial nerves II through XII are intact, neurovascularly intact in all extremities with 2+ DTRs and 2+ pulses.  Lymph: No lymphadenopathy of posterior or anterior cervical chain or axillae bilaterally.  Gait normal with good balance and coordination.  MSK:  Non tender with full range of motion and good stability and symmetric strength and tone of shoulders, elbows, wrist, hip, knee bilaterally.  Ankle:left ankle No visible erythema or swelling.patient does have a significant high rigid arch of the midfootpatient also has atrophy of the musculature of the lower extremity compared to the contralateral side Ankle lacks last 10 of dorsiflexion in the last 5 of plantarflexion Strength is 4/5 in all directions. Stable lateral and medial ligaments; squeeze test and kleiger test unremarkable; Talar dome nontender; No pain at base of 5th MT; No tenderness over cuboid; No tenderness over N spot or navicular prominence No tenderness on posterior aspects of lateral and medial malleolusmild discomfort over the posterior tibialis tendon No sign of peroneal tendon subluxations or tenderness to palpation Negative tarsal tunnel tinel's Able to walk 4 steps.  MSK US performed of: *left ankle This study was ordered, performed, and interpreted by Charlann Boxer D.O.  Foot/Ankle:   All structures visualized.   Talar dome does have narrowing Ankle mortise without effusion. Peroneus longus and brevis tendons unremarkable on long and transverse views without sheath effusions. Posterior tibialishypoechoic changes just proximal to the area of insertion on the navicular bone, flexor hallucis longus, and flexor digitorum longus tendons unremarkable on long and transverse views without sheath effusions. Achilles  tendon visualized along length of tendon and unremarkable on long and transverse views without sheath effusion. Anterior Talofibular Ligament and Calcaneofibular Ligaments unremarkable and intact. Midfoot does show that patient does have mild to moderate arthritic changes. Due to computer error images saved and internal or drug  IMPRESSION:  Moderate midfoot arthritis as well as moderate ankle arthritis with mild posterior tibialis tendinitis     Impression and Recommendations:     This case required medical decision making of moderate complexity.      Note: This dictation was prepared with Dragon dictation along with smaller phrase technology. Any transcriptional errors that result from this process are unintentional.

## 2016-03-29 NOTE — Progress Notes (Signed)
Pre visit review using our clinic review tool, if applicable. No additional management support is needed unless otherwise documented below in the visit note. 

## 2016-03-29 NOTE — Assessment & Plan Note (Signed)
Patient does have a high arch of the foot bilaterally. Patient has a rigid midfoot that is also contributing to the discomfort. Patient does have some mild anterior impingement secondary to his previous ankle fracture. Patient is not wearing the correct shoes. We discussed over-the-counter orthotics, icing, given trial topical anti-inflammatories, we discussed which activities to do in which ones to avoid. We discussed avoiding being barefoot. I do believe the patient will do well with conservative therapy. Patient will follow-up with me again in 4 weeks to make sure he is responding well to conservative therapy.

## 2016-03-29 NOTE — Assessment & Plan Note (Signed)
Discussed with support for this. Discussed over-the-counter medications. We discussed icing. Patient come back and see me again in 4 weeks for further evaluation. Possible injections may be necessary.

## 2016-03-29 NOTE — Patient Instructions (Signed)
Good to see you.  Ice 20 minutes 2 times daily. Usually after activity and before bed. pennsaid pinkie amount topically 2 times daily as needed.  Vitamin D 2000 IU daily  Turmeric 500mg  twice daily to help with inflammation.  Tart cherry extract at night any dose.  Spenco orthotics "total support" online would be great  Try Newtons for your running/walking shoe, I think you will like them  No running or jumping but otherwise what ever you want.  See me again 4 weeks if not perfect

## 2016-04-04 ENCOUNTER — Other Ambulatory Visit: Payer: Self-pay | Admitting: Internal Medicine

## 2016-06-01 DIAGNOSIS — H2513 Age-related nuclear cataract, bilateral: Secondary | ICD-10-CM | POA: Diagnosis not present

## 2016-06-01 DIAGNOSIS — Z0100 Encounter for examination of eyes and vision without abnormal findings: Secondary | ICD-10-CM | POA: Diagnosis not present

## 2016-07-06 ENCOUNTER — Other Ambulatory Visit: Payer: Self-pay | Admitting: Internal Medicine

## 2016-09-29 ENCOUNTER — Other Ambulatory Visit: Payer: Self-pay | Admitting: Internal Medicine

## 2016-10-27 ENCOUNTER — Encounter: Payer: Self-pay | Admitting: Cardiovascular Disease

## 2016-11-08 ENCOUNTER — Encounter (INDEPENDENT_AMBULATORY_CARE_PROVIDER_SITE_OTHER): Payer: Self-pay

## 2016-11-08 ENCOUNTER — Ambulatory Visit (INDEPENDENT_AMBULATORY_CARE_PROVIDER_SITE_OTHER): Payer: Medicare Other | Admitting: Cardiovascular Disease

## 2016-11-08 ENCOUNTER — Encounter: Payer: Self-pay | Admitting: Cardiovascular Disease

## 2016-11-08 VITALS — BP 124/78 | HR 58 | Ht 65.0 in | Wt 137.1 lb

## 2016-11-08 DIAGNOSIS — E782 Mixed hyperlipidemia: Secondary | ICD-10-CM

## 2016-11-08 DIAGNOSIS — I2510 Atherosclerotic heart disease of native coronary artery without angina pectoris: Secondary | ICD-10-CM | POA: Diagnosis not present

## 2016-11-08 DIAGNOSIS — I480 Paroxysmal atrial fibrillation: Secondary | ICD-10-CM | POA: Diagnosis not present

## 2016-11-08 LAB — COMPREHENSIVE METABOLIC PANEL
ALT: 15 U/L (ref 9–46)
AST: 28 U/L (ref 10–35)
Albumin: 4.4 g/dL (ref 3.6–5.1)
Alkaline Phosphatase: 41 U/L (ref 40–115)
BILIRUBIN TOTAL: 1 mg/dL (ref 0.2–1.2)
BUN: 13 mg/dL (ref 7–25)
CALCIUM: 9.5 mg/dL (ref 8.6–10.3)
CHLORIDE: 101 mmol/L (ref 98–110)
CO2: 29 mmol/L (ref 20–31)
Creat: 1.18 mg/dL (ref 0.70–1.18)
GLUCOSE: 101 mg/dL — AB (ref 65–99)
Potassium: 4.1 mmol/L (ref 3.5–5.3)
SODIUM: 138 mmol/L (ref 135–146)
Total Protein: 7.1 g/dL (ref 6.1–8.1)

## 2016-11-08 LAB — LIPID PANEL
CHOL/HDL RATIO: 3.2 ratio (ref <5.0)
Cholesterol: 137 mg/dL (ref <200)
HDL: 43 mg/dL (ref >40)
LDL Cholesterol: 59 mg/dL (ref <100)
Triglycerides: 175 mg/dL — ABNORMAL HIGH (ref <150)
VLDL: 35 mg/dL — AB (ref <30)

## 2016-11-08 NOTE — Patient Instructions (Signed)
Medication Instructions:  Your physician recommends that you continue on your current medications as directed. Please refer to the Current Medication list given to you today.   Labwork: TODAY - complete metabolic panel, cholesterol   Testing/Procedures: None Ordered   Follow-Up: Your physician wants you to follow-up in: 1 year with Dr. Nahser. You will receive a reminder letter in the mail two months in advance. If you don't receive a letter, please call our office to schedule the follow-up appointment.   If you need a refill on your cardiac medications before your next appointment, please call your pharmacy.   Thank you for choosing [facility]! Taijah Macrae, RN [phone]    

## 2016-11-08 NOTE — Progress Notes (Signed)
Cardiology Office Note   Date:  11/08/2016   ID:  [name], [name] [birth year], MRN [id number]  PCP:  Cathlean Cower, MD  Cardiologist:   Mertie Moores, MD   Transfer from Dr. Ron Parker   Chief Complaint  Patient presents with  . Follow-up    atrial fib   Problem list 1. Coronary artery disease - MI - had emergent BMS 2009 followed by emergent CABG 2. Hyperlipidemia  3. Essential hypertension 4. History of atrial fibrillation     [name] is a 74 y.o. male who presents for followed up of CAD No CP, Plays golf regularly , works out regularly Retired from [employer] .   Nov. 29, 2017:  Doing well. Exercising regularly.   Playing golf regularly .  Played the course at [facility] [employer] [facility])     Past Medical History:  Diagnosis Date  . Anxiety   . Atrial fibrillation ([place])    post MI-amiodarone stopped 07/2008  . Benign prostatic hypertrophy   . CAD (coronary artery disease) 07/2008   2 BMS placed for MI, August, 2009  . Carotid artery disease without cerebral infarction ([place])   . CORONARY ARTERY BYPASS GRAFT, HX OF 07/13/2009   August, 2009, Dr. Cyndia Bent  . Depression   . Ejection fraction    .  Marland Kitchen History of nephrolithiasis   . Hx of CABG   . Hx of colonoscopy   . Hyperlipidemia   . HYPERLIPIDEMIA 02/09/2009   Qualifier: Diagnosis of  By: Jenny Reichmann MD, Hunt Oris   . Increased prostate specific antigen (PSA) velocity   . NEPHROLITHIASIS, HX OF 02/09/2009   Qualifier: Diagnosis of  By: Jenny Reichmann MD, Hunt Oris   . Prostatitis   . PSA elevation 09/06/2012  . Thrombocytopenia ([place])     Past Surgical History:  Procedure Laterality Date  . CORONARY ARTERY BYPASS GRAFT  08/05/08   x5, LIMA to the LAD.  Marland Kitchen CORONARY STENT PLACEMENT  07/2008   2 BMS to RCA  . HEMORRHOID SURGERY    . HYDROCELE EXCISION    . left leg broken    . PROSTATE BIOPSY  2007   neg  . ROTATOR CUFF REPAIR     bilateral; Dr. Noemi Chapel     Current Outpatient Prescriptions  Medication Sig Dispense  Refill  . aspirin 81 MG EC tablet Take 1 tablet (81 mg total) by mouth daily. Swallow whole. 30 tablet 12  . Multiple Vitamin (MULTIVITAMIN) tablet Take 1 tablet by mouth daily.    . Naproxen Sodium (ALEVE PO) Take by mouth as needed. Wrist pain    . simvastatin (ZOCOR) 40 MG tablet TAKE ONE-HALF TABLET BY MOUTH AT BEDTIME 45 tablet 0   No current facility-administered medications for this visit.     Allergies:   Niacin    Social History:  The patient  reports that he has never smoked. He has never used smokeless tobacco. He reports that he does not drink alcohol or use drugs.   Family History:  The patient's family history includes Healthy in his brother; Leukemia in his father; Lung cancer in his mother.    ROS:  Please see the history of present illness.    Review of Systems: Constitutional:  denies fever, chills, diaphoresis, appetite change and fatigue.  HEENT: denies photophobia, eye pain, redness, hearing loss, ear pain, congestion, sore throat, rhinorrhea, sneezing, neck pain, neck stiffness and tinnitus.  Respiratory: denies SOB, DOE, cough, chest tightness, and wheezing.  Cardiovascular:  denies chest pain, palpitations and leg swelling.  Gastrointestinal: denies nausea, vomiting, abdominal pain, diarrhea, constipation, blood in stool.  Genitourinary: denies dysuria, urgency, frequency, hematuria, flank pain and difficulty urinating.  Musculoskeletal: denies  myalgias, back pain, joint swelling, arthralgias and gait problem.   Skin: denies pallor, rash and wound.  Neurological: denies dizziness, seizures, syncope, weakness, light-headedness, numbness and headaches.   Hematological: denies adenopathy, easy bruising, personal or family bleeding history.  Psychiatric/ Behavioral: denies suicidal ideation, mood changes, confusion, nervousness, sleep disturbance and agitation.       All other systems are reviewed and negative.    PHYSICAL EXAM: VS:  BP 124/78 (BP Location:  Left Arm, Patient Position: Sitting, Cuff Size: Normal)   Pulse (!) 58   Ht 5\' 5"  (1.651 m)   Wt 137 lb 1.9 oz (62.2 kg)   BMI 22.82 kg/m  , BMI Body mass index is 22.82 kg/m. GEN: Well nourished, well developed, in no acute distress  HEENT: normal  Neck: no JVD, carotid bruits, or masses Cardiac: RRR; no murmurs, rubs, or gallops,no edema  Respiratory:  clear to auscultation bilaterally, normal work of breathing GI: soft, nontender, nondistended, + BS MS: no deformity or atrophy  Skin: warm and dry, no rash Neuro:  Strength and sensation are intact Psych: normal   EKG:  EKG is ordered today. The ekg ordered today demonstrates  Sinus brady at 58.  Otherwise normal ECG  Recent Labs: 03/28/2016: ALT 13; BUN 15; Creatinine, Ser 1.08; Hemoglobin 14.5; Platelets 125.0; Potassium 4.9; Sodium 141; TSH 2.09    Lipid Panel    Component Value Date/Time   CHOL 128 03/28/2016 0915   TRIG 111.0 03/28/2016 0915   HDL 42.40 03/28/2016 0915   CHOLHDL 3 03/28/2016 0915   VLDL 22.2 03/28/2016 0915   LDLCALC 63 03/28/2016 0915      Wt Readings from Last 3 Encounters:  11/08/16 137 lb 1.9 oz (62.2 kg)  03/29/16 140 lb (63.5 kg)  03/28/16 137 lb (62.1 kg)      Other studies Reviewed: Additional studies/ records that were reviewed today include: . Review of the above records demonstrates:    ASSESSMENT AND PLAN:  1. Coronary artery disease - MI - had emergent BMS 2009 followed by emergent CABG Doing well.  No angina   2. Hyperlipidemia  - continue simvastatin   3. Essential hypertension -  4. History of atrial fibrillation   Current medicines are reviewed at length with the patient today.  The patient does not have concerns regarding medicines.  The following changes have been made:  no change  Labs/ tests ordered today include:  No orders of the defined types were placed in this encounter.    Disposition:   FU with me in 1 year      Mertie Moores, MD  11/08/2016  9:23 AM    [address], [place], [place]  [address] Phone: [phone]; Fax: [phone]

## 2016-11-09 ENCOUNTER — Telehealth: Payer: Self-pay | Admitting: Cardiovascular Disease

## 2016-11-09 NOTE — Telephone Encounter (Signed)
Pt is aware of lab results.

## 2016-11-09 NOTE — Telephone Encounter (Signed)
Returning a call . Please call

## 2017-01-03 ENCOUNTER — Ambulatory Visit: Payer: Medicare Other | Admitting: Internal Medicine

## 2017-01-07 ENCOUNTER — Other Ambulatory Visit: Payer: Self-pay | Admitting: Internal Medicine

## 2017-02-23 DIAGNOSIS — H52203 Unspecified astigmatism, bilateral: Secondary | ICD-10-CM | POA: Diagnosis not present

## 2017-02-23 DIAGNOSIS — H2513 Age-related nuclear cataract, bilateral: Secondary | ICD-10-CM | POA: Diagnosis not present

## 2017-03-19 ENCOUNTER — Other Ambulatory Visit: Payer: Self-pay | Admitting: Internal Medicine

## 2017-03-20 ENCOUNTER — Telehealth: Payer: Self-pay | Admitting: Internal Medicine

## 2017-03-20 NOTE — Telephone Encounter (Signed)
Lm for pt to schedule a physical with Dr Jenny Reichmann for prescription refills per [place].

## 2017-03-22 ENCOUNTER — Telehealth: Payer: Self-pay | Admitting: Internal Medicine

## 2017-03-22 ENCOUNTER — Other Ambulatory Visit: Payer: Self-pay | Admitting: Internal Medicine

## 2017-03-22 NOTE — Telephone Encounter (Signed)
Pt wants to know why his refills were denied when he has an appt setup already on the 27th for a cpe ?  He would like a call back

## 2017-03-23 MED ORDER — SIMVASTATIN 40 MG PO TABS: 20.0000 mg | ORAL_TABLET | Freq: Every day | ORAL | 0 refills | Status: DC

## 2017-03-23 NOTE — Telephone Encounter (Signed)
Spoke with pt and apologized. I explained to him that I had overlooked the fact he had an appt scheduled. I sent in Simvastatin to his pharmacy.

## 2017-03-27 DIAGNOSIS — H2511 Age-related nuclear cataract, right eye: Secondary | ICD-10-CM | POA: Diagnosis not present

## 2017-03-27 DIAGNOSIS — H25811 Combined forms of age-related cataract, right eye: Secondary | ICD-10-CM | POA: Diagnosis not present

## 2017-03-29 ENCOUNTER — Encounter: Payer: Medicare Other | Admitting: Internal Medicine

## 2017-04-18 ENCOUNTER — Encounter: Payer: Self-pay | Admitting: Internal Medicine

## 2017-04-18 ENCOUNTER — Ambulatory Visit (INDEPENDENT_AMBULATORY_CARE_PROVIDER_SITE_OTHER): Payer: Medicare Other | Admitting: Internal Medicine

## 2017-04-18 ENCOUNTER — Other Ambulatory Visit (INDEPENDENT_AMBULATORY_CARE_PROVIDER_SITE_OTHER): Payer: Medicare Other

## 2017-04-18 VITALS — BP 138/82 | HR 68 | Ht 63.0 in | Wt 133.0 lb

## 2017-04-18 DIAGNOSIS — E785 Hyperlipidemia, unspecified: Secondary | ICD-10-CM | POA: Diagnosis not present

## 2017-04-18 DIAGNOSIS — R739 Hyperglycemia, unspecified: Secondary | ICD-10-CM

## 2017-04-18 DIAGNOSIS — R972 Elevated prostate specific antigen [PSA]: Secondary | ICD-10-CM | POA: Diagnosis not present

## 2017-04-18 DIAGNOSIS — D696 Thrombocytopenia, unspecified: Secondary | ICD-10-CM

## 2017-04-18 LAB — CBC WITH DIFFERENTIAL/PLATELET
BASOS PCT: 0.4 % (ref 0.0–3.0)
Basophils Absolute: 0 10*3/uL (ref 0.0–0.1)
EOS PCT: 1.8 % (ref 0.0–5.0)
Eosinophils Absolute: 0.1 10*3/uL (ref 0.0–0.7)
HEMATOCRIT: 46.1 % (ref 39.0–52.0)
HEMOGLOBIN: 15.6 g/dL (ref 13.0–17.0)
Lymphocytes Relative: 23.5 % (ref 12.0–46.0)
Lymphs Abs: 1.8 10*3/uL (ref 0.7–4.0)
MCHC: 33.8 g/dL (ref 30.0–36.0)
MCV: 90.6 fl (ref 78.0–100.0)
MONO ABS: 0.7 10*3/uL (ref 0.1–1.0)
Monocytes Relative: 8.7 % (ref 3.0–12.0)
NEUTROS ABS: 5.2 10*3/uL (ref 1.4–7.7)
Neutrophils Relative %: 65.6 % (ref 43.0–77.0)
PLATELETS: 147 10*3/uL — AB (ref 150.0–400.0)
RBC: 5.09 Mil/uL (ref 4.22–5.81)
RDW: 12.4 % (ref 11.5–15.5)
WBC: 7.9 10*3/uL (ref 4.0–10.5)

## 2017-04-18 LAB — URINALYSIS, ROUTINE W REFLEX MICROSCOPIC
HGB URINE DIPSTICK: NEGATIVE
Ketones, ur: NEGATIVE
Leukocytes, UA: NEGATIVE
NITRITE: NEGATIVE
RBC / HPF: NONE SEEN (ref 0–?)
SPECIFIC GRAVITY, URINE: 1.025 (ref 1.000–1.030)
Total Protein, Urine: NEGATIVE
Urine Glucose: NEGATIVE
Urobilinogen, UA: 0.2 (ref 0.0–1.0)
WBC UA: NONE SEEN (ref 0–?)
pH: 6 (ref 5.0–8.0)

## 2017-04-18 LAB — HEPATIC FUNCTION PANEL
ALT: 15 U/L (ref 0–53)
AST: 24 U/L (ref 0–37)
Albumin: 4.7 g/dL (ref 3.5–5.2)
Alkaline Phosphatase: 53 U/L (ref 39–117)
BILIRUBIN DIRECT: 0.2 mg/dL (ref 0.0–0.3)
BILIRUBIN TOTAL: 0.9 mg/dL (ref 0.2–1.2)
Total Protein: 7.5 g/dL (ref 6.0–8.3)

## 2017-04-18 LAB — BASIC METABOLIC PANEL
BUN: 15 mg/dL (ref 6–23)
CHLORIDE: 102 meq/L (ref 96–112)
CO2: 31 mEq/L (ref 19–32)
Calcium: 9.9 mg/dL (ref 8.4–10.5)
Creatinine, Ser: 1.23 mg/dL (ref 0.40–1.50)
GFR: 60.94 mL/min (ref >60.00)
Glucose, Bld: 120 mg/dL — ABNORMAL HIGH (ref 70–99)
POTASSIUM: 4.8 meq/L (ref 3.5–5.1)
SODIUM: 140 meq/L (ref 135–145)

## 2017-04-18 LAB — HEMOGLOBIN A1C: Hgb A1c MFr Bld: 6.2 % (ref 4.6–6.5)

## 2017-04-18 LAB — LIPID PANEL
Cholesterol: 138 mg/dL (ref 0–200)
HDL: 55.7 mg/dL (ref >39.00)
LDL CALC: 60 mg/dL (ref 0–99)
NONHDL: 82.42
TRIGLYCERIDES: 114 mg/dL (ref 0.0–149.0)
Total CHOL/HDL Ratio: 2
VLDL: 22.8 mg/dL (ref 0.0–40.0)

## 2017-04-18 LAB — TSH: TSH: 2.29 u[IU]/mL (ref 0.35–4.50)

## 2017-04-18 LAB — PSA: PSA: 8.68 ng/mL — AB (ref 0.10–4.00)

## 2017-04-18 NOTE — Progress Notes (Signed)
Subjective:    Patient ID: [name], male    DOB: 09-25-1941, 76 y.o.   MRN: [id number]  HPI  Here for yearly f/u;  Overall doing ok;  Pt denies Chest pain, worsening SOB, DOE, wheezing, orthopnea, PND, worsening LE edema, palpitations, dizziness or syncope.  Pt denies neurological change such as new headache, facial or extremity weakness.  Pt denies polydipsia, polyuria, or low sugar symptoms. Pt states overall good compliance with treatment and medications, good tolerability, and has been trying to follow appropriate diet.  Pt denies worsening depressive symptoms, suicidal ideation or panic. No fever, night sweats, wt loss, loss of appetite, or other constitutional symptoms.  Pt states good ability with ADL's, has low fall risk, home safety reviewed and adequate, no other significant changes in hearing or vision, and occasionally active with exercise with golf three times per wk, no new complaints  Past Medical History:  Diagnosis Date  . Anxiety   . Atrial fibrillation ([place])    post MI-amiodarone stopped 07/2008  . Benign prostatic hypertrophy   . CAD (coronary artery disease) 07/2008   2 BMS placed for MI, August, 2009  . Carotid artery disease without cerebral infarction ([place])   . CORONARY ARTERY BYPASS GRAFT, HX OF 07/13/2009   August, 2009, Dr. Cyndia Bent  . Depression   . Ejection fraction    .  Marland Kitchen History of nephrolithiasis   . Hx of CABG   . Hx of colonoscopy   . Hyperlipidemia   . HYPERLIPIDEMIA 02/09/2009   Qualifier: Diagnosis of  By: Jenny Reichmann MD, Hunt Oris   . Increased prostate specific antigen (PSA) velocity   . NEPHROLITHIASIS, HX OF 02/09/2009   Qualifier: Diagnosis of  By: Jenny Reichmann MD, Hunt Oris   . Prostatitis   . PSA elevation 09/06/2012  . Thrombocytopenia ([place])    Past Surgical History:  Procedure Laterality Date  . CORONARY ARTERY BYPASS GRAFT  08/05/08   x5, LIMA to the LAD.  Marland Kitchen CORONARY STENT PLACEMENT  07/2008   2 BMS to RCA  . HEMORRHOID SURGERY    . HYDROCELE EXCISION     . left leg broken    . PROSTATE BIOPSY  2007   neg  . ROTATOR CUFF REPAIR     bilateral; Dr. Noemi Chapel    reports that he has never smoked. He has never used smokeless tobacco. He reports that he does not drink alcohol or use drugs. family history includes Healthy in his brother; Leukemia in his father; Lung cancer in his mother. Allergies  Allergen Reactions  . Niacin     REACTION: leg ache and itching   Current Outpatient Prescriptions on File Prior to Visit  Medication Sig Dispense Refill  . aspirin 81 MG EC tablet Take 1 tablet (81 mg total) by mouth daily. Swallow whole. 30 tablet 12  . Multiple Vitamin (MULTIVITAMIN) tablet Take 1 tablet by mouth daily.    . Naproxen Sodium (ALEVE PO) Take by mouth as needed. Wrist pain    . simvastatin (ZOCOR) 40 MG tablet Take 0.5 tablets (20 mg total) by mouth at bedtime. 45 tablet 0   No current facility-administered medications on file prior to visit.    Review of Systems Constitutional: Negative for other unusual diaphoresis, sweats, appetite or weight changes HENT: Negative for other worsening hearing loss, ear pain, facial swelling, mouth sores or neck stiffness.   Eyes: Negative for other worsening pain, redness or other visual disturbance.  Respiratory: Negative for other stridor or swelling  Cardiovascular: Negative for other palpitations or other chest pain  Gastrointestinal: Negative for worsening diarrhea or loose stools, blood in stool, distention or other pain Genitourinary: Negative for hematuria, flank pain or other change in urine volume.  Musculoskeletal: Negative for myalgias or other joint swelling.  Skin: Negative for other color change, or other wound or worsening drainage.  Neurological: Negative for other syncope or numbness. Hematological: Negative for other adenopathy or swelling Psychiatric/Behavioral: Negative for hallucinations, other worsening agitation, SI, self-injury, or new decreased concentration No other  exam findings    Objective:   Physical Exam BP 138/82   Pulse 68   Ht 5\' 3"  (1.6 m)   Wt 133 lb (60.3 kg)   SpO2 98%   BMI 23.56 kg/m  VS noted, wt normal Constitutional: Pt is oriented to person, place, and time. Appears well-developed and well-nourished, in no significant distress and comfortable Head: Normocephalic and atraumatic  Eyes: Conjunctivae and EOM are normal. Pupils are equal, round, and reactive to light Right Ear: External ear normal without discharge Left Ear: External ear normal without discharge Nose: Nose without discharge or deformity Mouth/Throat: Oropharynx is without other ulcerations and moist  Neck: Normal range of motion. Neck supple. No JVD present. No tracheal deviation present or significant neck LA or mass Cardiovascular: Normal rate, regular rhythm, normal heart sounds and intact distal pulses.   Pulmonary/Chest: WOB normal and breath sounds without rales or wheezing  Abdominal: Soft. Bowel sounds are normal. NT. No HSM  Musculoskeletal: Normal range of motion. Exhibits no edema Lymphadenopathy: Has no other cervical adenopathy.  Neurological: Pt is alert and oriented to person, place, and time. Pt has normal reflexes. No cranial nerve deficit. Motor grossly intact, Gait intact Skin: Skin is warm and dry. No rash noted or new ulcerations Psychiatric:  Has nervous mood and affect. Behavior is normal without agitation No other exam findings      Assessment & Plan:

## 2017-04-18 NOTE — Assessment & Plan Note (Signed)
Chronic persistent due to statin, ok to follow

## 2017-04-18 NOTE — Assessment & Plan Note (Signed)
stable overall by history and exam, recent data reviewed with pt, and pt to continue medical treatment as before,  to f/u any worsening symptoms or concerns Lab Results  Component Value Date   HGBA1C 5.7 02/04/2009

## 2017-04-18 NOTE — Patient Instructions (Signed)
Please continue all other medications as before, and refills have been done if requested.  Please have the pharmacy call with any other refills you may need.  Please continue your efforts at being more active, low cholesterol diet, and weight control.  You are otherwise up to date with prevention measures today.  Please keep your appointments with your specialists as you may have planned  You should receive a letter before Nov 2018 regarding the colonoscopy follow up testing  Please go to the LAB in the Basement (turn left off the elevator) for the tests to be done today  You will be contacted by phone if any changes need to be made immediately.  Otherwise, you will receive a letter about your results with an explanation, but please check with MyChart first.  Please remember to sign up for MyChart if you have not done so, as this will be important to you in the future with finding out test results, communicating by private email, and scheduling acute appointments online when needed.  Please return in 1 year for your yearly visit, or sooner if needed

## 2017-04-18 NOTE — Assessment & Plan Note (Signed)
stable overall by history and exam, recent data reviewed with pt, and pt to continue medical treatment as before,  to f/u any worsening symptoms or concerns Lab Results  Component Value Date   LDLCALC 59 11/08/2016

## 2017-04-18 NOTE — Assessment & Plan Note (Signed)
chroniic mild elevated, for f/u today, o/w asympt

## 2017-05-15 DIAGNOSIS — D229 Melanocytic nevi, unspecified: Secondary | ICD-10-CM | POA: Diagnosis not present

## 2017-05-15 DIAGNOSIS — L570 Actinic keratosis: Secondary | ICD-10-CM | POA: Diagnosis not present

## 2017-05-15 DIAGNOSIS — L821 Other seborrheic keratosis: Secondary | ICD-10-CM | POA: Diagnosis not present

## 2017-07-18 ENCOUNTER — Other Ambulatory Visit: Payer: Self-pay | Admitting: Internal Medicine

## 2017-07-18 ENCOUNTER — Encounter: Payer: Self-pay | Admitting: Family Medicine

## 2017-07-18 ENCOUNTER — Ambulatory Visit (INDEPENDENT_AMBULATORY_CARE_PROVIDER_SITE_OTHER): Payer: Medicare Other | Admitting: Family Medicine

## 2017-07-18 ENCOUNTER — Ambulatory Visit: Payer: Self-pay

## 2017-07-18 VITALS — BP 120/80 | HR 58 | Ht 65.0 in | Wt 141.0 lb

## 2017-07-18 DIAGNOSIS — M66821 Spontaneous rupture of other tendons, right upper arm: Secondary | ICD-10-CM | POA: Diagnosis not present

## 2017-07-18 DIAGNOSIS — M7711 Lateral epicondylitis, right elbow: Secondary | ICD-10-CM

## 2017-07-18 DIAGNOSIS — M25521 Pain in right elbow: Secondary | ICD-10-CM

## 2017-07-18 DIAGNOSIS — S46111A Strain of muscle, fascia and tendon of long head of biceps, right arm, initial encounter: Secondary | ICD-10-CM

## 2017-07-18 NOTE — Assessment & Plan Note (Signed)
Lateral Epicondylitis: Elbow anatomy was reviewed, and tendinopathy was explained.  Pt. given a formal rehab program. Series of concentric and eccentric exercises should be done starting with no weight, work up to 1 lb, hammer, etc.  Use counterforce strap if working or using hands.  Formal PT would be beneficial. Emphasized stretching an cross-friction massage Emphasized proper palms up lifting biomechanics to unload ECRB rtc in 4 weeks 

## 2017-07-18 NOTE — Assessment & Plan Note (Signed)
Patient does have a partial tear noted. Also has more of a lateral epicondylitis. We discussed home exercises and work with Product/process development scientist, we discussed compression, avoiding certain activities as well as heavy lifting. Topical anti-inflammatories. Vitamin D for strength. Limit the amount of golfing. Follow-up in 4 weeks

## 2017-07-18 NOTE — Patient Instructions (Signed)
Good to see you  Bicep tendon tear and the lateral epicondylitis.  Ice 20 minutes 2 times daily. Usually after activity and before bed. Exercises 3 times a week.  Wrist brace day and night for 1 week then nightly for 2 weeks.  OK to golf 1-2 times a week pennsaid pinkie amount topically 2 times daily as needed.   Vitamin D 2000 IU daily  See me again in 3 weeks

## 2017-07-18 NOTE — Progress Notes (Signed)
[name] [address] [place], [place] [address] Phone: [phone] Subjective:    I'm seeing this patient by the request  of:  Biagio Borg, MD   CC: right elbow pain   [id number]  [name] is a 76 y.o. male coming in with complaint of right elbow pain new problem, golfs a lot. Notice pain with only golf for 2 months then worsening. Patient states that it is severe overall. No affecting daily activities. Difficulty with lifting. Sometimes aching at night      Past Medical History:  Diagnosis Date  . Anxiety   . Atrial fibrillation ([place])    post MI-amiodarone stopped 07/2008  . Benign prostatic hypertrophy   . CAD (coronary artery disease) 07/2008   2 BMS placed for MI, August, 2009  . Carotid artery disease without cerebral infarction ([place])   . CORONARY ARTERY BYPASS GRAFT, HX OF 07/13/2009   August, 2009, Dr. Cyndia Bent  . Depression   . Ejection fraction    .  Marland Kitchen History of nephrolithiasis   . Hx of CABG   . Hx of colonoscopy   . Hyperlipidemia   . HYPERLIPIDEMIA 02/09/2009   Qualifier: Diagnosis of  By: Jenny Reichmann MD, Hunt Oris   . Increased prostate specific antigen (PSA) velocity   . NEPHROLITHIASIS, HX OF 02/09/2009   Qualifier: Diagnosis of  By: Jenny Reichmann MD, Hunt Oris   . Prostatitis   . PSA elevation 09/06/2012  . Thrombocytopenia ([place])    Past Surgical History:  Procedure Laterality Date  . CORONARY ARTERY BYPASS GRAFT  08/05/08   x5, LIMA to the LAD.  Marland Kitchen CORONARY STENT PLACEMENT  07/2008   2 BMS to RCA  . HEMORRHOID SURGERY    . HYDROCELE EXCISION    . left leg broken    . PROSTATE BIOPSY  2007   neg  . ROTATOR CUFF REPAIR     bilateral; Dr. Noemi Chapel   Social History   Social History  . Marital status: Married    Spouse name: N/A  . Number of children: 1  . Years of education: N/A   Social History Main Topics  . Smoking status: Never Smoker  . Smokeless tobacco: Never Used  . Alcohol use No  . Drug use: No  . Sexual  activity: Not Asked   Other Topics Concern  . None   Social History Narrative  . None   Allergies  Allergen Reactions  . Niacin     REACTION: leg ache and itching   Family History  Problem Relation Age of Onset  . Lung cancer Mother   . Leukemia Father   . Healthy Brother   . Colon cancer Neg Hx   . Rectal cancer Neg Hx   . Stomach cancer Neg Hx      Past medical history, social, surgical and family history all reviewed in electronic medical record.  No pertanent information unless stated regarding to the chief complaint.   Review of Systems:Review of systems updated and as accurate as of 07/18/17  No headache, visual changes, nausea, vomiting, diarrhea, constipation, dizziness, abdominal pain, skin rash, fevers, chills, night sweats, weight loss, swollen lymph nodes, body aches, joint swelling, muscle aches, chest pain, shortness of breath, mood changes.   Objective  Height 5\' 5"  (1.651 m), weight 141 lb (64 kg). Systems examined below as of 07/18/17   General: No apparent distress alert and oriented x3 mood and affect normal, dressed appropriately.  HEENT: Pupils  equal, extraocular movements intact  Respiratory: Patient's speak in full sentences and does not appear short of breath  Cardiovascular: No lower extremity edema, non tender, no erythema  Skin: Warm dry intact with no signs of infection or rash on extremities or on axial skeleton.  Abdomen: Soft nontender  Neuro: Cranial nerves II through XII are intact, neurovascularly intact in all extremities with 2+ DTRs and 2+ pulses.  Lymph: No lymphadenopathy of posterior or anterior cervical chain or axillae bilaterally.  Gait normal with good balance and coordination.  MSK:  Non tender with full range of motion and good stability and symmetric strength and tone of shoulders,  wrist, hip, knee and ankles bilaterally.  Elbow: Right Unremarkable to inspection. Range of motion full pronation, supination, flexion,  extension. Strength is full to all of the above directions Stable to varus, valgus stress. Negative moving valgus stress test. Tender to palpation around the elbow noted. Patient does have pain more on the antecubital fossa especially at the insertion avoid bicep tendinitis distally. Also some mild pain over the lateral epicondylar region. Ulnar nerve does not sublux. Negative cubital tunnel Tinel's. Contralateral elbow unremarkable  Musculoskeletal ultrasound was performed and interpreted by Charlann Boxer D.O.   Elbow:  Lateral epicondyle and common extensor tendon origin visualized. Mild increase in Doppler flow and hypoechoic changes at the insertion Radial head unremarkable and located in annular ligament patient though at the bicep 54-year-old does show that patient has tearing and is partial of the bicep tendon. Hypoechoic changes and increasing Doppler flow noted Medial epicondyle and common flexor tendon origin visualized.  No edema, effusions, or avulsions seen. Ulnar nerve in cubital tunnel unremarkable. Olecranon and triceps insertion visualized and unremarkable without edema, effusion, or avulsion.  No signs olecranon bursitis.   IMPRESSION: Partial tear noted bicep tendon distally. Patient also has mild lateral epicondylitis    Impression and Recommendations:     This case required medical decision making of moderate complexity.      Note: This dictation was prepared with Dragon dictation along with smaller phrase technology. Any transcriptional errors that result from this process are unintentional.

## 2017-07-26 ENCOUNTER — Ambulatory Visit (INDEPENDENT_AMBULATORY_CARE_PROVIDER_SITE_OTHER): Payer: Medicare Other | Admitting: Internal Medicine

## 2017-07-26 ENCOUNTER — Encounter: Payer: Self-pay | Admitting: Internal Medicine

## 2017-07-26 ENCOUNTER — Telehealth: Payer: Self-pay | Admitting: Internal Medicine

## 2017-07-26 VITALS — BP 118/84 | HR 76 | Temp 98.5°F | Ht 65.0 in | Wt 137.0 lb

## 2017-07-26 DIAGNOSIS — J029 Acute pharyngitis, unspecified: Secondary | ICD-10-CM | POA: Insufficient documentation

## 2017-07-26 DIAGNOSIS — R739 Hyperglycemia, unspecified: Secondary | ICD-10-CM

## 2017-07-26 DIAGNOSIS — F32A Depression, unspecified: Secondary | ICD-10-CM

## 2017-07-26 DIAGNOSIS — F329 Major depressive disorder, single episode, unspecified: Secondary | ICD-10-CM | POA: Diagnosis not present

## 2017-07-26 MED ORDER — LIDOCAINE VISCOUS 2 % MT SOLN: 20.0000 mL | OROMUCOSAL | 0 refills | Status: DC | PRN

## 2017-07-26 MED ORDER — METHYLPREDNISOLONE ACETATE 80 MG/ML IJ SUSP
80.0000 mg | Freq: Once | INTRAMUSCULAR | Status: AC
  Administered 2017-07-26: 80 mg via INTRAMUSCULAR

## 2017-07-26 MED ORDER — AZITHROMYCIN 250 MG PO TABS: ORAL_TABLET | ORAL | 1 refills | Status: DC

## 2017-07-26 NOTE — Progress Notes (Signed)
Subjective:    Patient ID: [name], male    DOB: Feb 10, 1941, 76 y.o.   MRN: [id number]  HPI  Here with sudden onset severe ST after spending several hours spray washing a bleach containing homemade solution earlier today; began to hurt immediately after stopping washing and started to eat his Big Mac the wife brought him for dinner.  Denies fever, HA, cough, and Pt denies chest pain, increased sob or doe, wheezing, orthopnea, PND, increased LE swelling, palpitations, dizziness or syncope.  Pt denies new neurological symptoms such as new headache, or facial or extremity weakness or numbness   Pt denies polydipsia, polyuria Denies worsening depressive symptoms, suicidal ideation, or panic Past Medical History:  Diagnosis Date  . Anxiety   . Atrial fibrillation ([place])    post MI-amiodarone stopped 07/2008  . Benign prostatic hypertrophy   . CAD (coronary artery disease) 07/2008   2 BMS placed for MI, August, 2009  . Carotid artery disease without cerebral infarction ([place])   . CORONARY ARTERY BYPASS GRAFT, HX OF 07/13/2009   August, 2009, Dr. Cyndia Bent  . Depression   . Ejection fraction    .  Marland Kitchen History of nephrolithiasis   . Hx of CABG   . Hx of colonoscopy   . Hyperlipidemia   . HYPERLIPIDEMIA 02/09/2009   Qualifier: Diagnosis of  By: Jenny Reichmann MD, Hunt Oris   . Increased prostate specific antigen (PSA) velocity   . NEPHROLITHIASIS, HX OF 02/09/2009   Qualifier: Diagnosis of  By: Jenny Reichmann MD, Hunt Oris   . Prostatitis   . PSA elevation 09/06/2012  . Thrombocytopenia ([place])    Past Surgical History:  Procedure Laterality Date  . CORONARY ARTERY BYPASS GRAFT  08/05/08   x5, LIMA to the LAD.  Marland Kitchen CORONARY STENT PLACEMENT  07/2008   2 BMS to RCA  . HEMORRHOID SURGERY    . HYDROCELE EXCISION    . left leg broken    . PROSTATE BIOPSY  2007   neg  . ROTATOR CUFF REPAIR     bilateral; Dr. Noemi Chapel    reports that he has never smoked. He has never used smokeless tobacco. He reports that he does not drink  alcohol or use drugs. family history includes Healthy in his brother; Leukemia in his father; Lung cancer in his mother. Allergies  Allergen Reactions  . Niacin     REACTION: leg ache and itching   Current Outpatient Prescriptions on File Prior to Visit  Medication Sig Dispense Refill  . aspirin 81 MG EC tablet Take 1 tablet (81 mg total) by mouth daily. Swallow whole. 30 tablet 12  . Multiple Vitamin (MULTIVITAMIN) tablet Take 1 tablet by mouth daily.    . Naproxen Sodium (ALEVE PO) Take by mouth as needed. Wrist pain    . simvastatin (ZOCOR) 40 MG tablet TAKE 1/2 (ONE-HALF) TABLET BY MOUTH AT BEDTIME 45 tablet 4   No current facility-administered medications on file prior to visit.    Review of Systems  Constitutional: Negative for other unusual diaphoresis or sweats HENT: Negative for ear discharge or swelling Eyes: Negative for other worsening visual disturbances Respiratory: Negative for stridor or other swelling  Gastrointestinal: Negative for worsening distension or other blood Genitourinary: Negative for retention or other urinary change Musculoskeletal: Negative for other MSK pain or swelling Skin: Negative for color change or other new lesions Neurological: Negative for worsening tremors and other numbness  Psychiatric/Behavioral: Negative for worsening agitation or other fatigue All other system neg  per pt    Objective:   Physical Exam BP 118/84   Pulse 76   Temp 98.5 F (36.9 C)   Ht _0  (1.651 m)   Wt 137 lb (62.1 kg)   SpO2 98%   BMI 22.80 kg/m  VS noted, nervous but  Constitutional: Pt appears in NAD HENT: Head: NCAT.  Right Ear: External ear normal.  Left Ear: External ear normal.  Eyes: . Pupils are equal, round, and reactive to light. Conjunctivae and EOM are normal Nose: without d/c or deformity Neck: Neck supple. Gross normal ROM, no neck LA or tenderness Bilat tm's with mild erythema.  Max sinus areas non tender.  Pharynx with severe erythema, no  exudate but mild swelling, Cardiovascular: Normal rate and regular rhythm.   Pulmonary/Chest: Effort normal and breath sounds without rales or wheezing.  Abd:  Soft, NT, ND, + BS, no organomegaly Neurological: Pt is alert. At baseline orientation, motor grossly intact Skin: Skin is warm. No rashes, other new lesions, no LE edema Psychiatric: Pt behavior is normal without agitation  No other exam findings    Assessment & Plan:

## 2017-07-26 NOTE — Telephone Encounter (Signed)
Patient Name: ALEKXANDER ISOLA  DOB: 1941/03/22    Initial Comment Caller states husband was working with bleach outside, spraying and now has a sore throat. Appt today is at 5:30 PM.    Nurse Assessment  Nurse: Christel Mormon, RN, Levada Dy Date/Time (Eastern Time): 07/26/2017 3:52:31 PM  Confirm and document reason for call. If symptomatic, describe symptoms. ---Caller states husband was pressure washing a wall after spraying bleach/water solution. He was outside for about 3 hrs. When she took him lunch, he had a hard time swallowing the hamburger she took him. He took a rapid melt Zycam and Cold_ez lozenge and that did not help. He also tried drinking a little milk.  Does the patient have any new or worsening symptoms? ---Yes  Will a triage be completed? ---Yes  Related visit to physician within the last 2 weeks? ---No  Does the PT have any chronic conditions? (i.e. diabetes, asthma, etc.) ---Yes  List chronic conditions. ---heart attack 7-8 yrs ago, high cholesterol  Is this a behavioral health or substance abuse call? ---No     Guidelines    Guideline Title Affirmed Question Affirmed Notes  Smoke and Fume Inhalation Harmless strong-smelling fumes (e.g., perfumes, household cleaners)    Final Disposition User   [place], RN, Levada Dy    Disagree/Comply: Comply

## 2017-07-26 NOTE — Patient Instructions (Signed)
You had the steroid shot today  Please take all new medication as prescribed - the Viscous Lidocaine as needed for pain, as well as the antibiotic if you have worsening pain or fever  Please continue all other medications as before, and refills have been done if requested.  Please have the pharmacy call with any other refills you may need.  Please keep your appointments with your specialists as you may have planned

## 2017-07-26 NOTE — Assessment & Plan Note (Signed)
stable overall by history and exam, and pt to continue medical treatment as before,  to f/u any worsening symptoms or concerns 

## 2017-07-26 NOTE — Assessment & Plan Note (Signed)
Moderate, sudden onset after bleach solution exposure for several hours, for depomedrol IM 80, viscous lidocaine prn, and zpack asd,  to f/u any worsening symptoms or concerns

## 2017-07-26 NOTE — Assessment & Plan Note (Signed)
stable overall by history and exam, recent data reviewed with pt, and pt to continue medical treatment as before,  to f/u any worsening symptoms or concerns Lab Results  Component Value Date   HGBA1C 6.2 04/18/2017

## 2017-08-09 ENCOUNTER — Encounter: Payer: Self-pay | Admitting: Family Medicine

## 2017-08-09 ENCOUNTER — Ambulatory Visit (INDEPENDENT_AMBULATORY_CARE_PROVIDER_SITE_OTHER): Payer: Medicare Other | Admitting: Family Medicine

## 2017-08-09 ENCOUNTER — Ambulatory Visit: Payer: Self-pay

## 2017-08-09 VITALS — BP 110/64 | HR 62 | Ht 63.0 in | Wt 136.0 lb

## 2017-08-09 DIAGNOSIS — M66821 Spontaneous rupture of other tendons, right upper arm: Secondary | ICD-10-CM

## 2017-08-09 DIAGNOSIS — M25521 Pain in right elbow: Secondary | ICD-10-CM

## 2017-08-09 DIAGNOSIS — S46111A Strain of muscle, fascia and tendon of long head of biceps, right arm, initial encounter: Secondary | ICD-10-CM

## 2017-08-09 NOTE — Patient Instructions (Signed)
Good to see you  Ice is your friend still especially after playing OK to let the big dog out but be careful and work on technique.  Exercises 3 times a week.  pennsaid pinkie amount topically 2 times daily as needed.   See me again in 4 weeks if not perfect

## 2017-08-09 NOTE — Progress Notes (Signed)
[name] [address] [place], [place] [address] Phone: [phone] Subjective:    I'm seeing this patient by the request  of:  Biagio Borg, MD   CC: right elbow pain f/u   [id number]  [name] is a 76 y.o. male coming in with complaint of right elbow pain.  Patient was found to have more of an distal partial right bicep tendon tear. Patient had been doing the exercises and avoiding golf. Does state that he is 60-75% better. Patient is not having any pain with regular daily activities at this point. Patient though is concerned that when he starts golfing and he is going to have the discomfort. Patient has been doing the exercises regularly and does feel like it has been helpful. Is taking the vitamin supplementations.     Past Medical History:  Diagnosis Date  . Anxiety   . Atrial fibrillation ([place])    post MI-amiodarone stopped 07/2008  . Benign prostatic hypertrophy   . CAD (coronary artery disease) 07/2008   2 BMS placed for MI, August, 2009  . Carotid artery disease without cerebral infarction ([place])   . CORONARY ARTERY BYPASS GRAFT, HX OF 07/13/2009   August, 2009, Dr. Cyndia Bent  . Depression   . Ejection fraction    .  Marland Kitchen History of nephrolithiasis   . Hx of CABG   . Hx of colonoscopy   . Hyperlipidemia   . HYPERLIPIDEMIA 02/09/2009   Qualifier: Diagnosis of  By: Jenny Reichmann MD, Hunt Oris   . Increased prostate specific antigen (PSA) velocity   . NEPHROLITHIASIS, HX OF 02/09/2009   Qualifier: Diagnosis of  By: Jenny Reichmann MD, Hunt Oris   . Prostatitis   . PSA elevation 09/06/2012  . Thrombocytopenia ([place])    Past Surgical History:  Procedure Laterality Date  . CORONARY ARTERY BYPASS GRAFT  08/05/08   x5, LIMA to the LAD.  Marland Kitchen CORONARY STENT PLACEMENT  07/2008   2 BMS to RCA  . HEMORRHOID SURGERY    . HYDROCELE EXCISION    . left leg broken    . PROSTATE BIOPSY  2007   neg  . ROTATOR CUFF REPAIR     bilateral; Dr. Noemi Chapel   Social History    Social History  . Marital status: Married    Spouse name: N/A  . Number of children: 1  . Years of education: N/A   Social History Main Topics  . Smoking status: Never Smoker  . Smokeless tobacco: Never Used  . Alcohol use No  . Drug use: No  . Sexual activity: Not Asked   Other Topics Concern  . None   Social History Narrative  . None   Allergies  Allergen Reactions  . Niacin     REACTION: leg ache and itching   Family History  Problem Relation Age of Onset  . Lung cancer Mother   . Leukemia Father   . Healthy Brother   . Colon cancer Neg Hx   . Rectal cancer Neg Hx   . Stomach cancer Neg Hx      Past medical history, social, surgical and family history all reviewed in electronic medical record.  No pertanent information unless stated regarding to the chief complaint.   Review of Systems:Review of systems updated and as accurate as of 08/09/17  No headache, visual changes, nausea, vomiting, diarrhea, constipation, dizziness, abdominal pain, skin rash, fevers, chills, night sweats, weight loss, swollen lymph nodes, body aches, joint swelling,  muscle aches, chest pain, shortness of breath, mood changes.   Objective  Blood pressure 110/64, pulse 62, height 5\' 3"  (1.6 m), weight 136 lb (61.7 kg). Systems examined below as of 08/09/17   General: No apparent distress alert and oriented x3 mood and affect normal, dressed appropriately.  HEENT: Pupils equal, extraocular movements intact  Respiratory: Patient's speak in full sentences and does not appear short of breath  Cardiovascular: No lower extremity edema, non tender, no erythema  Skin: Warm dry intact with no signs of infection or rash on extremities or on axial skeleton.  Abdomen: Soft nontender  Neuro: Cranial nerves II through XII are intact, neurovascularly intact in all extremities with 2+ DTRs and 2+ pulses.  Lymph: No lymphadenopathy of posterior or anterior cervical chain or axillae bilaterally.  Gait  normal with good balance and coordination.  MSK:  Non tender with full range of motion and good stability and symmetric strength and tone of shoulders,  wrist, hip, knee and ankles bilaterally.  Elbow:Right Unremarkable to inspection. Range of motion full pronation, supination, flexion, extension. Strength is full to all of the above directions Stable to varus, valgus stress. Negative moving valgus stress test. Mild pain over the lateral epicondylar region. Ulnar nerve does not sublux. Negative cubital tunnel Tinel's. Collateral elbow unremarkable  Musculoskeletal ultrasound was performed and interpreted by Charlann Boxer D.O.   Elbow:  Lateral epicondyle and common extensor tendon origin visualized.  Patient does have more of a bicep tendon tear still noted but does have good scar tissue formation. Mild increase vascularity  IMPRESSION: Healing of the partial tendon bicep noted. Lateral epicondylitis that show improvement as well.    Impression and Recommendations:     This case required medical decision making of moderate complexity.      Note: This dictation was prepared with Dragon dictation along with smaller phrase technology. Any transcriptional errors that result from this process are unintentional.

## 2017-08-09 NOTE — Assessment & Plan Note (Signed)
Discussed with patient about continuing the possibility for compression. We discussed icing regimen, home exercises in which activities to do in which ones to avoid. Patient will increase activity as tolerated. Follow-up again with me in 4 weeks

## 2017-10-04 ENCOUNTER — Ambulatory Visit (INDEPENDENT_AMBULATORY_CARE_PROVIDER_SITE_OTHER): Payer: Medicare Other | Admitting: General Practice

## 2017-10-04 DIAGNOSIS — Z23 Encounter for immunization: Secondary | ICD-10-CM

## 2017-10-11 ENCOUNTER — Encounter: Payer: Self-pay | Admitting: Gastroenterology

## 2017-11-15 DIAGNOSIS — Z961 Presence of intraocular lens: Secondary | ICD-10-CM | POA: Diagnosis not present

## 2017-11-15 DIAGNOSIS — H2511 Age-related nuclear cataract, right eye: Secondary | ICD-10-CM | POA: Diagnosis not present

## 2017-12-20 ENCOUNTER — Ambulatory Visit (INDEPENDENT_AMBULATORY_CARE_PROVIDER_SITE_OTHER): Payer: Medicare Other | Admitting: Cardiovascular Disease

## 2017-12-20 ENCOUNTER — Encounter: Payer: Self-pay | Admitting: Cardiovascular Disease

## 2017-12-20 VITALS — BP 128/72 | HR 56 | Ht 63.0 in | Wt 139.0 lb

## 2017-12-20 DIAGNOSIS — E782 Mixed hyperlipidemia: Secondary | ICD-10-CM

## 2017-12-20 DIAGNOSIS — I2510 Atherosclerotic heart disease of native coronary artery without angina pectoris: Secondary | ICD-10-CM | POA: Diagnosis not present

## 2017-12-20 NOTE — Progress Notes (Signed)
Cardiology Office Note   Date:  12/20/2017   ID:  [name], [name] 06-04-1941, MRN [id number]  PCP:  Biagio Borg, MD  Cardiologist:   Mertie Moores, MD   Transfer from Dr. Ron Parker   Chief Complaint  Patient presents with  . Coronary Artery Disease   Problem list 1. Coronary artery disease - MI - had emergent BMS 2009 followed by emergent CABG 2. Hyperlipidemia  3. Essential hypertension 4. History of atrial fibrillation     [name] is a 77 y.o. male who presents for followed up of CAD No CP, Plays golf regularly , works out regularly Retired from [employer] .   Nov. 29, 2017:  Doing well. Exercising regularly.   Playing golf regularly .  Played the course at [facility])   December 20, 2017:  no cardiac issues. Has been eating more sweets over the Christmas holidays .  Has gained a little weight.   Has not been exercising much .  Not playing as much golf recently   Past Medical History:  Diagnosis Date  . Anxiety   . Atrial fibrillation ([place])    post MI-amiodarone stopped 07/2008  . Benign prostatic hypertrophy   . CAD (coronary artery disease) 07/2008   2 BMS placed for MI, August, 2009  . Carotid artery disease without cerebral infarction ([place])   . CORONARY ARTERY BYPASS GRAFT, HX OF 07/13/2009   August, 2009, Dr. Cyndia Bent  . Depression   . Ejection fraction    .  Marland Kitchen History of nephrolithiasis   . Hx of CABG   . Hx of colonoscopy   . Hyperlipidemia   . HYPERLIPIDEMIA 02/09/2009   Qualifier: Diagnosis of  By: Jenny Reichmann MD, Hunt Oris   . Increased prostate specific antigen (PSA) velocity   . NEPHROLITHIASIS, HX OF 02/09/2009   Qualifier: Diagnosis of  By: Jenny Reichmann MD, Hunt Oris   . Prostatitis   . PSA elevation 09/06/2012  . Thrombocytopenia ([place])     Past Surgical History:  Procedure Laterality Date  . CORONARY ARTERY BYPASS GRAFT  08/05/08   x5, LIMA to the LAD.  Marland Kitchen CORONARY STENT PLACEMENT  07/2008   2 BMS to RCA  . HEMORRHOID SURGERY      . HYDROCELE EXCISION    . left leg broken    . PROSTATE BIOPSY  2007   neg  . ROTATOR CUFF REPAIR     bilateral; Dr. Noemi Chapel     Current Outpatient Medications  Medication Sig Dispense Refill  . aspirin 81 MG EC tablet Take 1 tablet (81 mg total) by mouth daily. Swallow whole. 30 tablet 12  . azithromycin (ZITHROMAX Z-PAK) 250 MG tablet 2 tab by mouth day 1, then 1 per day 6 tablet 1  . Multiple Vitamin (MULTIVITAMIN) tablet Take 1 tablet by mouth daily.    . Naproxen Sodium (ALEVE PO) Take by mouth as needed. Wrist pain    . simvastatin (ZOCOR) 40 MG tablet TAKE 1/2 (ONE-HALF) TABLET BY MOUTH AT BEDTIME 45 tablet 4   No current facility-administered medications for this visit.     Allergies:   Niacin    Social History:  The patient  reports that  has never smoked. he has never used smokeless tobacco. He reports that he does not drink alcohol or use drugs.   Family History:  The patient's family history includes Healthy in his brother; Leukemia in his father; Lung cancer in his mother.  ROS:   Listed in  Current hx , otherwise negative   Physical Exam: Blood pressure 128/72, pulse (!) 56, height 5\' 3"  (1.6 m), weight 139 lb (63 kg), SpO2 97 %.  GEN:  Well nourished, well developed in no acute distress HEENT: Normal NECK: No JVD; No carotid bruits LYMPHATICS: No lymphadenopathy CARDIAC: RR, normal S1S2  RESPIRATORY:  Clear to auscultation without rales, wheezing or rhonchi  ABDOMEN: Soft, non-tender, non-distended MUSCULOSKELETAL:  No edema; No deformity  SKIN: Warm and dry NEUROLOGIC:  Alert and oriented x 3   EKG:    Sinus brady at 57.  Otherwise normal ECG  Recent Labs: 04/18/2017: ALT 15; BUN 15; Creatinine, Ser 1.23; Hemoglobin 15.6; Platelets 147.0; Potassium 4.8; Sodium 140; TSH 2.29    Lipid Panel    Component Value Date/Time   CHOL 138 04/18/2017 0929   TRIG 114.0 04/18/2017 0929   HDL 55.70 04/18/2017 0929   CHOLHDL 2 04/18/2017 0929   VLDL 22.8  04/18/2017 0929   LDLCALC 60 04/18/2017 0929      Wt Readings from Last 3 Encounters:  12/20/17 139 lb (63 kg)  08/09/17 136 lb (61.7 kg)  07/26/17 137 lb (62.1 kg)      Other studies Reviewed: Additional studies/ records that were reviewed today include: . Review of the above records demonstrates:    ASSESSMENT AND PLAN:  1. Coronary artery disease -   no angina ,   Doing well.  Exercising regularly   2. Hyperlipidemia   - continue smiva.  Lipids look good  3. Essential hypertension -   BP is well controlled.   4. History of atrial fibrillation Maintaining NSR   Current medicines are reviewed at length with the patient today.  The patient does not have concerns regarding medicines.  The following changes have been made:  no change  Labs/ tests ordered today include:  No orders of the defined types were placed in this encounter.    Disposition:   FU with me in 1 year      Mertie Moores, MD  12/20/2017 8:51 AM    [facility] [place], [place], [place]  [address] Phone: [phone]; Fax: [phone]

## 2017-12-20 NOTE — Patient Instructions (Signed)
Medication Instructions:  Your physician recommends that you continue on your current medications as directed. Please refer to the Current Medication list given to you today.   Labwork: None Ordered   Testing/Procedures: None Ordered   Follow-Up: Your physician wants you to follow-up in: 1 year with Dr. Nahser.  You will receive a reminder letter in the mail two months in advance. If you don't receive a letter, please call our office to schedule the follow-up appointment.   If you need a refill on your cardiac medications before your next appointment, please call your pharmacy.   Thank you for choosing [facility]! Leshonda Galambos, RN [phone]    

## 2018-02-09 DIAGNOSIS — J111 Influenza due to unidentified influenza virus with other respiratory manifestations: Secondary | ICD-10-CM | POA: Diagnosis not present

## 2018-02-09 DIAGNOSIS — R509 Fever, unspecified: Secondary | ICD-10-CM | POA: Diagnosis not present

## 2018-02-18 DIAGNOSIS — R972 Elevated prostate specific antigen [PSA]: Secondary | ICD-10-CM | POA: Diagnosis not present

## 2018-02-18 DIAGNOSIS — N5201 Erectile dysfunction due to arterial insufficiency: Secondary | ICD-10-CM | POA: Diagnosis not present

## 2018-02-18 DIAGNOSIS — Z87442 Personal history of urinary calculi: Secondary | ICD-10-CM | POA: Diagnosis not present

## 2018-03-14 ENCOUNTER — Ambulatory Visit: Payer: Self-pay

## 2018-03-14 ENCOUNTER — Encounter: Payer: Self-pay | Admitting: Family Medicine

## 2018-03-14 ENCOUNTER — Ambulatory Visit (INDEPENDENT_AMBULATORY_CARE_PROVIDER_SITE_OTHER): Payer: Medicare Other | Admitting: Family Medicine

## 2018-03-14 VITALS — BP 118/72 | HR 57 | Ht 63.0 in | Wt 138.0 lb

## 2018-03-14 DIAGNOSIS — M25529 Pain in unspecified elbow: Secondary | ICD-10-CM | POA: Diagnosis not present

## 2018-03-14 DIAGNOSIS — M7711 Lateral epicondylitis, right elbow: Secondary | ICD-10-CM

## 2018-03-14 DIAGNOSIS — I2510 Atherosclerotic heart disease of native coronary artery without angina pectoris: Secondary | ICD-10-CM

## 2018-03-14 MED ORDER — VITAMIN D (ERGOCALCIFEROL) 1.25 MG (50000 UNIT) PO CAPS: 50000.0000 [IU] | ORAL_CAPSULE | ORAL | 0 refills | Status: DC

## 2018-03-14 NOTE — Progress Notes (Signed)
[name] [place],  [id number] Phone: [phone] Subjective:    I'm seeing this patient by the request  of:    CC: Right arm pain follow-up  [id number]  [name] is a 77 y.o. male coming in with complaint of right arm pain.  Found to have more of a bicep tendinitis as well as a lateral epicondylitis.  States that the bicep pain is significantly better but is having worsening lateral epicondylar pain.  Patient is an avid Air cabin crew.  Finding it difficult to make it through around without pain.  Rates the severity of pain is 7 out of 10.  Denies any weakness though.     Past Medical History:  Diagnosis Date  . Anxiety   . Atrial fibrillation ([place])    post MI-amiodarone stopped 07/2008  . Benign prostatic hypertrophy   . CAD (coronary artery disease) 07/2008   2 BMS placed for MI, August, 2009  . Carotid artery disease without cerebral infarction ([place])   . CORONARY ARTERY BYPASS GRAFT, HX OF 07/13/2009   August, 2009, Dr. Cyndia Bent  . Depression   . Ejection fraction    .  Marland Kitchen History of nephrolithiasis   . Hx of CABG   . Hx of colonoscopy   . Hyperlipidemia   . HYPERLIPIDEMIA 02/09/2009   Qualifier: Diagnosis of  By: Jenny Reichmann MD, Hunt Oris   . Increased prostate specific antigen (PSA) velocity   . NEPHROLITHIASIS, HX OF 02/09/2009   Qualifier: Diagnosis of  By: Jenny Reichmann MD, Hunt Oris   . Prostatitis   . PSA elevation 09/06/2012  . Thrombocytopenia ([place])    Past Surgical History:  Procedure Laterality Date  . CORONARY ARTERY BYPASS GRAFT  08/05/08   x5, LIMA to the LAD.  Marland Kitchen CORONARY STENT PLACEMENT  07/2008   2 BMS to RCA  . HEMORRHOID SURGERY    . HYDROCELE EXCISION    . left leg broken    . PROSTATE BIOPSY  2007   neg  . ROTATOR CUFF REPAIR     bilateral; Dr. Noemi Chapel   Social History   Socioeconomic History  . Marital status: Married    Spouse name: Not on file  . Number of children: 1  . Years of education: Not on file  .  Highest education level: Not on file  Occupational History  . Not on file  Social Needs  . Financial resource strain: Not on file  . Food insecurity:    Worry: Not on file    Inability: Not on file  . Transportation needs:    Medical: Not on file    Non-medical: Not on file  Tobacco Use  . Smoking status: Never Smoker  . Smokeless tobacco: Never Used  Substance and Sexual Activity  . Alcohol use: No  . Drug use: No  . Sexual activity: Not on file  Lifestyle  . Physical activity:    Days per week: Not on file    Minutes per session: Not on file  . Stress: Not on file  Relationships  . Social connections:    Talks on phone: Not on file    Gets together: Not on file    Attends religious service: Not on file    Active member of club or organization: Not on file    Attends meetings of clubs or organizations: Not on file    Relationship status: Not on file  Other Topics Concern  . Not on file  Social History Narrative  . Not on file   Allergies  Allergen Reactions  . Niacin     REACTION: leg ache and itching   Family History  Problem Relation Age of Onset  . Lung cancer Mother   . Leukemia Father   . Healthy Brother   . Colon cancer Neg Hx   . Rectal cancer Neg Hx   . Stomach cancer Neg Hx      Past medical history, social, surgical and family history all reviewed in electronic medical record.  No pertanent information unless stated regarding to the chief complaint.   Review of Systems:Review of systems updated and as accurate as of 03/14/18  No headache, visual changes, nausea, vomiting, diarrhea, constipation, dizziness, abdominal pain, skin rash, fevers, chills, night sweats, weight loss, swollen lymph nodes, body aches, joint swelling, muscle aches, chest pain, shortness of breath, mood changes.   Objective  Blood pressure 118/72, pulse (!) 57, height 5\' 3"  (1.6 m), weight 138 lb (62.6 kg), SpO2 95 %. Systems examined below as of 03/14/18   General: No  apparent distress alert and oriented x3 mood and affect normal, dressed appropriately.  HEENT: Pupils equal, extraocular movements intact  Respiratory: Patient's speak in full sentences and does not appear short of breath  Cardiovascular: No lower extremity edema, non tender, no erythema  Skin: Warm dry intact with no signs of infection or rash on extremities or on axial skeleton.  Abdomen: Soft nontender  Neuro: Cranial nerves II through XII are intact, neurovascularly intact in all extremities with 2+ DTRs and 2+ pulses.  Lymph: No lymphadenopathy of posterior or anterior cervical chain or axillae bilaterally.  Gait normal with good balance and coordination.  MSK:  Non tender with full range of motion and good stability and symmetric strength and tone of shoulders,  wrist, hip, knee and ankles bilaterally.  Elbow: Right Unremarkable to inspection. Range of motion full pronation, supination, flexion, extension. Strength is full to all of the above directions Stable to varus, valgus stress. Negative moving valgus stress test. Pain over the lateral epicondylar region Ulnar nerve does not sublux. Negative cubital tunnel Tinel's. Contralateral elbow unremarkable  Musculoskeletal ultrasound was performed and interpreted by Charlann Boxer D.O.   Elbow: Right Lateral epicondyle and common extensor tendon visualized with significant increase in Doppler flow but no true tear appreciated.  No signs of any avulsion.  Mild hypoechoic changes surrounding the area. Radial head unremarkable and located in annular ligament Medial epicondyle and common flexor tendon origin visualized.  No edema, effusions, or avulsions seen. Ulnar nerve in cubital tunnel unremarkable.  IMPRESSION: Worsening lateral epicondylitis    Impression and Recommendations:     This case required medical decision making of moderate complexity.      Note: This dictation was prepared with Dragon dictation along with smaller  phrase technology. Any transcriptional errors that result from this process are unintentional.

## 2018-03-14 NOTE — Patient Instructions (Signed)
Good to see you  Ice is your friend Ice 20 minutes 2 times daily. Usually after activity and before bed. Get the brace and wear it with golf and other repetitive activity  Once weekly vitamin D for 12 weeks See me again in 3-4 weeks

## 2018-03-14 NOTE — Assessment & Plan Note (Signed)
Worsening symptoms.  Patient was noncompliant.  We discussed icing regimen and home exercises which patient is encouraged to do on a more regular basis.  We discussed which activities to avoid.  Patient is in agreement with plan.  Will follow up with me again 4-6 weeks.  Worsening symptoms we will consider physical therapy or injections.

## 2018-04-23 ENCOUNTER — Ambulatory Visit (INDEPENDENT_AMBULATORY_CARE_PROVIDER_SITE_OTHER): Payer: Medicare Other | Admitting: Internal Medicine

## 2018-04-23 ENCOUNTER — Encounter: Payer: Self-pay | Admitting: Internal Medicine

## 2018-04-23 ENCOUNTER — Other Ambulatory Visit (INDEPENDENT_AMBULATORY_CARE_PROVIDER_SITE_OTHER): Payer: Medicare Other

## 2018-04-23 VITALS — BP 124/78 | HR 68 | Temp 97.7°F | Ht 63.0 in | Wt 136.0 lb

## 2018-04-23 DIAGNOSIS — I2510 Atherosclerotic heart disease of native coronary artery without angina pectoris: Secondary | ICD-10-CM | POA: Diagnosis not present

## 2018-04-23 DIAGNOSIS — R739 Hyperglycemia, unspecified: Secondary | ICD-10-CM

## 2018-04-23 DIAGNOSIS — R972 Elevated prostate specific antigen [PSA]: Secondary | ICD-10-CM

## 2018-04-23 DIAGNOSIS — F411 Generalized anxiety disorder: Secondary | ICD-10-CM | POA: Diagnosis not present

## 2018-04-23 DIAGNOSIS — E785 Hyperlipidemia, unspecified: Secondary | ICD-10-CM | POA: Diagnosis not present

## 2018-04-23 LAB — URINALYSIS, ROUTINE W REFLEX MICROSCOPIC
BILIRUBIN URINE: NEGATIVE
HGB URINE DIPSTICK: NEGATIVE
Ketones, ur: NEGATIVE
LEUKOCYTES UA: NEGATIVE
NITRITE: NEGATIVE
RBC / HPF: NONE SEEN (ref 0–?)
Specific Gravity, Urine: 1.015 (ref 1.000–1.030)
Total Protein, Urine: NEGATIVE
Urine Glucose: NEGATIVE
Urobilinogen, UA: 0.2 (ref 0.0–1.0)
WBC, UA: NONE SEEN (ref 0–?)
pH: 5.5 (ref 5.0–8.0)

## 2018-04-23 LAB — LIPID PANEL
CHOL/HDL RATIO: 3
Cholesterol: 138 mg/dL (ref 0–200)
HDL: 43.9 mg/dL (ref >39.00)
LDL Cholesterol: 63 mg/dL (ref 0–99)
NonHDL: 94.5
TRIGLYCERIDES: 160 mg/dL — AB (ref 0.0–149.0)
VLDL: 32 mg/dL (ref 0.0–40.0)

## 2018-04-23 LAB — CBC WITH DIFFERENTIAL/PLATELET
BASOS PCT: 0.3 % (ref 0.0–3.0)
Basophils Absolute: 0 10*3/uL (ref 0.0–0.1)
EOS PCT: 2 % (ref 0.0–5.0)
Eosinophils Absolute: 0.1 10*3/uL (ref 0.0–0.7)
HCT: 43.4 % (ref 39.0–52.0)
Hemoglobin: 15.1 g/dL (ref 13.0–17.0)
LYMPHS ABS: 2.3 10*3/uL (ref 0.7–4.0)
Lymphocytes Relative: 36.2 % (ref 12.0–46.0)
MCHC: 34.9 g/dL (ref 30.0–36.0)
MCV: 88.8 fl (ref 78.0–100.0)
MONOS PCT: 10.6 % (ref 3.0–12.0)
Monocytes Absolute: 0.7 10*3/uL (ref 0.1–1.0)
NEUTROS ABS: 3.2 10*3/uL (ref 1.4–7.7)
NEUTROS PCT: 50.9 % (ref 43.0–77.0)
Platelets: 142 10*3/uL — ABNORMAL LOW (ref 150.0–400.0)
RBC: 4.89 Mil/uL (ref 4.22–5.81)
RDW: 12.9 % (ref 11.5–15.5)
WBC: 6.3 10*3/uL (ref 4.0–10.5)

## 2018-04-23 LAB — BASIC METABOLIC PANEL
BUN: 14 mg/dL (ref 6–23)
CALCIUM: 9.3 mg/dL (ref 8.4–10.5)
CO2: 30 meq/L (ref 19–32)
Chloride: 102 mEq/L (ref 96–112)
Creatinine, Ser: 1.13 mg/dL (ref 0.40–1.50)
GFR: 67.03 mL/min (ref >60.00)
Glucose, Bld: 107 mg/dL — ABNORMAL HIGH (ref 70–99)
Potassium: 4.3 mEq/L (ref 3.5–5.1)
SODIUM: 139 meq/L (ref 135–145)

## 2018-04-23 LAB — HEPATIC FUNCTION PANEL
ALBUMIN: 4.2 g/dL (ref 3.5–5.2)
ALT: 14 U/L (ref 0–53)
AST: 23 U/L (ref 0–37)
Alkaline Phosphatase: 40 U/L (ref 39–117)
Bilirubin, Direct: 0.1 mg/dL (ref 0.0–0.3)
Total Bilirubin: 0.8 mg/dL (ref 0.2–1.2)
Total Protein: 7 g/dL (ref 6.0–8.3)

## 2018-04-23 LAB — HEMOGLOBIN A1C: Hgb A1c MFr Bld: 6.1 % (ref 4.6–6.5)

## 2018-04-23 LAB — PSA: PSA: 7.45 ng/mL — ABNORMAL HIGH (ref 0.10–4.00)

## 2018-04-23 LAB — TSH: TSH: 4.28 u[IU]/mL (ref 0.35–4.50)

## 2018-04-23 MED ORDER — ZOSTER VAC RECOMB ADJUVANTED 50 MCG/0.5ML IM SUSR: 0.5000 mL | Freq: Once | INTRAMUSCULAR | 1 refills | Status: AC

## 2018-04-23 NOTE — Assessment & Plan Note (Signed)
Lab Results  Component Value Date   LDLCALC 63 04/23/2018  stable overall by history and exam, recent data reviewed with pt, and pt to continue medical treatment as before,  to f/u any worsening symptoms or concerns

## 2018-04-23 NOTE — Patient Instructions (Addendum)
Your shingles shot prescription was sent to the pharmacy  Please continue all other medications as before, and refills have been done if requested.  Please have the pharmacy call with any other refills you may need.  Please continue your efforts at being more active, low cholesterol diet, and weight control.  You are otherwise up to date with prevention measures today.  Please keep your appointments with your specialists as you may have planned  Please go to the LAB in the Basement (turn left off the elevator) for the tests to be done today  You will be contacted by phone if any changes need to be made immediately.  Otherwise, you will receive a letter about your results with an explanation, but please check with MyChart first.  Please remember to sign up for MyChart if you have not done so, as this will be important to you in the future with finding out test results, communicating by private email, and scheduling acute appointments online when needed.  Please return in 1 year for your yearly visit, or sooner if needed 

## 2018-04-23 NOTE — Assessment & Plan Note (Signed)
Chronic persistent, declines need for change in tx at this time

## 2018-04-23 NOTE — Progress Notes (Signed)
Subjective:    Patient ID: [name], male    DOB: 1941/10/08, 77 y.o.   MRN: [id number]  HPI Here for yearly f/u;  Overall doing ok;  Pt denies Chest pain, worsening SOB, DOE, wheezing, orthopnea, PND, worsening LE edema, palpitations, dizziness or syncope.  Pt denies neurological change such as new headache, facial or extremity weakness.  Pt denies polydipsia, polyuria, or low sugar symptoms. Pt states overall good compliance with treatment and medications, good tolerability, and has been trying to follow appropriate diet.  Pt denies worsening depressive symptoms, suicidal ideation or panic. No fever, night sweats, wt loss, loss of appetite, or other constitutional symptoms.  Pt states good ability with ADL's, has low fall risk, home safety reviewed and adequate, no other significant changes in hearing or vision, and only occasionally active with exercise.  Pt notes simvastatin from his online research can cause muscle issue and liver dz and requires counseling regarding this today.  No other interval hx or new complaints.  Denies urinary symptoms such as dysuria, frequency, urgency, flank pain, hematuria or n/v, fever, chills. Past Medical History:  Diagnosis Date  . Anxiety   . Atrial fibrillation ([place])    post MI-amiodarone stopped 07/2008  . Benign prostatic hypertrophy   . CAD (coronary artery disease) 07/2008   2 BMS placed for MI, August, 2009  . Carotid artery disease without cerebral infarction ([place])   . CORONARY ARTERY BYPASS GRAFT, HX OF 07/13/2009   August, 2009, Dr. Cyndia Bent  . Depression   . Ejection fraction    .  Marland Kitchen History of nephrolithiasis   . Hx of CABG   . Hx of colonoscopy   . Hyperlipidemia   . HYPERLIPIDEMIA 02/09/2009   Qualifier: Diagnosis of  By: Jenny Reichmann MD, Hunt Oris   . Increased prostate specific antigen (PSA) velocity   . NEPHROLITHIASIS, HX OF 02/09/2009   Qualifier: Diagnosis of  By: Jenny Reichmann MD, Hunt Oris   . Prostatitis   . PSA elevation 09/06/2012  .  Thrombocytopenia ([place])    Past Surgical History:  Procedure Laterality Date  . CORONARY ARTERY BYPASS GRAFT  08/05/08   x5, LIMA to the LAD.  Marland Kitchen CORONARY STENT PLACEMENT  07/2008   2 BMS to RCA  . HEMORRHOID SURGERY    . HYDROCELE EXCISION    . left leg broken    . PROSTATE BIOPSY  2007   neg  . ROTATOR CUFF REPAIR     bilateral; Dr. Noemi Chapel    reports that he has never smoked. He has never used smokeless tobacco. He reports that he does not drink alcohol or use drugs. family history includes Healthy in his brother; Leukemia in his father; Lung cancer in his mother. Allergies  Allergen Reactions  . Niacin     REACTION: leg ache and itching   Current Outpatient Medications on File Prior to Visit  Medication Sig Dispense Refill  . aspirin 81 MG EC tablet Take 1 tablet (81 mg total) by mouth daily. Swallow whole. 30 tablet 12  . Multiple Vitamin (MULTIVITAMIN) tablet Take 1 tablet by mouth daily.    . Naproxen Sodium (ALEVE PO) Take by mouth as needed. Wrist pain    . simvastatin (ZOCOR) 40 MG tablet TAKE 1/2 (ONE-HALF) TABLET BY MOUTH AT BEDTIME 45 tablet 4  . Vitamin D, Ergocalciferol, (DRISDOL) 50000 units CAPS capsule Take 1 capsule (50,000 Units total) by mouth every 7 (seven) days. 12 capsule 0   No current facility-administered medications on  file prior to visit.    Review of Systems  Constitutional: Negative for other unusual diaphoresis or sweats HENT: Negative for ear discharge or swelling Eyes: Negative for other worsening visual disturbances Respiratory: Negative for stridor or other swelling  Gastrointestinal: Negative for worsening distension or other blood Genitourinary: Negative for retention or other urinary change Musculoskeletal: Negative for other MSK pain or swelling Skin: Negative for color change or other new lesions Neurological: Negative for worsening tremors and other numbness  Psychiatric/Behavioral: Negative for worsening agitation or other  fatigue All other system neg per pt    Objective:   Physical Exam BP 124/78   Pulse 68   Temp 97.7 F (36.5 C) (Oral)   Ht 5\' 3"  (1.6 m)   Wt 136 lb (61.7 kg)   SpO2 98%   BMI 24.09 kg/m  VS noted, not ill appearing Constitutional: Pt is oriented to person, place, and time. Appears well-developed and well-nourished, in no significant distress and comfortable Head: Normocephalic and atraumatic  Eyes: Conjunctivae and EOM are normal. Pupils are equal, round, and reactive to light Right Ear: External ear normal without discharge Left Ear: External ear normal without discharge Nose: Nose without discharge or deformity Mouth/Throat: Oropharynx is without other ulcerations and moist  Neck: Normal range of motion. Neck supple. No JVD present. No tracheal deviation present or significant neck LA or mass Cardiovascular: Normal rate, regular rhythm, normal heart sounds and intact distal pulses.   Pulmonary/Chest: WOB normal and breath sounds without rales or wheezing  Abdominal: Soft. Bowel sounds are normal. NT. No HSM  Musculoskeletal: Normal range of motion. Exhibits no edema Lymphadenopathy: Has no other cervical adenopathy.  Neurological: Pt is alert and oriented to person, place, and time. Pt has normal reflexes. No cranial nerve deficit. Motor grossly intact, Gait intact Skin: Skin is warm and dry. No rash noted or new ulcerations Psychiatric:  Has nervousl mood and affect. Behavior is normal without agitation No other exam findings        Assessment & Plan:

## 2018-04-23 NOTE — Assessment & Plan Note (Signed)
Pt with long discourse about how he was told "in no certain terms" per cardiologist that he had prostate cancer although has seen urology in past for chronic elevated PSA; so he then f/u very soon after with urology which confirmed this and no plans for further biopsy at this point; pt plans to seek new cardiologist

## 2018-04-23 NOTE — Assessment & Plan Note (Signed)
stable overall by history and exam, recent data reviewed with pt, and pt to continue medical treatment as before,  to f/u any worsening symptoms or concerns  Lab Results  Component Value Date   HGBA1C 6.1 04/23/2018

## 2018-06-06 ENCOUNTER — Other Ambulatory Visit: Payer: Self-pay | Admitting: Family Medicine

## 2018-06-06 NOTE — Telephone Encounter (Signed)
Refill done.  

## 2018-06-08 ENCOUNTER — Other Ambulatory Visit: Payer: Self-pay | Admitting: Family Medicine

## 2018-07-25 ENCOUNTER — Other Ambulatory Visit: Payer: Self-pay | Admitting: Internal Medicine

## 2018-08-22 DIAGNOSIS — K5730 Diverticulosis of large intestine without perforation or abscess without bleeding: Secondary | ICD-10-CM | POA: Diagnosis not present

## 2018-08-22 DIAGNOSIS — Z86010 Personal history of colonic polyps: Secondary | ICD-10-CM | POA: Diagnosis not present

## 2018-08-22 DIAGNOSIS — Z1211 Encounter for screening for malignant neoplasm of colon: Secondary | ICD-10-CM | POA: Diagnosis not present

## 2018-08-27 ENCOUNTER — Other Ambulatory Visit: Payer: Self-pay | Admitting: Family Medicine

## 2018-08-27 NOTE — Telephone Encounter (Signed)
Refill done.  

## 2018-08-30 ENCOUNTER — Ambulatory Visit (INDEPENDENT_AMBULATORY_CARE_PROVIDER_SITE_OTHER): Payer: Medicare Other | Admitting: Internal Medicine

## 2018-08-30 ENCOUNTER — Encounter: Payer: Self-pay | Admitting: Internal Medicine

## 2018-08-30 VITALS — BP 116/72 | HR 71 | Temp 97.7°F | Ht 63.0 in | Wt 134.0 lb

## 2018-08-30 DIAGNOSIS — Z23 Encounter for immunization: Secondary | ICD-10-CM

## 2018-08-30 DIAGNOSIS — J019 Acute sinusitis, unspecified: Secondary | ICD-10-CM

## 2018-08-30 DIAGNOSIS — R972 Elevated prostate specific antigen [PSA]: Secondary | ICD-10-CM

## 2018-08-30 DIAGNOSIS — R739 Hyperglycemia, unspecified: Secondary | ICD-10-CM

## 2018-08-30 DIAGNOSIS — I2510 Atherosclerotic heart disease of native coronary artery without angina pectoris: Secondary | ICD-10-CM | POA: Diagnosis not present

## 2018-08-30 MED ORDER — LEVOFLOXACIN 500 MG PO TABS: 500.0000 mg | ORAL_TABLET | Freq: Every day | ORAL | 0 refills | Status: AC

## 2018-08-30 MED ORDER — CEFTRIAXONE SODIUM 1 G IJ SOLR
1.0000 g | Freq: Once | INTRAMUSCULAR | Status: AC
  Administered 2018-08-30: 1 g via INTRAMUSCULAR

## 2018-08-30 NOTE — Assessment & Plan Note (Signed)
stable overall by history and exam, recent data reviewed with pt, and pt to continue medical treatment as before,  to f/u any worsening symptoms or concerns  

## 2018-08-30 NOTE — Assessment & Plan Note (Addendum)
Moderate to severe with impressive right sided tender LA, doubt malignancy, for rocephin 1 gm IM now and oral antibx course,  to f/u any worsening symptoms or concerns

## 2018-08-30 NOTE — Patient Instructions (Signed)
You had the antibiotic shot today (rocephin)  Please take all new medication as prescribed - the pill antibiotic (levaquin)  Please call for ENT referral in 3-5 days if not improved  Please continue all other medications as before, and refills have been done if requested.  Please have the pharmacy call with any other refills you may need.  Please keep your appointments with your specialists as you may have planned

## 2018-08-30 NOTE — Progress Notes (Signed)
Subjective:    Patient ID: [name], male    DOB: 11-17-1941, 77 y.o.   MRN: [id number]  HPI   Here with 2-3 days acute onset feverish feeling, facial pain, pressure, headache, right neck pain with tender areas, general weakness and malaise, and greenish d/c, with mild ST and cough, but pt denies chest pain, wheezing, increased sob or doe, orthopnea, PND, increased LE swelling, palpitations, dizziness or syncope.  Pt denies new neurological symptoms such as new headache, or facial or extremity weakness or numbness   Pt denies polydipsia, polyuria  Did play golf with his pastor today.  No hx of ENT malignancy  Denies urinary symptoms such as dysuria, frequency, urgency, flank pain, hematuria or n/v, fever, chills. Past Medical History:  Diagnosis Date  . Anxiety   . Atrial fibrillation ([place])    post MI-amiodarone stopped 07/2008  . Benign prostatic hypertrophy   . CAD (coronary artery disease) 07/2008   2 BMS placed for MI, August, 2009  . Carotid artery disease without cerebral infarction ([place])   . CORONARY ARTERY BYPASS GRAFT, HX OF 07/13/2009   August, 2009, Dr. Cyndia Bent  . Depression   . Ejection fraction    .  Marland Kitchen History of nephrolithiasis   . Hx of CABG   . Hx of colonoscopy   . Hyperlipidemia   . HYPERLIPIDEMIA 02/09/2009   Qualifier: Diagnosis of  By: Jenny Reichmann MD, Hunt Oris   . Increased prostate specific antigen (PSA) velocity   . NEPHROLITHIASIS, HX OF 02/09/2009   Qualifier: Diagnosis of  By: Jenny Reichmann MD, Hunt Oris   . Prostatitis   . PSA elevation 09/06/2012  . Thrombocytopenia ([place])    Past Surgical History:  Procedure Laterality Date  . CORONARY ARTERY BYPASS GRAFT  08/05/08   x5, LIMA to the LAD.  Marland Kitchen CORONARY STENT PLACEMENT  07/2008   2 BMS to RCA  . HEMORRHOID SURGERY    . HYDROCELE EXCISION    . left leg broken    . PROSTATE BIOPSY  2007   neg  . ROTATOR CUFF REPAIR     bilateral; Dr. Noemi Chapel    reports that he has never smoked. He has never used smokeless tobacco. He  reports that he does not drink alcohol or use drugs. family history includes Healthy in his brother; Leukemia in his father; Lung cancer in his mother. Allergies  Allergen Reactions  . Niacin     REACTION: leg ache and itching   Current Outpatient Medications on File Prior to Visit  Medication Sig Dispense Refill  . aspirin 81 MG EC tablet Take 1 tablet (81 mg total) by mouth daily. Swallow whole. 30 tablet 12  . Multiple Vitamin (MULTIVITAMIN) tablet Take 1 tablet by mouth daily.    . Naproxen Sodium (ALEVE PO) Take by mouth as needed. Wrist pain    . simvastatin (ZOCOR) 40 MG tablet TAKE 1/2 (ONE-HALF) TABLET BY MOUTH AT BEDTIME 45 tablet 4  . Vitamin D, Ergocalciferol, (DRISDOL) 50000 units CAPS capsule TAKE 1 CAPSULE BY MOUTH ONCE A WEEK 12 capsule 0   No current facility-administered medications on file prior to visit.    Review of Systems  Constitutional: Negative for other unusual diaphoresis or sweats HENT: Negative for ear discharge or swelling Eyes: Negative for other worsening visual disturbances Respiratory: Negative for stridor or other swelling  Gastrointestinal: Negative for worsening distension or other blood Genitourinary: Negative for retention or other urinary change Musculoskeletal: Negative for other MSK pain or swelling  Skin: Negative for color change or other new lesions Neurological: Negative for worsening tremors and other numbness  Psychiatric/Behavioral: Negative for worsening agitation or other fatigue All other system neg per pt    Objective:   Physical Exam BP 116/72   Pulse 71   Temp 97.7 F (36.5 C) (Oral)   Ht 5\' 3"  (1.6 m)   Wt 134 lb (60.8 kg)   SpO2 97%   BMI 23.74 kg/m  VS noted, mild ill Constitutional: Pt appears in NAD HENT: Head: NCAT.  Right Ear: External ear normal.  Left Ear: External ear normal.  Bilat tm's with mild erythema.  Max sinus areas mild tender.  Pharynx with mild erythema, no exudate Eyes: . Pupils are equal,  round, and reactive to light. Conjunctivae and EOM are normal Nose: without d/c or deformity Neck: Neck supple. Gross normal ROM with right >> left tender lymphadnopathy along the anterior/posterior SCM's Cardiovascular: Normal rate and regular rhythm.   Pulmonary/Chest: Effort normal and breath sounds without rales or wheezing.  Abd:  Soft, NT, ND, + BS, no organomegaly Neurological: Pt is alert. At baseline orientation, motor grossly intact Skin: Skin is warm. No rashes, other new lesions, no LE edema Psychiatric: Pt behavior is normal without agitation  No other system neg per pt Lab Results  Component Value Date   WBC 6.3 04/23/2018   HGB 15.1 04/23/2018   HCT 43.4 04/23/2018   PLT 142.0 (L) 04/23/2018   GLUCOSE 107 (H) 04/23/2018   CHOL 138 04/23/2018   TRIG 160.0 (H) 04/23/2018   HDL 43.90 04/23/2018   LDLCALC 63 04/23/2018   ALT 14 04/23/2018   AST 23 04/23/2018   NA 139 04/23/2018   K 4.3 04/23/2018   CL 102 04/23/2018   CREATININE 1.13 04/23/2018   BUN 14 04/23/2018   CO2 30 04/23/2018   TSH 4.28 04/23/2018   PSA 7.45 (H) 04/23/2018   HGBA1C 6.1 04/23/2018          Assessment & Plan:

## 2018-09-09 ENCOUNTER — Telehealth: Payer: Self-pay | Admitting: Internal Medicine

## 2018-09-09 NOTE — Telephone Encounter (Signed)
OK to hold further antibiotics unless for worsening pain or fever, as some irritation can be left over after the infection

## 2018-09-09 NOTE — Telephone Encounter (Signed)
Copied from [place] [id number]. Topic: General - Other >> Sep 09, 2018  3:10 PM Lennox Solders wrote: Reason for CRM:pt saw dr Jenny Reichmann on 08-30-18. Pt has finished the abx on 09-08-18 and still has sore throat it is better but still sore. Please advise [facility] [address]

## 2018-09-10 NOTE — Telephone Encounter (Signed)
Pt has been informed.

## 2018-09-25 ENCOUNTER — Telehealth: Payer: Self-pay | Admitting: Cardiovascular Disease

## 2018-09-25 NOTE — Telephone Encounter (Signed)
Message sent to Dr.Nahser and Dr.Crenshaw.

## 2018-09-25 NOTE — Telephone Encounter (Signed)
New Message         Patient's wife is calling to schedule an appointment for the patient to see Dr. Stanford Breed, patient wants to switch from [facility] to [place]. Pls call and advise.

## 2018-09-25 NOTE — Telephone Encounter (Signed)
Ok with me [name]  

## 2018-09-25 NOTE — Telephone Encounter (Signed)
OK with me.

## 2018-09-25 NOTE — Telephone Encounter (Signed)
Returned call to patient.Appointment scheduled with Dr.Crenshaw 12/24/18 at 10:00 am.Directions given to patient.

## 2018-10-09 DIAGNOSIS — Z86010 Personal history of colonic polyps: Secondary | ICD-10-CM | POA: Diagnosis not present

## 2018-10-09 DIAGNOSIS — K5730 Diverticulosis of large intestine without perforation or abscess without bleeding: Secondary | ICD-10-CM | POA: Diagnosis not present

## 2018-10-09 DIAGNOSIS — Z1211 Encounter for screening for malignant neoplasm of colon: Secondary | ICD-10-CM | POA: Diagnosis not present

## 2018-10-09 LAB — HM COLONOSCOPY

## 2018-10-21 ENCOUNTER — Ambulatory Visit: Payer: Medicare Other | Admitting: Cardiology

## 2018-11-03 ENCOUNTER — Other Ambulatory Visit: Payer: Self-pay | Admitting: Internal Medicine

## 2018-11-04 NOTE — Telephone Encounter (Signed)
Very sorry, levaquin is not normally a chronic refillable medication, due to office policy and good antibiotic stewardship efforts;  Please consider OV if pt feels he may need an antibiotic

## 2018-11-04 NOTE — Telephone Encounter (Signed)
Not sure if there was a new question, thanks

## 2018-12-15 NOTE — Progress Notes (Signed)
HPI: FU CAD. Previously followed by Dr Acie Fredrickson. Had inferior MI 2009 with BMS to RCA followed by emergent CABG (LIMA to LAD, SVG to diagonal, SVG to PDA, sequential SVG to OM1 and OM2). Also with h/o HTN, hyperlipidemia and atrial fibrillation (post MI). Last echo 9/14 showed normal LV function, mild MR, mild RVE. Carotid dopplers 10/16 showed 1-39 bilateral stenosis. Since last seen, the patient denies any dyspnea on exertion, orthopnea, PND, pedal edema, palpitations, syncope or chest pain.   Current Outpatient Medications  Medication Sig Dispense Refill  . aspirin 81 MG EC tablet Take 1 tablet (81 mg total) by mouth daily. Swallow whole. 30 tablet 12  . Cholecalciferol (VITAMIN D3 PO) Take 3,000 Units by mouth daily.    . diazepam (VALIUM) 5 MG tablet Take 1 tablet (5 mg total) by mouth every 8 (eight) hours as needed (for dizziness). 10 tablet 0  . Multiple Vitamin (MULTIVITAMIN) tablet Take 1 tablet by mouth daily.    . ondansetron (ZOFRAN ODT) 4 MG disintegrating tablet Take 1 tablet (4 mg total) by mouth every 8 (eight) hours as needed for nausea or vomiting. 10 tablet 0  . simvastatin (ZOCOR) 40 MG tablet TAKE 1/2 (ONE-HALF) TABLET BY MOUTH AT BEDTIME (Patient taking differently: Take 20 mg by mouth daily at 6 PM. ) 45 tablet 4  . TURMERIC CURCUMIN PO Take 1 tablet by mouth daily.    . Vitamin D, Ergocalciferol, (DRISDOL) 50000 units CAPS capsule TAKE 1 CAPSULE BY MOUTH ONCE A WEEK 12 capsule 0   No current facility-administered medications for this visit.      Past Medical History:  Diagnosis Date  . Anxiety   . Atrial fibrillation ([place])    post MI-amiodarone stopped 07/2008  . Benign prostatic hypertrophy   . CAD (coronary artery disease) 07/2008   2 BMS placed for MI, August, 2009  . Carotid artery disease without cerebral infarction ([place])   . CORONARY ARTERY BYPASS GRAFT, HX OF 07/13/2009   August, 2009, Dr. Cyndia Bent  . Depression   . Ejection fraction    .  Marland Kitchen History  of nephrolithiasis   . Hx of CABG   . Hx of colonoscopy   . Hyperlipidemia   . HYPERLIPIDEMIA 02/09/2009   Qualifier: Diagnosis of  By: Jenny Reichmann MD, Hunt Oris   . Increased prostate specific antigen (PSA) velocity   . NEPHROLITHIASIS, HX OF 02/09/2009   Qualifier: Diagnosis of  By: Jenny Reichmann MD, Hunt Oris   . Prostatitis   . PSA elevation 09/06/2012  . Thrombocytopenia ([place])     Past Surgical History:  Procedure Laterality Date  . CORONARY ARTERY BYPASS GRAFT  08/05/08   x5, LIMA to the LAD.  Marland Kitchen CORONARY STENT PLACEMENT  07/2008   2 BMS to RCA  . HEMORRHOID SURGERY    . HYDROCELE EXCISION    . left leg broken    . PROSTATE BIOPSY  2007   neg  . ROTATOR CUFF REPAIR     bilateral; Dr. Noemi Chapel    Social History   Socioeconomic History  . Marital status: Married    Spouse name: Not on file  . Number of children: 1  . Years of education: Not on file  . Highest education level: Not on file  Occupational History  . Not on file  Social Needs  . Financial resource strain: Not on file  . Food insecurity:    Worry: Not on file    Inability: Not on file  .  Transportation needs:    Medical: Not on file    Non-medical: Not on file  Tobacco Use  . Smoking status: Never Smoker  . Smokeless tobacco: Never Used  Substance and Sexual Activity  . Alcohol use: No  . Drug use: No  . Sexual activity: Not on file  Lifestyle  . Physical activity:    Days per week: Not on file    Minutes per session: Not on file  . Stress: Not on file  Relationships  . Social connections:    Talks on phone: Not on file    Gets together: Not on file    Attends religious service: Not on file    Active member of club or organization: Not on file    Attends meetings of clubs or organizations: Not on file    Relationship status: Not on file  . Intimate partner violence:    Fear of current or ex partner: Not on file    Emotionally abused: Not on file    Physically abused: Not on file    Forced sexual activity: Not  on file  Other Topics Concern  . Not on file  Social History Narrative  . Not on file    Family History  Problem Relation Age of Onset  . Lung cancer Mother   . Leukemia Father   . Healthy Brother   . Colon cancer Neg Hx   . Rectal cancer Neg Hx   . Stomach cancer Neg Hx     ROS: no fevers or chills, productive cough, hemoptysis, dysphasia, odynophagia, melena, hematochezia, dysuria, hematuria, rash, seizure activity, orthopnea, PND, pedal edema, claudication. Remaining systems are negative.  Physical Exam: Well-developed well-nourished in no acute distress.  Skin is warm and dry.  HEENT is normal.  Neck is supple.  Chest is clear to auscultation with normal expansion.  Cardiovascular exam is regular rate and rhythm.  Abdominal exam nontender or distended. No masses palpated. Extremities show no edema. neuro grossly intact  A/P  1 coronary artery disease-patient doing well with no chest pain or dyspnea.  Continue medical therapy with aspirin and statin.  2 hypertension-patient's blood pressure is controlled on no medications.    3 hyperlipidemia-continue statin.    Kirk Ruths, MD

## 2018-12-17 ENCOUNTER — Encounter (HOSPITAL_COMMUNITY): Payer: Self-pay

## 2018-12-17 ENCOUNTER — Other Ambulatory Visit: Payer: Self-pay

## 2018-12-17 ENCOUNTER — Emergency Department (HOSPITAL_COMMUNITY)
Admission: EM | Admit: 2018-12-17 | Discharge: 2018-12-17 | Disposition: A | Discharge status: Home / Self Care | Payer: Medicare Other | Attending: Emergency Medicine | Admitting: Emergency Medicine

## 2018-12-17 DIAGNOSIS — R1110 Vomiting, unspecified: Secondary | ICD-10-CM | POA: Diagnosis not present

## 2018-12-17 DIAGNOSIS — I2510 Atherosclerotic heart disease of native coronary artery without angina pectoris: Secondary | ICD-10-CM | POA: Diagnosis not present

## 2018-12-17 DIAGNOSIS — Z79899 Other long term (current) drug therapy: Secondary | ICD-10-CM | POA: Diagnosis not present

## 2018-12-17 DIAGNOSIS — I10 Essential (primary) hypertension: Secondary | ICD-10-CM | POA: Diagnosis not present

## 2018-12-17 DIAGNOSIS — R0789 Other chest pain: Secondary | ICD-10-CM | POA: Diagnosis not present

## 2018-12-17 DIAGNOSIS — R42 Dizziness and giddiness: Secondary | ICD-10-CM | POA: Diagnosis not present

## 2018-12-17 DIAGNOSIS — I959 Hypotension, unspecified: Secondary | ICD-10-CM | POA: Diagnosis not present

## 2018-12-17 DIAGNOSIS — R079 Chest pain, unspecified: Secondary | ICD-10-CM | POA: Diagnosis not present

## 2018-12-17 DIAGNOSIS — R0602 Shortness of breath: Secondary | ICD-10-CM | POA: Diagnosis not present

## 2018-12-17 LAB — CBC
HCT: 44.4 % (ref 39.0–52.0)
Hemoglobin: 14.9 g/dL (ref 13.0–17.0)
MCH: 30.7 pg (ref 26.0–34.0)
MCHC: 33.6 g/dL (ref 30.0–36.0)
MCV: 91.5 fL (ref 80.0–100.0)
Platelets: 125 10*3/uL — ABNORMAL LOW (ref 150–400)
RBC: 4.85 MIL/uL (ref 4.22–5.81)
RDW: 12.1 % (ref 11.5–15.5)
WBC: 12 10*3/uL — AB (ref 4.0–10.5)
nRBC: 0 % (ref 0.0–0.2)

## 2018-12-17 LAB — COMPREHENSIVE METABOLIC PANEL
ALT: 19 U/L (ref 0–44)
AST: 34 U/L (ref 15–41)
Albumin: 4.3 g/dL (ref 3.5–5.0)
Alkaline Phosphatase: 36 U/L — ABNORMAL LOW (ref 38–126)
Anion gap: 11 (ref 5–15)
BILIRUBIN TOTAL: 1.3 mg/dL — AB (ref 0.3–1.2)
BUN: 14 mg/dL (ref 8–23)
CO2: 22 mmol/L (ref 22–32)
Calcium: 9.2 mg/dL (ref 8.9–10.3)
Chloride: 104 mmol/L (ref 98–111)
Creatinine, Ser: 1.19 mg/dL (ref 0.61–1.24)
GFR calc Af Amer: >60 mL/min (ref >60)
GFR, EST NON AFRICAN AMERICAN: 59 mL/min — AB (ref >60)
Glucose, Bld: 200 mg/dL — ABNORMAL HIGH (ref 70–99)
Potassium: 3.5 mmol/L (ref 3.5–5.1)
Sodium: 137 mmol/L (ref 135–145)
Total Protein: 7 g/dL (ref 6.5–8.1)

## 2018-12-17 LAB — LIPASE, BLOOD: Lipase: 36 U/L (ref 11–51)

## 2018-12-17 MED ORDER — MECLIZINE HCL 25 MG PO TABS
25.0000 mg | ORAL_TABLET | Freq: Once | ORAL | Status: AC
  Administered 2018-12-17: 25 mg via ORAL
  Filled 2018-12-17: qty 1

## 2018-12-17 MED ORDER — DIAZEPAM 5 MG PO TABS: 5.0000 mg | ORAL_TABLET | Freq: Three times a day (TID) | ORAL | 0 refills | Status: DC | PRN

## 2018-12-17 MED ORDER — SODIUM CHLORIDE 0.9 % IV BOLUS
1000.0000 mL | Freq: Once | INTRAVENOUS | Status: AC
  Administered 2018-12-17: 1000 mL via INTRAVENOUS

## 2018-12-17 MED ORDER — ONDANSETRON HCL 4 MG/2ML IJ SOLN
4.0000 mg | Freq: Once | INTRAMUSCULAR | Status: AC
  Administered 2018-12-17: 4 mg via INTRAVENOUS
  Filled 2018-12-17: qty 2

## 2018-12-17 MED ORDER — ONDANSETRON 4 MG PO TBDP: 4.0000 mg | ORAL_TABLET | Freq: Three times a day (TID) | ORAL | 0 refills | Status: DC | PRN

## 2018-12-17 MED ORDER — DIAZEPAM 5 MG PO TABS
5.0000 mg | ORAL_TABLET | Freq: Once | ORAL | Status: AC
  Administered 2018-12-17: 5 mg via ORAL
  Filled 2018-12-17: qty 1

## 2018-12-17 NOTE — ED Notes (Signed)
Patient was able to ambulate without any dizziness.

## 2018-12-17 NOTE — Discharge Instructions (Signed)
Make sure you are careful to get up slowly and make no sudden movements.  Take the nausea medication as needed and the Valium for dizziness as needed.  This should get better within the next few days.  If you start having any difficulty with your speech, facial drooping, one-sided weakness return immediately.

## 2018-12-17 NOTE — ED Notes (Signed)
Bed: WA06 Expected date:  Expected time:  Means of arrival:  Comments: EMS-N/V 

## 2018-12-17 NOTE — ED Notes (Signed)
Patient has not ambulated because he stated he was still very dizzy.

## 2018-12-17 NOTE — ED Triage Notes (Addendum)
Patient BIB EMS from home with complaints of nausea/vomiting/dizziness starting 2 hours ago. 18G Left AC. 4mg  IV zofran given by EMS.  EMS CBG= 180

## 2018-12-17 NOTE — ED Provider Notes (Addendum)
[facility] DEPT Provider Note   CSN: [id number] Arrival date & time: 12/17/18  1057     History   Chief Complaint Chief Complaint  Patient presents with  . Emesis  . Dizziness    HPI [name] is a 78 y.o. male.  Patient is a 78 year old male with a history of hypertension, coronary artery disease,, thrombocytopenia presenting today with dizziness and vomiting.  Patient states after waking up this morning he had a slight sensation of dizziness which improved and then became worse.  He states at one time things were spinning but also a significant sensation of being off balance.  It then caused him to start vomiting repeatedly and he started feeling very weak and sweaty.  He denied any unilateral weakness, numbness, facial droop, speech difficulties, syncope, chest pain or shortness of breath.  He states right now the symptoms have improved but he does not want a move.  The history is provided by the patient and the spouse.  Emesis  Severity:  Severe Duration:  4 hours Timing:  Constant Quality:  Stomach contents Progression:  Improving Chronicity:  New Recent urination:  Normal Relieved by:  Nothing Associated symptoms: no arthralgias, no chills, no diarrhea, no fever, no headaches and no myalgias   Associated symptoms comment:  Dizziness Risk factors: no alcohol use and no diabetes   Dizziness  Associated symptoms: vomiting   Associated symptoms: no diarrhea and no headaches     Past Medical History:  Diagnosis Date  . Anxiety   . Atrial fibrillation ([place])    post MI-amiodarone stopped 07/2008  . Benign prostatic hypertrophy   . CAD (coronary artery disease) 07/2008   2 BMS placed for MI, August, 2009  . Carotid artery disease without cerebral infarction ([place])   . CORONARY ARTERY BYPASS GRAFT, HX OF 07/13/2009   August, 2009, Dr. Cyndia Bent  . Depression   . Ejection fraction    .  Marland Kitchen History of nephrolithiasis   . Hx of CABG   . Hx  of colonoscopy   . Hyperlipidemia   . HYPERLIPIDEMIA 02/09/2009   Qualifier: Diagnosis of  By: Jenny Reichmann MD, Hunt Oris   . Increased prostate specific antigen (PSA) velocity   . NEPHROLITHIASIS, HX OF 02/09/2009   Qualifier: Diagnosis of  By: Jenny Reichmann MD, Hunt Oris   . Prostatitis   . PSA elevation 09/06/2012  . Thrombocytopenia [facility])     Patient Active Problem List   Diagnosis Date Noted  . Acute sinusitis 08/30/2018  . Acute pharyngitis 07/26/2017  . Partial degenerative rupture of right biceps tendon 07/18/2017  . Right lateral epicondylitis 07/18/2017  . Hyperglycemia 04/18/2017  . High foot arch 03/29/2016  . Arthritis, midfoot 03/29/2016  . Tibialis posterior tendinitis 03/29/2016  . Left foot pain 03/28/2016  . Mitral regurgitation 09/05/2013  . Carotid artery disease without cerebral infarction ([place])   . Hx of CABG   . Ejection fraction   . Preventative health care 08/30/2012  . Atrial fibrillation ([place])   . Thrombocytopenia ([place]) 07/26/2009  . Headache(784.0) 07/15/2009  . Hyperlipidemia 02/09/2009  . Anxiety state 02/09/2009  . Depression 02/09/2009  . BENIGN PROSTATIC HYPERTROPHY 02/09/2009  . PSA, INCREASED 02/09/2009  . NEPHROLITHIASIS, HX OF 02/09/2009  . CAD (coronary artery disease) 07/11/2008    Past Surgical History:  Procedure Laterality Date  . CORONARY ARTERY BYPASS GRAFT  08/05/08   x5, LIMA to the LAD.  Marland Kitchen CORONARY STENT PLACEMENT  07/2008   2 BMS  to RCA  . HEMORRHOID SURGERY    . HYDROCELE EXCISION    . left leg broken    . PROSTATE BIOPSY  2007   neg  . ROTATOR CUFF REPAIR     bilateral; Dr. Noemi Chapel        Home Medications    Prior to Admission medications   Medication Sig Start Date End Date Taking? Authorizing Provider  aspirin 81 MG EC tablet Take 1 tablet (81 mg total) by mouth daily. Swallow whole. 09/06/12   Biagio Borg, MD  Multiple Vitamin (MULTIVITAMIN) tablet Take 1 tablet by mouth daily.    [provider]  Naproxen Sodium  (ALEVE PO) Take by mouth as needed. Wrist pain    [provider]  simvastatin (ZOCOR) 40 MG tablet TAKE 1/2 (ONE-HALF) TABLET BY MOUTH AT BEDTIME 07/25/18   Biagio Borg, MD  Vitamin D, Ergocalciferol, (DRISDOL) 50000 units CAPS capsule TAKE 1 CAPSULE BY MOUTH ONCE A WEEK 06/06/18   Lyndal Pulley, DO    Family History Family History  Problem Relation Age of Onset  . Lung cancer Mother   . Leukemia Father   . Healthy Brother   . Colon cancer Neg Hx   . Rectal cancer Neg Hx   . Stomach cancer Neg Hx     Social History Social History   Tobacco Use  . Smoking status: Never Smoker  . Smokeless tobacco: Never Used  Substance Use Topics  . Alcohol use: No  . Drug use: No     Allergies   Niacin   Review of Systems Review of Systems  Constitutional: Negative for chills and fever.  Gastrointestinal: Positive for vomiting. Negative for diarrhea.  Musculoskeletal: Negative for arthralgias and myalgias.  Neurological: Positive for dizziness. Negative for headaches.  All other systems reviewed and are negative.    Physical Exam Updated Vital Signs BP 138/60   Pulse (!) 54   Temp 97.6 F (36.4 C) (Oral)   Resp 16   SpO2 95%   Physical Exam Vitals signs and nursing note reviewed.  Constitutional:      General: He is not in acute distress.    Appearance: He is well-developed.  HENT:     Head: Normocephalic and atraumatic.  Eyes:     Conjunctiva/sclera: Conjunctivae normal.     Pupils: Pupils are equal, round, and reactive to light.     Comments: No nystagmus at this time.  Neck:     Musculoskeletal: Normal range of motion and neck supple.  Cardiovascular:     Rate and Rhythm: Normal rate and regular rhythm.     Heart sounds: No murmur.  Pulmonary:     Effort: Pulmonary effort is normal. No respiratory distress.     Breath sounds: Normal breath sounds. No wheezing or rales.  Abdominal:     General: There is no distension.     Palpations: Abdomen is  soft.     Tenderness: There is no abdominal tenderness. There is no guarding or rebound.  Musculoskeletal: Normal range of motion.        General: No tenderness.  Skin:    General: Skin is warm and dry.     Findings: No erythema or rash.  Neurological:     General: No focal deficit present.     Mental Status: He is alert and oriented to person, place, and time.     Sensory: No sensory deficit.     Motor: No weakness or pronator drift.  Coordination: Coordination normal. Finger-Nose-Finger Test and Heel to Shin Test normal.  Psychiatric:        Behavior: Behavior normal.      ED Treatments / Results  Labs (all labs ordered are listed, but only abnormal results are displayed) Labs Reviewed  COMPREHENSIVE METABOLIC PANEL - Abnormal; Notable for the following components:      Result Value   Glucose, Bld 200 (*)    Alkaline Phosphatase 36 (*)    Total Bilirubin 1.3 (*)    GFR calc non Af Amer 59 (*)    All other components within normal limits  CBC - Abnormal; Notable for the following components:   WBC 12.0 (*)    Platelets 125 (*)    All other components within normal limits  LIPASE, BLOOD  URINALYSIS, ROUTINE W REFLEX MICROSCOPIC    EKG EKG Interpretation  Date/Time:  Tuesday December 17 2018 11:40:43 EST Ventricular Rate:  49 PR Interval:    QRS Duration: 100 QT Interval:  476 QTC Calculation: 430 R Axis:   77 Text Interpretation:  Sinus bradycardia Borderline T abnormalities, anterior leads No significant change since last tracing Confirmed by Blanchie Dessert [phone]) on 12/17/2018 12:02:58 PM   Radiology No results found.  Procedures Procedures (including critical care time)  Medications Ordered in ED Medications  ondansetron (ZOFRAN) injection 4 mg (4 mg Intravenous Given 12/17/18 1221)  sodium chloride 0.9 % bolus 1,000 mL (0 mLs Intravenous Stopped 12/17/18 1253)  meclizine (ANTIVERT) tablet 25 mg (25 mg Oral Given 12/17/18 1303)  diazepam (VALIUM) tablet  5 mg (5 mg Oral Given 12/17/18 1438)     Initial Impression / Assessment and Plan / ED Course  I have reviewed the triage vital signs and the nursing notes.  Pertinent labs & imaging results that were available during my care of the patient were reviewed by me and considered in my medical decision making (see chart for details).     Pt with sx most consistent with peripheral vertigo.  No systemic or infectious sx.  Normal neuro exam without weakness, ataxia or cerebellar findings on exam.  Normal vision.  Sx are reproducible with movement of the head and attempting to walk.  No hx of Stroke but pt does have risk factors and normal VS. Will treat for peripheral vertigo and re-eval.  Labs within normal lipase, CMP without acute findings and EKG shows sinus rhythm. Patient was given IV fluids, Zofran and meclizine and will reevaluate  3:55 PM Pt started feeling dizzy again requiring valium.  On re-eval states he is feeling better.  Will try to ambulate again.  4:42 PM Pt ambulated and ready to home.  Final Clinical Impressions(s) / ED Diagnoses   Final diagnoses:  Vertigo    ED Discharge Orders         Ordered    ondansetron (ZOFRAN ODT) 4 MG disintegrating tablet  Every 8 hours PRN     12/17/18 1641    diazepam (VALIUM) 5 MG tablet  Every 8 hours PRN     12/17/18 1641           Blanchie Dessert, MD 12/17/18 4128    Blanchie Dessert, MD 12/17/18 1643

## 2018-12-19 ENCOUNTER — Encounter: Payer: Self-pay | Admitting: Internal Medicine

## 2018-12-19 ENCOUNTER — Ambulatory Visit (INDEPENDENT_AMBULATORY_CARE_PROVIDER_SITE_OTHER): Payer: Medicare Other | Admitting: Internal Medicine

## 2018-12-19 VITALS — BP 104/64 | HR 106 | Temp 97.6°F | Ht 63.0 in | Wt 138.0 lb

## 2018-12-19 DIAGNOSIS — R42 Dizziness and giddiness: Secondary | ICD-10-CM | POA: Diagnosis not present

## 2018-12-19 DIAGNOSIS — J019 Acute sinusitis, unspecified: Secondary | ICD-10-CM | POA: Diagnosis not present

## 2018-12-19 DIAGNOSIS — R739 Hyperglycemia, unspecified: Secondary | ICD-10-CM

## 2018-12-19 MED ORDER — DIAZEPAM 5 MG PO TABS: 5.0000 mg | ORAL_TABLET | Freq: Three times a day (TID) | ORAL | 0 refills | Status: DC | PRN

## 2018-12-19 MED ORDER — ONDANSETRON 4 MG PO TBDP: 4.0000 mg | ORAL_TABLET | Freq: Three times a day (TID) | ORAL | 0 refills | Status: DC | PRN

## 2018-12-19 MED ORDER — AZITHROMYCIN 250 MG PO TABS: ORAL_TABLET | ORAL | 1 refills | Status: DC

## 2018-12-19 NOTE — Assessment & Plan Note (Signed)
Mild to mod, for antibx course,  to f/u any worsening symptoms or concerns 

## 2018-12-19 NOTE — Assessment & Plan Note (Signed)
For valium refill prn, declines meclizine for now

## 2018-12-19 NOTE — Progress Notes (Signed)
Subjective:    Patient ID: [name], male    DOB: 07-08-1941, 78 y.o.   MRN: [id number]  HPI  Here to f/u recent positional vertigo symptoms intermitent mod to severe x 3 days, seen at ED and tx with valium which worked nicely the first day, and even yesterday felt good.  Unforunately today has been trying to hold off taking the valium that makes him feel funny, and with recurrence of symptoms.  Willing to restart the valium, but wondering if other options.  Also   Here with 2-3 days acute onset fever, facial pain, pressure, headache, general weakness and malaise, and greenish d/c, with mild ST and cough, but pt denies chest pain, wheezing, increased sob or doe, orthopnea, PND, increased LE swelling, palpitations, dizziness or syncope. Past Medical History:  Diagnosis Date  . Anxiety   . Atrial fibrillation ([place])    post MI-amiodarone stopped 07/2008  . Benign prostatic hypertrophy   . CAD (coronary artery disease) 07/2008   2 BMS placed for MI, August, 2009  . Carotid artery disease without cerebral infarction ([place])   . CORONARY ARTERY BYPASS GRAFT, HX OF 07/13/2009   August, 2009, Dr. Cyndia Bent  . Depression   . Ejection fraction    .  Marland Kitchen History of nephrolithiasis   . Hx of CABG   . Hx of colonoscopy   . Hyperlipidemia   . HYPERLIPIDEMIA 02/09/2009   Qualifier: Diagnosis of  By: Jenny Reichmann MD, Hunt Oris   . Increased prostate specific antigen (PSA) velocity   . NEPHROLITHIASIS, HX OF 02/09/2009   Qualifier: Diagnosis of  By: Jenny Reichmann MD, Hunt Oris   . Prostatitis   . PSA elevation 09/06/2012  . Thrombocytopenia ([place])    Past Surgical History:  Procedure Laterality Date  . CORONARY ARTERY BYPASS GRAFT  08/05/08   x5, LIMA to the LAD.  Marland Kitchen CORONARY STENT PLACEMENT  07/2008   2 BMS to RCA  . HEMORRHOID SURGERY    . HYDROCELE EXCISION    . left leg broken    . PROSTATE BIOPSY  2007   neg  . ROTATOR CUFF REPAIR     bilateral; Dr. Noemi Chapel    reports that he has never smoked. He has never used  smokeless tobacco. He reports that he does not drink alcohol or use drugs. family history includes Healthy in his brother; Leukemia in his father; Lung cancer in his mother. Allergies  Allergen Reactions  . Niacin     REACTION: leg ache and itching   Current Outpatient Medications on File Prior to Visit  Medication Sig Dispense Refill  . aspirin 81 MG EC tablet Take 1 tablet (81 mg total) by mouth daily. Swallow whole. 30 tablet 12  . Cholecalciferol (VITAMIN D3 PO) Take 3,000 Units by mouth daily.    . Multiple Vitamin (MULTIVITAMIN) tablet Take 1 tablet by mouth daily.    . simvastatin (ZOCOR) 40 MG tablet TAKE 1/2 (ONE-HALF) TABLET BY MOUTH AT BEDTIME (Patient taking differently: Take 20 mg by mouth daily at 6 PM. ) 45 tablet 4  . TURMERIC CURCUMIN PO Take 1 tablet by mouth daily.    . Vitamin D, Ergocalciferol, (DRISDOL) 50000 units CAPS capsule TAKE 1 CAPSULE BY MOUTH ONCE A WEEK 12 capsule 0   No current facility-administered medications on file prior to visit.    Review of Systems  Constitutional: Negative for other unusual diaphoresis or sweats HENT: Negative for ear discharge or swelling Eyes: Negative for other worsening visual  disturbances Respiratory: Negative for stridor or other swelling  Gastrointestinal: Negative for worsening distension or other blood Genitourinary: Negative for retention or other urinary change Musculoskeletal: Negative for other MSK pain or swelling Skin: Negative for color change or other new lesions Neurological: Negative for worsening tremors and other numbness  Psychiatric/Behavioral: Negative for worsening agitation or other fatigue All other system neg per pt    Objective:   Physical Exam BP 104/64   Pulse (!) 106   Temp 97.6 F (36.4 C) (Oral)   Ht 5\' 3"  (1.6 m)   Wt 138 lb (62.6 kg)   SpO2 94%   BMI 24.45 kg/m  VS noted, mild ill Constitutional: Pt appears in NAD HENT: Head: NCAT.  Right Ear: External ear normal.  Left Ear:  External ear normal.  Bilat tm's with mild erythema.  Max sinus areas mild tender.  Pharynx with mild erythema, no exudate Eyes: . Pupils are equal, round, and reactive to light. Conjunctivae and EOM are normal Nose: without d/c or deformity Neck: Neck supple. Gross normal ROM Cardiovascular: Normal rate and regular rhythm.   Pulmonary/Chest: Effort normal and breath sounds without rales or wheezing.  Neurological: Pt is alert. At baseline orientation, motor grossly intact Skin: Skin is warm. No rashes, other new lesions, no LE edema Psychiatric: Pt behavior is normal without agitation  No other exam findings Lab Results  Component Value Date   WBC 12.0 (H) 12/17/2018   HGB 14.9 12/17/2018   HCT 44.4 12/17/2018   PLT 125 (L) 12/17/2018   GLUCOSE 200 (H) 12/17/2018   CHOL 138 04/23/2018   TRIG 160.0 (H) 04/23/2018   HDL 43.90 04/23/2018   LDLCALC 63 04/23/2018   ALT 19 12/17/2018   AST 34 12/17/2018   NA 137 12/17/2018   K 3.5 12/17/2018   CL 104 12/17/2018   CREATININE 1.19 12/17/2018   BUN 14 12/17/2018   CO2 22 12/17/2018   TSH 4.28 04/23/2018   PSA 7.45 (H) 04/23/2018   HGBA1C 6.1 04/23/2018       Assessment & Plan:

## 2018-12-19 NOTE — Assessment & Plan Note (Signed)
stable overall by history and exam, recent data reviewed with pt, and pt to continue medical treatment as before,  to f/u any worsening symptoms or concerns  

## 2018-12-19 NOTE — Patient Instructions (Signed)
Please take all new medication as prescribed - the antibiotic  Please continue all other medications as before, including the refills of the  Valium and nausea medication  You can also take Mucinex (or it's generic off brand) for congestion, and tylenol as needed for pain.  Please have the pharmacy call with any other refills you may need.  Please keep your appointments with your specialists as you may have planned

## 2018-12-24 ENCOUNTER — Encounter: Payer: Self-pay | Admitting: Cardiology

## 2018-12-24 ENCOUNTER — Ambulatory Visit (INDEPENDENT_AMBULATORY_CARE_PROVIDER_SITE_OTHER): Payer: Medicare Other | Admitting: Cardiology

## 2018-12-24 VITALS — BP 128/66 | HR 61 | Ht 63.0 in | Wt 135.6 lb

## 2018-12-24 DIAGNOSIS — I2510 Atherosclerotic heart disease of native coronary artery without angina pectoris: Secondary | ICD-10-CM | POA: Diagnosis not present

## 2018-12-24 DIAGNOSIS — E7800 Pure hypercholesterolemia, unspecified: Secondary | ICD-10-CM | POA: Diagnosis not present

## 2018-12-24 DIAGNOSIS — I10 Essential (primary) hypertension: Secondary | ICD-10-CM

## 2018-12-24 NOTE — Patient Instructions (Signed)

## 2019-02-20 DIAGNOSIS — Z961 Presence of intraocular lens: Secondary | ICD-10-CM | POA: Diagnosis not present

## 2019-02-20 DIAGNOSIS — H2512 Age-related nuclear cataract, left eye: Secondary | ICD-10-CM | POA: Diagnosis not present

## 2019-02-20 DIAGNOSIS — H52203 Unspecified astigmatism, bilateral: Secondary | ICD-10-CM | POA: Diagnosis not present

## 2019-02-20 DIAGNOSIS — H524 Presbyopia: Secondary | ICD-10-CM | POA: Diagnosis not present

## 2019-02-27 DIAGNOSIS — R3912 Poor urinary stream: Secondary | ICD-10-CM | POA: Diagnosis not present

## 2019-02-27 DIAGNOSIS — R972 Elevated prostate specific antigen [PSA]: Secondary | ICD-10-CM | POA: Diagnosis not present

## 2019-02-27 DIAGNOSIS — N401 Enlarged prostate with lower urinary tract symptoms: Secondary | ICD-10-CM | POA: Diagnosis not present

## 2019-05-06 DIAGNOSIS — R972 Elevated prostate specific antigen [PSA]: Secondary | ICD-10-CM | POA: Diagnosis not present

## 2019-05-12 DIAGNOSIS — R3912 Poor urinary stream: Secondary | ICD-10-CM | POA: Diagnosis not present

## 2019-05-12 DIAGNOSIS — R972 Elevated prostate specific antigen [PSA]: Secondary | ICD-10-CM | POA: Diagnosis not present

## 2019-05-12 DIAGNOSIS — N401 Enlarged prostate with lower urinary tract symptoms: Secondary | ICD-10-CM | POA: Diagnosis not present

## 2019-05-12 DIAGNOSIS — Z87442 Personal history of urinary calculi: Secondary | ICD-10-CM | POA: Diagnosis not present

## 2019-08-14 ENCOUNTER — Ambulatory Visit (INDEPENDENT_AMBULATORY_CARE_PROVIDER_SITE_OTHER): Payer: Medicare Other | Admitting: Internal Medicine

## 2019-08-14 ENCOUNTER — Other Ambulatory Visit: Payer: Self-pay

## 2019-08-14 ENCOUNTER — Encounter: Payer: Self-pay | Admitting: Internal Medicine

## 2019-08-14 VITALS — BP 120/74 | HR 68 | Temp 98.7°F | Ht 63.0 in | Wt 136.0 lb

## 2019-08-14 DIAGNOSIS — Z23 Encounter for immunization: Secondary | ICD-10-CM

## 2019-08-14 DIAGNOSIS — I2510 Atherosclerotic heart disease of native coronary artery without angina pectoris: Secondary | ICD-10-CM

## 2019-08-14 DIAGNOSIS — R42 Dizziness and giddiness: Secondary | ICD-10-CM

## 2019-08-14 DIAGNOSIS — R739 Hyperglycemia, unspecified: Secondary | ICD-10-CM | POA: Diagnosis not present

## 2019-08-14 DIAGNOSIS — H6992 Unspecified Eustachian tube disorder, left ear: Secondary | ICD-10-CM | POA: Diagnosis not present

## 2019-08-14 LAB — POCT GLYCOSYLATED HEMOGLOBIN (HGB A1C): Hemoglobin A1C: 6.1 % — AB (ref 4.0–5.6)

## 2019-08-14 MED ORDER — GUAIFENESIN ER 600 MG PO TB12: 1200.0000 mg | ORAL_TABLET | Freq: Two times a day (BID) | ORAL | 1 refills | Status: DC | PRN

## 2019-08-14 NOTE — Assessment & Plan Note (Signed)
Resolved, reassured, cont to monitor

## 2019-08-14 NOTE — Progress Notes (Signed)
Subjective:    Patient ID: [name], male    DOB: [birth year], 78 y.o.   MRN: [id number]  HPI  Here after episode severe vertigo and seen recenlty with ear infection a few mo ago and improved, but now he concerned about left ear still sloshing when bend forward and backward, without fever, pain, HA, ST, sinus congestion, cough and Pt denies chest pain, increased sob or doe, wheezing, orthopnea, PND, increased LE swelling, palpitations, dizziness or syncope.  Currently no recent vertigo, concerned about going to the mountains and needing an antibiotic.  No hearing loss.  Wt Readings from Last 3 Encounters:  08/14/19 136 lb (61.7 kg)  12/24/18 135 lb 9.6 oz (61.5 kg)  12/19/18 138 lb (62.6 kg)   Past Medical History:  Diagnosis Date  . Anxiety   . Atrial fibrillation ([place])    post MI-amiodarone stopped 07/2008  . Benign prostatic hypertrophy   . CAD (coronary artery disease) 07/2008   2 BMS placed for MI, August, 2009  . Carotid artery disease without cerebral infarction ([place])   . CORONARY ARTERY BYPASS GRAFT, HX OF 07/13/2009   August, 2009, Dr. Cyndia Bent  . Depression   . Ejection fraction    .  Marland Kitchen History of nephrolithiasis   . Hx of CABG   . Hx of colonoscopy   . Hyperlipidemia   . HYPERLIPIDEMIA 02/09/2009   Qualifier: Diagnosis of  By: Jenny Reichmann MD, Hunt Oris   . Increased prostate specific antigen (PSA) velocity   . NEPHROLITHIASIS, HX OF 02/09/2009   Qualifier: Diagnosis of  By: Jenny Reichmann MD, Hunt Oris   . Prostatitis   . PSA elevation 09/06/2012  . Thrombocytopenia ([place])    Past Surgical History:  Procedure Laterality Date  . CORONARY ARTERY BYPASS GRAFT  08/05/08   x5, LIMA to the LAD.  Marland Kitchen CORONARY STENT PLACEMENT  07/2008   2 BMS to RCA  . HEMORRHOID SURGERY    . HYDROCELE EXCISION    . left leg broken    . PROSTATE BIOPSY  2007   neg  . ROTATOR CUFF REPAIR     bilateral; Dr. Noemi Chapel    reports that he has never smoked. He has never used smokeless tobacco. He reports that he does  not drink alcohol or use drugs. family history includes Healthy in his brother; Leukemia in his father; Lung cancer in his mother. Allergies  Allergen Reactions  . Niacin     REACTION: leg ache and itching   Current Outpatient Medications on File Prior to Visit  Medication Sig Dispense Refill  . aspirin 81 MG EC tablet Take 1 tablet (81 mg total) by mouth daily. Swallow whole. 30 tablet 12  . Cholecalciferol (VITAMIN D3 PO) Take 3,000 Units by mouth daily.    . diazepam (VALIUM) 5 MG tablet Take 1 tablet (5 mg total) by mouth every 8 (eight) hours as needed (for dizziness). 10 tablet 0  . Multiple Vitamin (MULTIVITAMIN) tablet Take 1 tablet by mouth daily.    . ondansetron (ZOFRAN ODT) 4 MG disintegrating tablet Take 1 tablet (4 mg total) by mouth every 8 (eight) hours as needed for nausea or vomiting. 10 tablet 0  . simvastatin (ZOCOR) 40 MG tablet TAKE 1/2 (ONE-HALF) TABLET BY MOUTH AT BEDTIME (Patient taking differently: Take 20 mg by mouth daily at 6 PM. ) 45 tablet 4  . TURMERIC CURCUMIN PO Take 1 tablet by mouth daily.    . Vitamin D, Ergocalciferol, (DRISDOL) 50000 units CAPS  capsule TAKE 1 CAPSULE BY MOUTH ONCE A WEEK 12 capsule 0   No current facility-administered medications on file prior to visit.    Review of Systems  Constitutional: Negative for other unusual diaphoresis or sweats HENT: Negative for ear discharge or swelling Eyes: Negative for other worsening visual disturbances Respiratory: Negative for stridor or other swelling  Gastrointestinal: Negative for worsening distension or other blood Genitourinary: Negative for retention or other urinary change Musculoskeletal: Negative for other MSK pain or swelling Skin: Negative for color change or other new lesions Neurological: Negative for worsening tremors and other numbness  Psychiatric/Behavioral: Negative for worsening agitation or other fatigue All other system neg per pt    Objective:   Physical Exam BP 120/74    Pulse 68   Temp 98.7 F (37.1 C) (Oral)   Ht 5\' 3"  (1.6 m)   Wt 136 lb (61.7 kg)   SpO2 94%   BMI 24.09 kg/m  VS noted,  Constitutional: Pt appears in NAD HENT: Head: NCAT.  Right Ear: External ear normal.  Left Ear: External ear normal.  Left TM with mild erythema Eyes: . Pupils are equal, round, and reactive to light. Conjunctivae and EOM are normal Nose: without d/c or deformity Neck: Neck supple. Gross normal ROM Cardiovascular: Normal rate and regular rhythm.   Pulmonary/Chest: Effort normal and breath sounds without rales or wheezing.  Abd:  Soft, NT, ND, + BS, no organomegaly Neurological: Pt is alert. At baseline orientation, motor grossly intact Skin: Skin is warm. No rashes, other new lesions, no LE edema Psychiatric: Pt behavior is normal without agitation  No other exam findings  POCT glycosylated hemoglobin (Hb A1C) Order: [id number] Status:  Final result Visible to patient:  No (not released) Dx:  Hyperglycemia  Ref Range & Units 14:32 42yr ago 35yr ago 72yr ago  Hemoglobin A1C 4.0 - 5.6 % 6.1Abnormal   6.1 R, CM  6.2 R, CM  5.7 R, CM           Assessment & Plan:

## 2019-08-14 NOTE — Assessment & Plan Note (Signed)
D/w pt, no need antibx today, ok for mucinex bid prn,  to f/u any worsening symptoms or concerns

## 2019-08-14 NOTE — Patient Instructions (Signed)
Please take all new medication as prescribed - the mucinex as needed  Your A1c for sugar was OK today  Please continue all other medications as before, and refills have been done if requested.  Please have the pharmacy call with any other refills you may need.  Please continue your efforts at being more active, low cholesterol diet, and weight control.  Please keep your appointments with your specialists as you may have planned

## 2019-08-14 NOTE — Assessment & Plan Note (Signed)
stable overall by history and exam, recent data reviewed with pt, and pt to continue medical treatment as before,  to f/u any worsening symptoms or concerns  

## 2019-08-18 ENCOUNTER — Other Ambulatory Visit: Payer: Self-pay | Admitting: Internal Medicine

## 2019-09-03 ENCOUNTER — Other Ambulatory Visit: Payer: Self-pay | Admitting: Dermatology

## 2019-09-03 DIAGNOSIS — L820 Inflamed seborrheic keratosis: Secondary | ICD-10-CM | POA: Diagnosis not present

## 2019-09-03 DIAGNOSIS — D229 Melanocytic nevi, unspecified: Secondary | ICD-10-CM | POA: Diagnosis not present

## 2019-09-03 DIAGNOSIS — L821 Other seborrheic keratosis: Secondary | ICD-10-CM | POA: Diagnosis not present

## 2019-09-03 DIAGNOSIS — D485 Neoplasm of uncertain behavior of skin: Secondary | ICD-10-CM | POA: Diagnosis not present

## 2019-09-03 DIAGNOSIS — L570 Actinic keratosis: Secondary | ICD-10-CM | POA: Diagnosis not present

## 2019-11-17 DIAGNOSIS — N200 Calculus of kidney: Secondary | ICD-10-CM | POA: Diagnosis not present

## 2019-11-17 DIAGNOSIS — K432 Incisional hernia without obstruction or gangrene: Secondary | ICD-10-CM | POA: Diagnosis not present

## 2019-11-17 DIAGNOSIS — N5201 Erectile dysfunction due to arterial insufficiency: Secondary | ICD-10-CM | POA: Diagnosis not present

## 2020-01-27 ENCOUNTER — Telehealth: Payer: Self-pay | Admitting: *Deleted

## 2020-01-27 NOTE — Telephone Encounter (Signed)
A message was left, re: his follow up visit. 

## 2020-02-08 ENCOUNTER — Ambulatory Visit: Payer: Medicare Other | Attending: Internal Medicine

## 2020-02-08 DIAGNOSIS — Z23 Encounter for immunization: Secondary | ICD-10-CM

## 2020-02-08 NOTE — Progress Notes (Signed)
   Covid-19 Vaccination Clinic  Name:  [name]    MRN: [id number] DOB: [birth year]  02/08/2020  [name] was observed post Covid-19 immunization for 15 minutes without incidence. He was provided with Vaccine Information Sheet and instruction to access the V-Safe system.   [name] was instructed to call 911 with any severe reactions post vaccine: Marland Kitchen Difficulty breathing  . Swelling of your face and throat  . A fast heartbeat  . A bad rash all over your body  . Dizziness and weakness    Immunizations Administered    Name Date Dose VIS Date Route   Pfizer COVID-19 Vaccine 02/08/2020  9:45 AM 0.3 mL 11/21/2019 Intramuscular   Manufacturer: [place]   Lot: [id number]   [place]: [id number]

## 2020-03-09 ENCOUNTER — Ambulatory Visit: Payer: Medicare Other | Attending: Internal Medicine

## 2020-03-09 DIAGNOSIS — Z23 Encounter for immunization: Secondary | ICD-10-CM

## 2020-03-09 NOTE — Progress Notes (Signed)
   Covid-19 Vaccination Clinic  Name:  ONIEL KAREN    MRN: [id number] DOB: January 07, 1941  03/09/2020  Mr. Wynder was observed post Covid-19 immunization for 15 minutes without incident. He was provided with Vaccine Information Sheet and instruction to access the V-Safe system.   Mr. Muenchow was instructed to call 911 with any severe reactions post vaccine: Marland Kitchen Difficulty breathing  . Swelling of face and throat  . A fast heartbeat  . A bad rash all over body  . Dizziness and weakness   Immunizations Administered    Name Date Dose VIS Date Route   Pfizer COVID-19 Vaccine 03/09/2020 10:32 AM 0.3 mL 11/21/2019 Intramuscular   Manufacturer: [place]   Lot: [id number]   [place]: [id number]

## 2020-03-30 NOTE — Progress Notes (Deleted)
HPI: FU CAD. Previously followed by Dr Acie Fredrickson. Had inferior MI 2009 with BMS to RCA followed by emergent CABG (LIMA to LAD, SVG to diagonal, SVG to PDA, sequential SVG to OM1 and OM2). Also with h/o HTN, hyperlipidemia and atrial fibrillation (post MI). Last echo 9/14 showed normal LV function, mild MR, mild RVE. Carotid dopplers 10/16 showed 1-39 bilateral stenosis. Since last seen,   Current Outpatient Medications  Medication Sig Dispense Refill  . aspirin 81 MG EC tablet Take 1 tablet (81 mg total) by mouth daily. Swallow whole. 30 tablet 12  . Cholecalciferol (VITAMIN D3 PO) Take 3,000 Units by mouth daily.    . diazepam (VALIUM) 5 MG tablet Take 1 tablet (5 mg total) by mouth every 8 (eight) hours as needed (for dizziness). 10 tablet 0  . guaiFENesin (MUCINEX) 600 MG 12 hr tablet Take 2 tablets (1,200 mg total) by mouth 2 (two) times daily as needed (ear congestion). 60 tablet 1  . Multiple Vitamin (MULTIVITAMIN) tablet Take 1 tablet by mouth daily.    . ondansetron (ZOFRAN ODT) 4 MG disintegrating tablet Take 1 tablet (4 mg total) by mouth every 8 (eight) hours as needed for nausea or vomiting. 10 tablet 0  . simvastatin (ZOCOR) 40 MG tablet TAKE 1/2 (ONE-HALF) TABLET BY MOUTH AT BEDTIME 45 tablet 5  . TURMERIC CURCUMIN PO Take 1 tablet by mouth daily.    . Vitamin D, Ergocalciferol, (DRISDOL) 50000 units CAPS capsule TAKE 1 CAPSULE BY MOUTH ONCE A WEEK 12 capsule 0   No current facility-administered medications for this visit.     Past Medical History:  Diagnosis Date  . Anxiety   . Atrial fibrillation ([place])    post MI-amiodarone stopped 07/2008  . Benign prostatic hypertrophy   . CAD (coronary artery disease) 07/2008   2 BMS placed for MI, August, 2009  . Carotid artery disease without cerebral infarction ([place])   . CORONARY ARTERY BYPASS GRAFT, HX OF 07/13/2009   August, 2009, Dr. Cyndia Bent  . Depression   . Ejection fraction    .  Marland Kitchen History of nephrolithiasis   . Hx of  CABG   . Hx of colonoscopy   . Hyperlipidemia   . HYPERLIPIDEMIA 02/09/2009   Qualifier: Diagnosis of  By: Jenny Reichmann MD, Hunt Oris   . Increased prostate specific antigen (PSA) velocity   . NEPHROLITHIASIS, HX OF 02/09/2009   Qualifier: Diagnosis of  By: Jenny Reichmann MD, Hunt Oris   . Prostatitis   . PSA elevation 09/06/2012  . Thrombocytopenia ([place])     Past Surgical History:  Procedure Laterality Date  . CORONARY ARTERY BYPASS GRAFT  08/05/08   x5, LIMA to the LAD.  Marland Kitchen CORONARY STENT PLACEMENT  07/2008   2 BMS to RCA  . HEMORRHOID SURGERY    . HYDROCELE EXCISION    . left leg broken    . PROSTATE BIOPSY  2007   neg  . ROTATOR CUFF REPAIR     bilateral; Dr. Noemi Chapel    Social History   Socioeconomic History  . Marital status: Married    Spouse name: Not on file  . Number of children: 1  . Years of education: Not on file  . Highest education level: Not on file  Occupational History  . Not on file  Tobacco Use  . Smoking status: Never Smoker  . Smokeless tobacco: Never Used  Substance and Sexual Activity  . Alcohol use: No  . Drug use: No  .  Sexual activity: Not on file  Other Topics Concern  . Not on file  Social History Narrative  . Not on file   Social Determinants of Health   Financial Resource Strain:   . Difficulty of Paying Living Expenses:   Food Insecurity:   . Worried About Charity fundraiser in the Last Year:   . Arboriculturist in the Last Year:   Transportation Needs:   . Film/video editor (Medical):   Marland Kitchen Lack of Transportation (Non-Medical):   Physical Activity:   . Days of Exercise per Week:   . Minutes of Exercise per Session:   Stress:   . Feeling of Stress :   Social Connections:   . Frequency of Communication with Friends and Family:   . Frequency of Social Gatherings with Friends and Family:   . Attends Religious Services:   . Active Member of Clubs or Organizations:   . Attends Archivist Meetings:   Marland Kitchen Marital Status:   Intimate Partner  Violence:   . Fear of Current or Ex-Partner:   . Emotionally Abused:   Marland Kitchen Physically Abused:   . Sexually Abused:     Family History  Problem Relation Age of Onset  . Lung cancer Mother   . Leukemia Father   . Healthy Brother   . Colon cancer Neg Hx   . Rectal cancer Neg Hx   . Stomach cancer Neg Hx     ROS: no fevers or chills, productive cough, hemoptysis, dysphasia, odynophagia, melena, hematochezia, dysuria, hematuria, rash, seizure activity, orthopnea, PND, pedal edema, claudication. Remaining systems are negative.  Physical Exam: Well-developed well-nourished in no acute distress.  Skin is warm and dry.  HEENT is normal.  Neck is supple.  Chest is clear to auscultation with normal expansion.  Cardiovascular exam is regular rate and rhythm.  Abdominal exam nontender or distended. No masses palpated. Extremities show no edema. neuro grossly intact  ECG- personally reviewed  A/P  1 coronary artery disease-patient denies chest pain.  Continue aspirin and statin.  2 hypertension-blood pressure controlled.    3 hyperlipidemia-continue statin.  Kirk Ruths, MD

## 2020-04-06 ENCOUNTER — Ambulatory Visit: Payer: Medicare Other | Admitting: Cardiology

## 2020-04-08 ENCOUNTER — Encounter: Payer: Self-pay | Admitting: Cardiology

## 2020-04-08 ENCOUNTER — Ambulatory Visit (INDEPENDENT_AMBULATORY_CARE_PROVIDER_SITE_OTHER): Payer: Medicare Other | Admitting: Cardiology

## 2020-04-08 ENCOUNTER — Other Ambulatory Visit: Payer: Self-pay

## 2020-04-08 ENCOUNTER — Encounter: Payer: Self-pay | Admitting: *Deleted

## 2020-04-08 VITALS — BP 132/68 | HR 50 | Ht 63.0 in | Wt 136.0 lb

## 2020-04-08 DIAGNOSIS — I10 Essential (primary) hypertension: Secondary | ICD-10-CM

## 2020-04-08 DIAGNOSIS — I2510 Atherosclerotic heart disease of native coronary artery without angina pectoris: Secondary | ICD-10-CM

## 2020-04-08 DIAGNOSIS — E7800 Pure hypercholesterolemia, unspecified: Secondary | ICD-10-CM

## 2020-04-08 LAB — COMPREHENSIVE METABOLIC PANEL
ALT: 16 IU/L (ref 0–44)
AST: 28 IU/L (ref 0–40)
Albumin/Globulin Ratio: 1.7 (ref 1.2–2.2)
Albumin: 4.3 g/dL (ref 3.7–4.7)
Alkaline Phosphatase: 48 IU/L (ref 39–117)
BUN/Creatinine Ratio: 13 (ref 10–24)
BUN: 14 mg/dL (ref 8–27)
Bilirubin Total: 0.6 mg/dL (ref 0.0–1.2)
CO2: 25 mmol/L (ref 20–29)
Calcium: 9.5 mg/dL (ref 8.6–10.2)
Chloride: 103 mmol/L (ref 96–106)
Creatinine, Ser: 1.11 mg/dL (ref 0.76–1.27)
GFR calc Af Amer: 73 mL/min/{1.73_m2} (ref >59)
GFR calc non Af Amer: 63 mL/min/{1.73_m2} (ref >59)
Globulin, Total: 2.6 g/dL (ref 1.5–4.5)
Glucose: 105 mg/dL — ABNORMAL HIGH (ref 65–99)
Potassium: 5.2 mmol/L (ref 3.5–5.2)
Sodium: 139 mmol/L (ref 134–144)
Total Protein: 6.9 g/dL (ref 6.0–8.5)

## 2020-04-08 LAB — LIPID PANEL
Chol/HDL Ratio: 2.6 ratio (ref 0.0–5.0)
Cholesterol, Total: 127 mg/dL (ref 100–199)
HDL: 48 mg/dL (ref >39)
LDL Chol Calc (NIH): 60 mg/dL (ref 0–99)
Triglycerides: 101 mg/dL (ref 0–149)
VLDL Cholesterol Cal: 19 mg/dL (ref 5–40)

## 2020-04-08 LAB — CBC
Hematocrit: 42.9 % (ref 37.5–51.0)
Hemoglobin: 14.2 g/dL (ref 13.0–17.7)
MCH: 30.3 pg (ref 26.6–33.0)
MCHC: 33.1 g/dL (ref 31.5–35.7)
MCV: 92 fL (ref 79–97)
Platelets: 128 10*3/uL — ABNORMAL LOW (ref 150–450)
RBC: 4.68 x10E6/uL (ref 4.14–5.80)
RDW: 11.9 % (ref 11.6–15.4)
WBC: 6.3 10*3/uL (ref 3.4–10.8)

## 2020-04-08 NOTE — Progress Notes (Signed)
HPI: FU CAD. Had inferior MI 2009 with BMS to RCA followed by emergent CABG (LIMA to LAD, SVG to diagonal, SVG to PDA, sequential SVG to OM1 and OM2). Also with h/o HTN, hyperlipidemia and atrial fibrillation (post MI). Last echo 9/14 showed normal LV function, mild MR, mild RVE. Carotid dopplers 10/16 showed 1-39 bilateral stenosis. Since last seen, the patient denies any dyspnea on exertion, orthopnea, PND, pedal edema, palpitations, syncope or chest pain.   Current Outpatient Medications  Medication Sig Dispense Refill  . aspirin 81 MG EC tablet Take 1 tablet (81 mg total) by mouth daily. Swallow whole. 30 tablet 12  . Cholecalciferol (VITAMIN D3 PO) Take 3,000 Units by mouth daily.    . Multiple Vitamin (MULTIVITAMIN) tablet Take 1 tablet by mouth daily.    . simvastatin (ZOCOR) 40 MG tablet TAKE 1/2 (ONE-HALF) TABLET BY MOUTH AT BEDTIME 45 tablet 5  . TURMERIC CURCUMIN PO Take 1 tablet by mouth daily.    . Vitamin D, Ergocalciferol, (DRISDOL) 50000 units CAPS capsule TAKE 1 CAPSULE BY MOUTH ONCE A WEEK 12 capsule 0   No current facility-administered medications for this visit.     Past Medical History:  Diagnosis Date  . Anxiety   . Atrial fibrillation ([place])    post MI-amiodarone stopped 07/2008  . Benign prostatic hypertrophy   . CAD (coronary artery disease) 07/2008   2 BMS placed for MI, August, 2009  . Carotid artery disease without cerebral infarction ([place])   . CORONARY ARTERY BYPASS GRAFT, HX OF 07/13/2009   August, 2009, Dr. Cyndia Bent  . Depression   . Ejection fraction    .  Marland Kitchen History of nephrolithiasis   . Hx of CABG   . Hx of colonoscopy   . Hyperlipidemia   . HYPERLIPIDEMIA 02/09/2009   Qualifier: Diagnosis of  By: Jenny Reichmann MD, Hunt Oris   . Increased prostate specific antigen (PSA) velocity   . NEPHROLITHIASIS, HX OF 02/09/2009   Qualifier: Diagnosis of  By: Jenny Reichmann MD, Hunt Oris   . Prostatitis   . PSA elevation 09/06/2012  . Thrombocytopenia ([place])     Past Surgical  History:  Procedure Laterality Date  . CORONARY ARTERY BYPASS GRAFT  08/05/08   x5, LIMA to the LAD.  Marland Kitchen CORONARY STENT PLACEMENT  07/2008   2 BMS to RCA  . HEMORRHOID SURGERY    . HYDROCELE EXCISION    . left leg broken    . PROSTATE BIOPSY  2007   neg  . ROTATOR CUFF REPAIR     bilateral; Dr. Noemi Chapel    Social History   Socioeconomic History  . Marital status: Married    Spouse name: Not on file  . Number of children: 1  . Years of education: Not on file  . Highest education level: Not on file  Occupational History  . Not on file  Tobacco Use  . Smoking status: Never Smoker  . Smokeless tobacco: Never Used  Substance and Sexual Activity  . Alcohol use: No  . Drug use: No  . Sexual activity: Not on file  Other Topics Concern  . Not on file  Social History Narrative  . Not on file   Social Determinants of Health   Financial Resource Strain:   . Difficulty of Paying Living Expenses:   Food Insecurity:   . Worried About Charity fundraiser in the Last Year:   . [place] in the Last Year:  Transportation Needs:   . Film/video editor (Medical):   Marland Kitchen Lack of Transportation (Non-Medical):   Physical Activity:   . Days of Exercise per Week:   . Minutes of Exercise per Session:   Stress:   . Feeling of Stress :   Social Connections:   . Frequency of Communication with Friends and Family:   . Frequency of Social Gatherings with Friends and Family:   . Attends Religious Services:   . Active Member of Clubs or Organizations:   . Attends Archivist Meetings:   Marland Kitchen Marital Status:   Intimate Partner Violence:   . Fear of Current or Ex-Partner:   . Emotionally Abused:   Marland Kitchen Physically Abused:   . Sexually Abused:     Family History  Problem Relation Age of Onset  . Lung cancer Mother   . Leukemia Father   . Healthy Brother   . Colon cancer Neg Hx   . Rectal cancer Neg Hx   . Stomach cancer Neg Hx     ROS: no fevers or chills, productive  cough, hemoptysis, dysphasia, odynophagia, melena, hematochezia, dysuria, hematuria, rash, seizure activity, orthopnea, PND, pedal edema, claudication. Remaining systems are negative.  Physical Exam: Well-developed well-nourished in no acute distress.  Skin is warm and dry.  HEENT is normal.  Neck is supple.  Chest is clear to auscultation with normal expansion.  Cardiovascular exam is regular rate and rhythm.  Abdominal exam nontender or distended. No masses palpated. Extremities show no edema. neuro grossly intact  ECG-sinus bradycardia at a rate of 50, first-degree AV block.  Personally reviewed  A/P  1 coronary artery disease status post coronary artery bypass graft-patient denies recurrent chest pain.  Continue medical therapy with aspirin and statin.  2 hypertension-blood pressure controlled.  Check potassium and renal function.  I will also check hemoglobin.  3 hyperlipidemia-continue statin.  Check lipids and liver.  Kirk Ruths, MD

## 2020-04-08 NOTE — Patient Instructions (Signed)
Medication Instructions:  NO CHANGE *If you need a refill on your cardiac medications before your next appointment, please call your pharmacy*   Lab Work: Your physician recommends that you HAVE LAB WORK TODAY  If you have labs (blood work) drawn today and your tests are completely normal, you will receive your results only by: Marland Kitchen MyChart Message (if you have MyChart) OR . A paper copy in the mail If you have any lab test that is abnormal or we need to change your treatment, we will call you to review the results.   Follow-Up: At [facility], you and your health needs are our priority.  As part of our continuing mission to provide you with exceptional heart care, we have created designated Provider Care Teams.  These Care Teams include your primary Cardiologist (physician) and Advanced Practice Providers (APPs -  Physician Assistants and Nurse Practitioners) who all work together to provide you with the care you need, when you need it.  We recommend signing up for the patient portal called "MyChart".  Sign up information is provided on this After Visit Summary.  MyChart is used to connect with patients for Virtual Visits (Telemedicine).  Patients are able to view lab/test results, encounter notes, upcoming appointments, etc.  Non-urgent messages can be sent to your provider as well.   To learn more about what you can do with MyChart, go to [email or web address].    Your next appointment:   12 month(s)  The format for your next appointment:   Either In Person or Virtual  Provider:   You may see Kirk Ruths MD or one of the following Advanced Practice Providers on your designated Care Team:    Kerin Ransom, PA-C  [place], [place]  Coletta Memos, [place]

## 2020-04-09 ENCOUNTER — Ambulatory Visit (INDEPENDENT_AMBULATORY_CARE_PROVIDER_SITE_OTHER): Payer: Medicare Other

## 2020-04-09 ENCOUNTER — Ambulatory Visit: Payer: Self-pay

## 2020-04-09 ENCOUNTER — Ambulatory Visit (INDEPENDENT_AMBULATORY_CARE_PROVIDER_SITE_OTHER): Payer: Medicare Other | Admitting: Family Medicine

## 2020-04-09 ENCOUNTER — Encounter: Payer: Self-pay | Admitting: Family Medicine

## 2020-04-09 VITALS — BP 114/64 | HR 58 | Ht 63.0 in | Wt 135.8 lb

## 2020-04-09 DIAGNOSIS — M25572 Pain in left ankle and joints of left foot: Secondary | ICD-10-CM

## 2020-04-09 DIAGNOSIS — I2510 Atherosclerotic heart disease of native coronary artery without angina pectoris: Secondary | ICD-10-CM

## 2020-04-09 DIAGNOSIS — M7752 Other enthesopathy of left foot: Secondary | ICD-10-CM | POA: Insufficient documentation

## 2020-04-09 NOTE — Patient Instructions (Addendum)
Thank you for coming in today. I think you had retrocalcaneal bursitis. Use the aspercream and voltaren gel.  Use a compression sleeve with activity.  Use a boot if needed.  Recheck as needed. Happy to do an injection in the future.  Get xray today on your way out.

## 2020-04-09 NOTE — Progress Notes (Signed)
I, [name], LAT, ATC, am serving as scribe for Dr. [name].  [name] [place]) is a 79 y.o. male who presents to [place] at [facility] today for L ankle pain that began after playing 2 days ago.  He states that he has had problems w/ his L ankle for 25 years.  He was last seen by Dr. Tamala Julian on 03/14/18 for R arm pain.  Today, he reports ankle pain x 2 days after playing [place] in the mountains w/ extra stress into inversion.  He locates his pain to his R medial ankle and across the anterior ankle.  He rates his pain as moderate and describes his pain as sharp. He notes that his ankle pain was very severe initially and now improved quite a bit from yesterday.  Radiating pain: Some into his L foot Ankle swelling: No Ankle mechanical symptoms: No Aggravating factors: weight bearing activity; ankle DF Treatments tried: Aspercreme   Pertinent review of systems: No fevers or chills  Relevant historical information: History of prior ankle fracture   Exam:  BP 114/64 (BP Location: Right Arm, Patient Position: Sitting, Cuff Size: Normal)   Pulse (!) 58   Ht 5\' 3"  (1.6 m)   Wt 135 lb 12.8 oz (61.6 kg)   SpO2 96%   BMI 24.06 kg/m  General: Well Developed, well nourished, and in no acute distress.   MSK: Left foot and ankle Mature scar overlying lateral fibula. Slightly swollen posterior calcaneus area just anterior to Achilles tendon Normal-appearing otherwise. Tender palpation retrocalcaneal area on medial ankle.  Otherwise foot and ankle are nontender. Normal foot and ankle motion. Stable ligamentous exam. Intact strength.    Lab and Radiology Results  X-ray images left ankle obtained today personally and independently reviewed Mild degenerative changes.  No intact surgical hardware.  Sequelae of prior ankle surgery visible No acute fractures. Await formal radiology review  Diagnostic Limited MSK Ultrasound of: Left ankle Achilles tendon intact  normal-appearing Small retrocalcaneal bursitis present Posterior tibialis tendon and peroneal tendons intact normal-appearing without tear. No significant joint effusion anterior to medial to lateral ankle. Impression: Retrocalcaneal bursitis   Assessment and Plan: 79 y.o. male with improving left ankle pain.  Dominant finding today is retrocalcaneal bursitis.  Fortunately he is already having improvement with a little bit of rest and Aspercreme.  Discussed that we could proceed with steroid injection today however he like to wait a bit which I think is reasonable.  Plan for compressive ankle sleeve Aspercreme and Voltaren gel.  If not improving return to clinic will proceed with injection.  Recheck back as needed.   PDMP not reviewed this encounter. Orders Placed This Encounter  Procedures  . [place] LIMITED JOINT SPACE STRUCTURES LOW LEFT(NO LINKED CHARGES)    Order Specific Question:   Reason for Exam (SYMPTOM  OR DIAGNOSIS REQUIRED)    Answer:   L ankle pain    Order Specific Question:   Preferred imaging location?    Answer:   [place]  . DG Ankle Complete Left    Standing Status:   Future    Number of Occurrences:   1    Standing Expiration Date:   06/09/2021    Order Specific Question:   Reason for Exam (SYMPTOM  OR DIAGNOSIS REQUIRED)    Answer:   eval acute exac chronic ankle pain    Order Specific Question:   Preferred imaging location?    Answer:   [facility]    Order Specific Question:   Radiology Contrast Protocol - do NOT remove file path    Answer:   [email or web address]   No orders of the defined types were placed in this encounter.    Discussed warning signs or symptoms. Please see discharge instructions. Patient expresses understanding.   The above documentation has been reviewed and is accurate and complete [name]

## 2020-04-12 NOTE — Progress Notes (Signed)
Ankle x-ray shows evidence of prior ankle surgery but otherwise looks pretty normal.  If needed MRI could be done in the future to further characterize pain.

## 2020-06-17 ENCOUNTER — Ambulatory Visit: Payer: Medicare Other | Admitting: Cardiology

## 2020-08-04 DIAGNOSIS — H10412 Chronic giant papillary conjunctivitis, left eye: Secondary | ICD-10-CM | POA: Diagnosis not present

## 2020-08-31 ENCOUNTER — Other Ambulatory Visit: Payer: Self-pay | Admitting: Internal Medicine

## 2020-08-31 NOTE — Telephone Encounter (Signed)
Please refill as per office routine med refill policy (all routine meds refilled for 3 mo or monthly per pt preference up to one year from last visit, then month to month grace period for 3 mo, then further med refills will have to be denied)  

## 2020-09-01 ENCOUNTER — Other Ambulatory Visit: Payer: Self-pay | Admitting: Internal Medicine

## 2020-09-01 NOTE — Telephone Encounter (Signed)
Please refill as per office routine med refill policy (all routine meds refilled for 3 mo or monthly per pt preference up to one year from last visit, then month to month grace period for 3 mo, then further med refills will have to be denied)  

## 2020-11-29 ENCOUNTER — Other Ambulatory Visit: Payer: Self-pay | Admitting: Internal Medicine

## 2020-11-29 NOTE — Telephone Encounter (Signed)
Please refill as per office routine med refill policy (all routine meds refilled for 3 mo or monthly per pt preference up to one year from last visit, then month to month grace period for 3 mo, then further med refills will have to be denied)  

## 2020-12-15 DIAGNOSIS — N5201 Erectile dysfunction due to arterial insufficiency: Secondary | ICD-10-CM | POA: Diagnosis not present

## 2020-12-15 DIAGNOSIS — N200 Calculus of kidney: Secondary | ICD-10-CM | POA: Diagnosis not present

## 2020-12-15 DIAGNOSIS — R3912 Poor urinary stream: Secondary | ICD-10-CM | POA: Diagnosis not present

## 2020-12-15 DIAGNOSIS — N401 Enlarged prostate with lower urinary tract symptoms: Secondary | ICD-10-CM | POA: Diagnosis not present

## 2020-12-20 ENCOUNTER — Ambulatory Visit: Payer: Self-pay | Admitting: Surgery

## 2020-12-20 ENCOUNTER — Encounter: Payer: Self-pay | Admitting: Surgery

## 2020-12-20 DIAGNOSIS — Z951 Presence of aortocoronary bypass graft: Secondary | ICD-10-CM | POA: Diagnosis not present

## 2020-12-20 DIAGNOSIS — K432 Incisional hernia without obstruction or gangrene: Secondary | ICD-10-CM | POA: Diagnosis not present

## 2020-12-20 NOTE — H&P (Signed)
Nonnie Done Appointment: 12/20/2020 9:30 AM Location: [facility] Patient #: [id number] DOB: 1941/09/04 Married / Language: Cleophus Molt / Race: White Male  History of Present Illness [name] MD; 12/20/2020 12:39 PM) The patient is a 80 year old male who presents with an incisional hernia. Note for "Incisional hernia": ` ` ` Patient sent for surgical consultation at the request of Irine Seal, Urology  Chief Complaint: Recurrent swelling on left flank. Probable hernia. ` ` The patient is a pleasant, active, thin male that has struggled with kidney stones for most of his life. Required open flank incision removal numerous stones many decades ago. He developed an incisional hernia. Recalls getting a repair by Dr. Harlow Asa probably about 20 years and an open fashion. He noticed some swelling there is any or 2 afterwards. Not particularly bothersome. However he is noted increasing swelling this past year. He is very active. Yardwork and exercises. Also regulate. He noted after doing leave clean up in the yard a few months ago he started getting worsening sharp pain on that left side. It is much more sensitive. He discussed with his urologist. Recurrent hernia suspected. Surgical consultation offered.  Patient does have a history of coronary disease. Had a myocardial infarction all bleeding required a bypass surgery by Dr. Cyndia Bent in 2012. Seems okay. He cannot recall for certain his cardiologist but records imply that its Dr. Dola Argyle. He is not on blood thinners. He takes anticholesterol medication. No diabetes. No sleep apnea. Moves his bowels every morning. No problems with urination or defecation. Again walk several miles without difficulty. He does not smoke  (Review of systems as stated in this history (HPI) or in the review of systems. Otherwise all other 12 point ROS are negative) ` ` ###########################################`  This patient  encounter took 40 minutes today to perform the following: obtain history, perform exam, review outside records, interpret tests & imaging, counsel the patient on their diagnosis; and, document this encounter, including findings & plan in the electronic health record (EHR).   Past Surgical History [name], MD; 12/20/2020 10:00 AM) Coronary Artery Bypass Graft Ventral Hernia Repair [04/16/2006]: Left flank incisional hernia from prior kidney stone removal. Open repair with 25 x 12 cm mesh. Dr. Harlow Asa  Allergies [facility] Marshall-McBride, CMA; 12/20/2020 9:23 AM) No Known Drug Allergies [12/20/2020]: No Known Allergies [12/20/2020]: Allergies Reconciled  Medication History (Kheana Marshall-McBride, CMA; 12/20/2020 9:23 AM) Simvastatin (40MG  Tablet, Oral) Active. Medications Reconciled  Other Problems (Kheana Marshall-McBride, CMA; 12/20/2020 9:23 AM) No pertinent past medical history     Review of Systems (Kheana Marshall-McBride CMA; 12/20/2020 9:23 AM) General Not Present- Appetite Loss, Chills, Fatigue, Fever, Night Sweats, Weight Gain and Weight Loss. Skin Not Present- Change in Wart/Mole, Dryness, Hives, Jaundice, New Lesions, Non-Healing Wounds, Rash and Ulcer. HEENT Not Present- Earache, Hearing Loss, Hoarseness, Nose Bleed, Oral Ulcers, Ringing in the Ears, Seasonal Allergies, Sinus Pain, Sore Throat, Visual Disturbances, Wears glasses/contact lenses and Yellow Eyes. Respiratory Not Present- Bloody sputum, Chronic Cough, Difficulty Breathing, Snoring and Wheezing. Breast Not Present- Breast Mass, Breast Pain, Nipple Discharge and Skin Changes. Cardiovascular Not Present- Chest Pain, Difficulty Breathing Lying Down, Leg Cramps, Palpitations, Rapid Heart Rate, Shortness of Breath and Swelling of Extremities. Gastrointestinal Present- Hemorrhoids. Not Present- Abdominal Pain, Bloating, Bloody Stool, Change in Bowel Habits, Chronic diarrhea, Constipation, Difficulty  Swallowing, Excessive gas, Gets full quickly at meals, Indigestion, Nausea, Rectal Pain and Vomiting.  Vitals (Kheana Marshall-McBride CMA; 12/20/2020 9:24 AM) 12/20/2020 9:23 AM  Weight: 134.38 lb Height: 5in Body Surface Area: 0.26 m Body Mass Index: 3779 kg/m  Temp.: 98.88F  Pulse: 82 (Regular)  P.OX: 94% (Room air) BP: 110/68(Sitting, Left Arm, Standard)        Physical Exam [name] MD; 12/20/2020 12:40 PM)  General Mental Status-Alert. General Appearance-Not in acute distress, Not Sickly. Orientation-Oriented X3. Hydration-Well hydrated. Voice-Normal.  Integumentary Global Assessment Upon inspection and palpation of skin surfaces of the - Axillae: non-tender, no inflammation or ulceration, no drainage. and Distribution of scalp and body hair is normal. General Characteristics Temperature - normal warmth is noted.  Head and Neck Head-normocephalic, atraumatic with no lesions or palpable masses. Face Global Assessment - atraumatic, no absence of expression. Neck Global Assessment - no abnormal movements, no bruit auscultated on the right, no bruit auscultated on the left, no decreased range of motion, non-tender. Trachea-midline. Thyroid Gland Characteristics - non-tender.  Eye Eyeball - Left-Extraocular movements intact, No Nystagmus - Left. Eyeball - Right-Extraocular movements intact, No Nystagmus - Right. Cornea - Left-No Hazy - Left. Cornea - Right-No Hazy - Right. Sclera/Conjunctiva - Left-No scleral icterus, No Discharge - Left. Sclera/Conjunctiva - Right-No scleral icterus, No Discharge - Right. Pupil - Left-Direct reaction to light normal. Pupil - Right-Direct reaction to light normal.  ENMT Ears Pinna - Left - no drainage observed, no generalized tenderness observed. Pinna - Right - no drainage observed, no generalized tenderness observed. Nose and Sinuses External Inspection of the Nose - no  destructive lesion observed. Inspection of the nares - Left - quiet respiration. Inspection of the nares - Right - quiet respiration. Mouth and Throat Lips - Upper Lip - no fissures observed, no pallor noted. Lower Lip - no fissures observed, no pallor noted. Nasopharynx - no discharge present. Oral Cavity/Oropharynx - Tongue - no dryness observed. Oral Mucosa - no cyanosis observed. Hypopharynx - no evidence of airway distress observed.  Chest and Lung Exam Inspection Movements - Normal and Symmetrical. Accessory muscles - No use of accessory muscles in breathing. Palpation Palpation of the chest reveals - Non-tender. Auscultation Breath sounds - Normal and Clear.  Cardiovascular Auscultation Rhythm - Regular. Murmurs & Other Heart Sounds - Auscultation of the heart reveals - No Murmurs and No Systolic Clicks.  Abdomen Inspection Inspection of the abdomen reveals - No Visible peristalsis and No Abnormal pulsations. Umbilicus - No Bleeding, No Urine drainage. Palpation/Percussion Palpation and Percussion of the abdomen reveal - Soft, Non Tender, No Rebound tenderness, No Rigidity (guarding) and No Cutaneous hyperesthesia. Note: Obvious left flank bulging 18x7 cm region. Some Likely due to some stretching/denervation from flank incision. Bulging and sensitivity more sensitive along the anterior axial line.    Abdomen soft. Nontender. Not distended. No umbilical or other incisional hernias. No guarding.  Male Genitourinary Sexual Maturity Tanner 5 - Adult hair pattern and Adult penile size and shape. Note: Nor external male genitalia. Mild bulging in left groin suspicious for left inguinal hernia. No definite right inguinal hernia.  Peripheral Vascular Upper Extremity Inspection - Left - No Cyanotic nailbeds - Left, Not Ischemic. Inspection - Right - No Cyanotic nailbeds - Right, Not Ischemic.  Neurologic Neurologic evaluation reveals -normal attention span and ability to  concentrate, able to name objects and repeat phrases. Appropriate fund of knowledge , normal sensation and normal coordination. Mental Status Affect - not angry, not paranoid. Cranial Nerves-Normal Bilaterally. Gait-Normal.  Neuropsychiatric Mental status exam performed with findings of-able to articulate well with normal speech/language, rate, volume and coherence, thought  content normal with ability to perform basic computations and apply abstract reasoning and no evidence of hallucinations, delusions, obsessions or homicidal/suicidal ideation.  Musculoskeletal Global Assessment Spine, Ribs and Pelvis - no instability, subluxation or laxity. Right Upper Extremity - no instability, subluxation or laxity.  Lymphatic Head & Neck  General Head & Neck Lymphatics: Bilateral - Description - No Localized lymphadenopathy. Axillary  General Axillary Region: Bilateral - Description - No Localized lymphadenopathy. Femoral & Inguinal  Generalized Femoral & Inguinal Lymphatics: Left - Description - No Localized lymphadenopathy. Right - Description - No Localized lymphadenopathy.    Assessment & Plan [name] MD; 12/20/2020 10:00 AM)  RECURRENT INCISIONAL HERNIA (K43.2) Impression: Pleasant active thin nonsmoking gentleman with obvious recurrent left flank hernia also was some bowing/diastases most likely due to denervation from his large left flank incision to remove kidney stones decades before then.  I think he would benefit from hernia repair since it is getting larger he's had episodes of sharp pain in its become more sensitive. We'll plan laparoscopic approach with the patient in partial decubitus left side up. Possibly flank fixed left inguinal hernia as well. Spectral need large sheet of mesh. He is interested in proceeding. Had a long discussion with him and try to address his questions and concerns. I cautioned against him being to intensely active for the first 6 weeks to  avoid recurrence.   PREOP - [place] - ENCOUNTER FOR PREOPERATIVE EXAMINATION FOR GENERAL SURGICAL PROCEDURE (Z01.818)  Current Plans You are being scheduled for surgery- Our schedulers will call you.  You should hear from our office's scheduling department within 5 working days about the location, date, and time of surgery. We try to make accommodations for patient's preferences in scheduling surgery, but sometimes the OR schedule or the surgeon's schedule prevents [place] from making those accommodations.  If you have not heard from our office [phone]) in 5 working days, call the office and ask for your surgeon's nurse.  If you have other questions about your diagnosis, plan, or surgery, call the office and ask for your surgeon's nurse.  Written instructions provided [facility] Consent - Hernia Repair - Ventral/Incisional/Umbilical (Jevon Shells): discussed with patient and provided information. Pt Education - [facility] Hernia Post-Op HCI (Leeam Cedrone): discussed with patient and provided information. Pt Education - [facility] Pain Control (Merryl Buckels) Pt Education - Pamphlet Given - Laparoscopic Hernia Repair: discussed with patient and provided information. Pt Education - [facility] Mesh education: discussed with patient and provided information.  S/P CABG (CORONARY ARTERY BYPASS GRAFT) (Z95.1)  Current Plans I recommended obtaining preoperative cardiac clearance. I am concerned about the health of the patient and the ability to tolerate the operation. Therefore, we will request clearance by cardiology to better assess operative risk & see if a reevaluation, further workup, etc is needed. Also recommendations on how medications such as for anticoagulation and blood pressure should be managed/held/restarted after surgery.  [name], MD, FACS, MASCRS Gastrointestinal and Minimally Invasive Surgery  [facility] [address], [place],  [address] [phone] Fax [phone]  Main/Paging  CONTACT INFORMATION: Weekday (9AM-5PM) concerns: Call [facility] main office at [phone] Weeknight (5PM-9AM) or Weekend/Holiday concerns: Check [email or web address] for General Surgery [facility] coverage (Please, do not use SecureChat as it is not reliable communication to operating surgeons for immediate patient care)

## 2020-12-22 ENCOUNTER — Telehealth: Payer: Self-pay | Admitting: Cardiology

## 2020-12-22 NOTE — Telephone Encounter (Signed)
   Primary Cardiologist: [name] Ruths, MD  Chart reviewed as part of pre-operative protocol coverage. Because of [name] past medical history and time since last visit, he will require a follow-up visit in order to better assess preoperative cardiovascular risk.  Pre-op covering staff: - Please schedule appointment and call patient to inform them. If patient already had an upcoming appointment within acceptable timeframe, please add "pre-op clearance" to the appointment notes so provider is aware. - Please contact requesting surgeon's office via preferred method (i.e, phone, fax) to inform them of need for appointment prior to surgery.  If applicable, this message will also be routed to pharmacy pool and/or primary cardiologist for input on holding anticoagulant/antiplatelet agent as requested below so that this information is available to the clearing provider at time of patient's appointment.   [place], [place]  12/22/2020, 12:27 PM

## 2020-12-22 NOTE — Telephone Encounter (Signed)
Spoke with patient's wife. Explained that patient needs appointment for clearance. Scheduled appointment Dec 27, 2020 with Dr. Stanford Breed  Attempted to call [facility] office x2 and was unable to get thru.   This note has been routed via [employer] fax to [place]

## 2020-12-22 NOTE — Telephone Encounter (Signed)
   [facility] Pre-operative Risk Assessment   Request for surgical clearance:  1. What type of surgery is being performed? Recurrent ventral hernia and left inguinal hernia repair   2. When is this surgery scheduled? TBD   3. What type of clearance is required (medical clearance vs. Pharmacy clearance to hold med vs. Both)? Both   4. Are there any medications that need to be held prior to surgery and how long? None specific - on ASA   5. Practice name and name of physician performing surgery? Dr. Michael Boston with [facility]   6. What is the office phone number? [phone]   7.   What is the office fax number? [phone] (Attn: Illene Regulus CMA)  8.   Anesthesia type (None, local, MAC, general) ? General    Sheral Apley M 12/22/2020, 12:11 PM  _________________________________________________________________   (provider comments below)

## 2020-12-23 ENCOUNTER — Encounter: Payer: Self-pay | Admitting: *Deleted

## 2020-12-27 ENCOUNTER — Ambulatory Visit: Payer: Medicare Other | Admitting: Cardiology

## 2020-12-27 NOTE — Progress Notes (Signed)
Cardiology Clinic Note   Patient Name: [name] Date of Encounter: 12/29/2020  Primary Care Provider:  Biagio Borg, MD Primary Cardiologist:  Kirk Ruths, MD  Patient Profile    [name] 80 year old male presents the clinic today for follow-up evaluation of his atrial fibrillation and coronary artery disease.  Past Medical History    Past Medical History:  Diagnosis Date  . Anxiety   . Atrial fibrillation ([place])    post MI-amiodarone stopped 07/2008  . Benign prostatic hypertrophy   . CAD (coronary artery disease) 07/2008   2 BMS placed for MI, August, 2009  . Carotid artery disease without cerebral infarction ([place])   . CORONARY ARTERY BYPASS GRAFT, HX OF 07/13/2009   August, 2009, Dr. Cyndia Bent  . Depression   . Ejection fraction    .  Marland Kitchen History of nephrolithiasis   . Hx of CABG   . Hx of colonoscopy   . Hyperlipidemia   . HYPERLIPIDEMIA 02/09/2009   Qualifier: Diagnosis of  By: Jenny Reichmann MD, Hunt Oris   . Increased prostate specific antigen (PSA) velocity   . NEPHROLITHIASIS, HX OF 02/09/2009   Qualifier: Diagnosis of  By: Jenny Reichmann MD, Hunt Oris   . Prostatitis   . PSA elevation 09/06/2012  . Thrombocytopenia ([place])    Past Surgical History:  Procedure Laterality Date  . CORONARY ARTERY BYPASS GRAFT  08/05/08   x5, LIMA to the LAD.  Marland Kitchen CORONARY STENT PLACEMENT  07/2008   2 BMS to RCA  . HEMORRHOID SURGERY    . HYDROCELE EXCISION    . INCISIONAL HERNIA REPAIR  04/16/2006   Left flank incisional hernia repair with mesh.  Dr Harlow Asa  . left leg broken    . PROSTATE BIOPSY  2007   neg  . ROTATOR CUFF REPAIR     bilateral; Dr. Noemi Chapel    Allergies  Allergies  Allergen Reactions  . Niacin     REACTION: leg ache and itching    History of Present Illness    [name] has a PMH of atrial fibrillation, carotid artery disease, mitral valve regurgitation, BPH, hyperlipidemia, thrombocytopenia anxiety, depression, vertigo, and coronary artery disease status post CABG  x5 2009 (LIMA to LAD, SVG to diagonal, SVG to PDA, sequential SVG to OM1 and OM2).  He had inferior MI 2009 with bare-metal stent to his RCA followed by emergent CABG.  Atrial fibrillation was noted post MI.  His echocardiogram 9/14 showed normal LV function, mild MR, mild right ventricular enlargement.  His carotid Dopplers 10/16 showed 1-39% bilateral stenosis.  His last seen by Dr. Stanford Breed on 04/08/2020.  During that time he denied dyspnea with increased exertion, orthopnea, PND, lower extremity edema, palpitations, syncope, and chest pain.  He presents the clinic today for follow-up evaluation states he feels well.  He continues to be very physically active.  However, his physical activity is limited by his left ventral hernia.  He underwent surgery for kidney stones via open incision.  This will be a repeat hernia repair of his hernia.  He previously had mesh placed in a position however, the place herniated again.  He also stated that with previously increasing his simvastatin he was noted to have a platelet drop.  He is now taking 20 mg of simvastatin and his cholesterol is well controlled.  We reviewed his RCRI risk.  I will give him the salty 6 diet sheet, have him maintain his physical activity, clear him for his upcoming hernia surgery,  and have him follow-up in 1 year.  Today he denies chest pain, shortness of breath, lower extremity edema, fatigue, palpitations, melena, hematuria, hemoptysis, diaphoresis, weakness, presyncope, syncope, orthopnea, and PND.   Home Medications    Prior to Admission medications   Medication Sig Start Date End Date Taking? Authorizing Provider  aspirin 81 MG EC tablet Take 1 tablet (81 mg total) by mouth daily. Swallow whole. 09/06/12   Biagio Borg, MD  Cholecalciferol (VITAMIN D3 PO) Take 3,000 Units by mouth daily.    [provider]  Multiple Vitamin (MULTIVITAMIN) tablet Take 1 tablet by mouth daily.    [provider]  simvastatin  (ZOCOR) 40 MG tablet TAKE 1/2 (ONE-HALF) TABLET BY MOUTH AT BEDTIME 11/29/20   Biagio Borg, MD  TURMERIC CURCUMIN PO Take 1 tablet by mouth daily.    [provider]  Vitamin D, Ergocalciferol, (DRISDOL) 50000 units CAPS capsule TAKE 1 CAPSULE BY MOUTH ONCE A WEEK 06/06/18   Lyndal Pulley, DO    Family History    Family History  Problem Relation Age of Onset  . Lung cancer Mother   . Leukemia Father   . Healthy Brother   . Colon cancer Neg Hx   . Rectal cancer Neg Hx   . Stomach cancer Neg Hx    He indicated that his mother is deceased. He indicated that his father is deceased. He indicated that the status of his brother is unknown. He indicated that his maternal grandmother is deceased. He indicated that his maternal grandfather is deceased. He indicated that his paternal grandmother is deceased. He indicated that his paternal grandfather is deceased. He indicated that the status of his neg hx is unknown.  Social History    Social History   Socioeconomic History  . Marital status: Married    Spouse name: Not on file  . Number of children: 1  . Years of education: Not on file  . Highest education level: Not on file  Occupational History  . Not on file  Tobacco Use  . Smoking status: Never Smoker  . Smokeless tobacco: Never Used  Substance and Sexual Activity  . Alcohol use: No  . Drug use: No  . Sexual activity: Not on file  Other Topics Concern  . Not on file  Social History Narrative  . Not on file   Social Determinants of Health   Financial Resource Strain: Not on file  Food Insecurity: Not on file  Transportation Needs: Not on file  Physical Activity: Not on file  Stress: Not on file  Social Connections: Not on file  Intimate Partner Violence: Not on file     Review of Systems    General:  No chills, fever, night sweats or weight changes.  Cardiovascular:  No chest pain, dyspnea on exertion, edema, orthopnea, palpitations, paroxysmal nocturnal  dyspnea. Dermatological: No rash, lesions/masses Respiratory: No cough, dyspnea Urologic: No hematuria, dysuria Abdominal:   No nausea, vomiting, diarrhea, bright red blood per rectum, melena, or hematemesis Neurologic:  No visual changes, wkns, changes in mental status. All other systems reviewed and are otherwise negative except as noted above.  Physical Exam    VS:  BP 134/76 (BP Location: Left Arm, Patient Position: Sitting, Cuff Size: Normal)   Pulse 61   Wt 135 lb 9.6 oz (61.5 kg)   BMI 24.02 kg/m  , BMI Body mass index is 24.02 kg/m. GEN: Well nourished, well developed, in no acute distress. HEENT: normal. Neck: Supple,  no JVD, carotid bruits, or masses. Cardiac: RRR, no murmurs, rubs, or gallops. No clubbing, cyanosis, edema.  Radials/DP/PT 2+ and equal bilaterally.  Respiratory:  Respirations regular and unlabored, clear to auscultation bilaterally. GI: Soft, nontender, nondistended, BS + x 4. MS: no deformity or atrophy. Skin: warm and dry, no rash. Neuro:  Strength and sensation are intact. Psych: Normal affect.  Accessory Clinical Findings    Recent Labs: 04/08/2020: ALT 16; BUN 14; Creatinine, Ser 1.11; Hemoglobin 14.2; Platelets 128; Potassium 5.2; Sodium 139   Recent Lipid Panel    Component Value Date/Time   CHOL 127 04/08/2020 0854   TRIG 101 04/08/2020 0854   HDL 48 04/08/2020 0854   CHOLHDL 2.6 04/08/2020 0854   CHOLHDL 3 04/23/2018 0925   VLDL 32.0 04/23/2018 0925   LDLCALC 60 04/08/2020 0854    ECG personally reviewed by me today-normal sinus rhythm right axis deviation 61 bpm no ST or T wave deviation- No acute changes  EKG 04/08/2020 Sinus bradycardia first-degree AV block 50 bpm  Echocardiogram 09/02/2013  Study Conclusions   - Left ventricle: The cavity size was normal. Wall thickness  was normal. Systolic function was normal. The estimated  ejection fraction was in the range of 50% to 55%. Wall  motion was normal; there were no  regional wall motion  abnormalities. Left ventricular diastolic function  parameters were normal.  - Aortic valve: Structurally normal valve. Trileaflet. No  regurgitation.  - Mitral valve: Mild regurgitation.  - Left atrium: The atrium was normal in size.  - Right ventricle: The cavity size was mildly dilated.  Systolic function was normal.  - Right atrium: The atrium was normal in size.  - Atrial septum: No defect or patent foramen ovale was  identified.  - Tricuspid valve: Trivial regurgitation.  - Pulmonary arteries: PA peak pressure: 33mm Hg (S).  - Pericardium, extracardiac: There was no pericardial  effusion.  Assessment & Plan   1.  Coronary artery disease- no chest pain today.  Status post CABG 2009. Continue aspirin, simvastatin Heart healthy low-sodium diet-salty 6 given Increase physical activity as tolerated  Essential hypertension-BP today 134/76.  Well-controlled at home. Continue to monitor Heart healthy low-sodium diet-salty 6 given Increase physical activity as tolerated Order BMP  Hyperlipidemia-04/08/2020: Cholesterol, Total 127; HDL 48; LDL Chol Calc (NIH) 60; Triglycerides 101 Continue aspirin, simvastatin Heart healthy low-sodium high-fiber diet Increase physical activity as tolerated Follows with PCP  Preoperative cardiac evaluation- ventral hernia and left inguinal hernia repair, Dr. Alwyn Pea fax [phone]  Primary Cardiologist: Kirk Ruths, MD  Chart reviewed as part of pre-operative protocol coverage. Given past medical history and time since last visit, based on ACC/AHA guidelines, JAZ MALLICK would be at acceptable risk for the planned procedure without further cardiovascular testing.   His RCRI is a class III risk, 6.6% risk of major cardiac event.  He is able to complete greater than 4 METS of physical activity.  Patient was advised that if he develops new symptoms prior to surgery to contact our office to arrange a  follow-up appointment.  He verbalized understanding.  I will route this recommendation to the requesting party via Epic fax function and remove from pre-op pool.  Please call with questions.    Disposition: Follow-up with Dr. Stanford Breed or me in 12 months.   Jossie Ng. Yashas Camilli NP-C    12/29/2020, 3:42 PM [facility] [address] Office [phone] Fax [phone]  Notice: This dictation was prepared  with Dragon dictation along with smaller phrase technology. Any transcriptional errors that result from this process are unintentional and may not be corrected upon review.  I spent 15 minutes examining this patient, reviewing medications, and using patient centered shared decision making involving her cardiac care.  Prior to her visit I spent greater than 20 minutes reviewing her past medical history,  medications, and prior cardiac tests.

## 2020-12-29 ENCOUNTER — Other Ambulatory Visit: Payer: Self-pay

## 2020-12-29 ENCOUNTER — Ambulatory Visit (INDEPENDENT_AMBULATORY_CARE_PROVIDER_SITE_OTHER): Payer: Medicare Other | Admitting: General Practice

## 2020-12-29 ENCOUNTER — Encounter: Payer: Self-pay | Admitting: General Practice

## 2020-12-29 VITALS — BP 134/76 | HR 61 | Wt 135.6 lb

## 2020-12-29 DIAGNOSIS — I2510 Atherosclerotic heart disease of native coronary artery without angina pectoris: Secondary | ICD-10-CM | POA: Diagnosis not present

## 2020-12-29 DIAGNOSIS — E7800 Pure hypercholesterolemia, unspecified: Secondary | ICD-10-CM

## 2020-12-29 DIAGNOSIS — Z01810 Encounter for preprocedural cardiovascular examination: Secondary | ICD-10-CM

## 2020-12-29 DIAGNOSIS — I10 Essential (primary) hypertension: Secondary | ICD-10-CM | POA: Diagnosis not present

## 2020-12-29 NOTE — Patient Instructions (Signed)
Medication Instructions:  The current medical regimen is effective;  continue present plan and medications as directed. Please refer to the Current Medication list given to you today.  *If you need a refill on your cardiac medications before your next appointment, please call your pharmacy*  Lab Work:   Testing/Procedures:  NONE    NONE  Special Instructions CLEARED FOR UPCOMING SURGERY  PLEASE READ AND FOLLOW SALTY 6-ATTACHED-1,800mg  daily  Follow-Up: Your next appointment:  12 month(s) In Person with You may see Kirk Ruths, MD , OR IF UNAVAILABLE JESSE CLEAVER, FNP-C or one of the following Advanced Practice Providers on your designated Care Team:  Kerin Ransom, PA-C  Sande Rives, [place]  Please call our office 2 months in advance to schedule this appointment   At [facility], you and your health needs are our priority.  As part of our continuing mission to provide you with exceptional heart care, we have created designated Provider Care Teams.  These Care Teams include your primary Cardiologist (physician) and Advanced Practice Providers (APPs -  Physician Assistants and Nurse Practitioners) who all work together to provide you with the care you need, when you need it.            6 SALTY THINGS TO AVOID     1,800MG  DAILY

## 2021-01-03 ENCOUNTER — Encounter (HOSPITAL_COMMUNITY): Payer: Self-pay

## 2021-01-03 NOTE — Progress Notes (Addendum)
PCP - Cathlean Cower, MD Cardiologist - Clearance Kirk Ruths, MD lov 12-29-20 epic  PPM/ICD -  Device Orders -  Rep Notified -   Chest x-ray -  EKG - 12-29-20 epic Stress Test -  ECHO - 2014 Cardiac Cath -   Sleep Study -  CPAP -   Fasting Blood Sugar -  Checks Blood Sugar _____ times a day  Blood Thinner Instructions: Aspirin Instructions:81 mg   ERAS Protcol - PRE-SURGERY Ensure  COVID TEST- 01-08-21 Activity--plays golf walks the course with no SOB  Anesthesia review: cabg 2009,hx  a fib post MI,  Patient denies shortness of breath, fever, cough and chest pain at PAT appointment  none   All instructions explained to the patient, with a verbal understanding of the material. Patient agrees to go over the instructions while at home for a better understanding. Patient also instructed to self quarantine after being tested for COVID-19. The opportunity to ask questions was provided.

## 2021-01-03 NOTE — Patient Instructions (Addendum)
DUE TO COVID-19 ONLY ONE VISITOR IS ALLOWED TO COME WITH YOU AND STAY IN THE WAITING ROOM ONLY DURING PRE OP AND PROCEDURE DAY OF SURGERY. THE 1 VISITOR  MAY VISIT WITH YOU AFTER SURGERY IN YOUR PRIVATE ROOM DURING VISITING HOURS ONLY!  YOU NEED TO HAVE A COVID 19 TEST ON___1-29-22____ @_______ , THIS TEST MUST BE DONE BEFORE SURGERY,  COVID TESTING SITE [address] [place] [address], IT IS ON THE RIGHT GOING OUT [address] APPROXIMATELY  2 MINUTES PAST [employer] ON THE RIGHT. ONCE YOUR COVID TEST IS COMPLETED,  PLEASE BEGIN THE QUARANTINE INSTRUCTIONS AS OUTLINED IN YOUR HANDOUT.                CARMEN VALLECILLO  01/03/2021   Your procedure is scheduled on: 01-12-21   Report to [facility] Main  Entrance   Report to admitting at      0800 AM     Call this number if you have problems the morning of surgery [phone]    Remember:NO SOLID FOOD AFTER MIDNIGHT THE NIGHT PRIOR TO SURGERY. NOTHING BY MOUTH EXCEPT CLEAR LIQUIDS UNTIL     0700 am . PLEASE FINISH ENSURE DRINK PER SURGEON ORDER  WHICH NEEDS TO BE COMPLETED AT     0700 am then nothing by mouth.    CLEAR LIQUID DIET   Foods Allowed                                                                         Black Coffee and tea, regular and decaf                                         Plain Jell-O any favor except red or purple                                           Fruit ices (not with fruit pulp)                                                                 Iced Popsicles                                                           Carbonated beverages, regular and diet                                               Cranberry, grape and apple juices Sports drinks like Gatorade Lightly seasoned  clear broth or consume(fat free) Sugar, honey syrup  __________    BRUSH YOUR TEETH MORNING OF SURGERY AND RINSE YOUR MOUTH OUT, NO CHEWING GUM CANDY OR MINTS.     Take these medicines the  morning of surgery with A SIP OF WATER: NONE                                 You may not have any metal on your body including hair pins and              piercings  Do not wear jewelry,  lotions, powders or perfumes, deodorant              Men may shave face and neck.   Do not bring valuables to the hospital. [place].  Contacts, dentures or bridgework may not be worn into surgery.       Patients discharged the day of surgery will not be allowed to drive home. IF YOU ARE HAVING SURGERY AND GOING HOME THE SAME DAY, YOU MUST HAVE AN ADULT TO DRIVE YOU HOME AND BE WITH YOU FOR 24 HOURS. YOU MAY GO HOME BY TAXI OR UBER OR ORTHERWISE, BUT AN ADULT MUST ACCOMPANY YOU HOME AND STAY WITH YOU FOR 24 HOURS.  Name and phone number of your driver:  Special Instructions: N/A              Please read over the following fact sheets you were given: _____________________________________________________________________             [facility] - Preparing for Surgery Before surgery, you can play an important role.  Because skin is not sterile, your skin needs to be as free of germs as possible.  You can reduce the number of germs on your skin by washing with CHG (chlorahexidine gluconate) soap before surgery.  CHG is an antiseptic cleaner which kills germs and bonds with the skin to continue killing germs even after washing. Please DO NOT use if you have an allergy to CHG or antibacterial soaps.  If your skin becomes reddened/irritated stop using the CHG and inform your nurse when you arrive at Short Stay. Do not shave (including legs and underarms) for at least 48 hours prior to the first CHG shower.  You may shave your face/neck. Please follow these instructions carefully:  1.  Shower with CHG Soap the night before surgery and the  morning of Surgery.  2.  If you choose to wash your hair, wash your hair first as usual with your  normal  shampoo.  3.   After you shampoo, rinse your hair and body thoroughly to remove the  shampoo.                           4.  Use CHG as you would any other liquid soap.  You can apply chg directly  to the skin and wash                       Gently with a scrungie or clean washcloth.  5.  Apply the CHG Soap to your body ONLY FROM THE NECK DOWN.   Do not use on face/ open  Wound or open sores. Avoid contact with eyes, ears mouth and genitals (private parts).                       Wash face,  Genitals (private parts) with your normal soap.             6.  Wash thoroughly, paying special attention to the area where your surgery  will be performed.  7.  Thoroughly rinse your body with warm water from the neck down.  8.  DO NOT shower/wash with your normal soap after using and rinsing off  the CHG Soap.                9.  Pat yourself dry with a clean towel.            10.  Wear clean pajamas.            11.  Place clean sheets on your bed the night of your first shower and do not  sleep with pets. Day of Surgery : Do not apply any lotions/deodorants the morning of surgery.  Please wear clean clothes to the hospital/surgery center.  FAILURE TO FOLLOW THESE INSTRUCTIONS MAY RESULT IN THE CANCELLATION OF YOUR SURGERY PATIENT SIGNATURE_________________________________  NURSE SIGNATURE__________________________________  ________________________________________________________________________

## 2021-01-04 ENCOUNTER — Other Ambulatory Visit: Payer: Self-pay

## 2021-01-04 ENCOUNTER — Encounter (HOSPITAL_COMMUNITY): Payer: Self-pay

## 2021-01-04 ENCOUNTER — Encounter (HOSPITAL_COMMUNITY)
Admission: RE | Admit: 2021-01-04 | Discharge: 2021-01-04 | Disposition: A | Discharge status: Home / Self Care | Payer: Medicare Other | Source: Ambulatory Visit | Attending: Surgery | Admitting: Surgery

## 2021-01-04 DIAGNOSIS — Z01812 Encounter for preprocedural laboratory examination: Secondary | ICD-10-CM | POA: Insufficient documentation

## 2021-01-04 HISTORY — DX: Acute myocardial infarction, unspecified: I21.9

## 2021-01-04 HISTORY — DX: Personal history of urinary calculi: Z87.442

## 2021-01-04 LAB — BASIC METABOLIC PANEL
Anion gap: 7 (ref 5–15)
BUN: 13 mg/dL (ref 8–23)
CO2: 32 mmol/L (ref 22–32)
Calcium: 9.4 mg/dL (ref 8.9–10.3)
Chloride: 101 mmol/L (ref 98–111)
Creatinine, Ser: 1 mg/dL (ref 0.61–1.24)
GFR, Estimated: >60 mL/min (ref >60)
Glucose, Bld: 120 mg/dL — ABNORMAL HIGH (ref 70–99)
Potassium: 4 mmol/L (ref 3.5–5.1)
Sodium: 140 mmol/L (ref 135–145)

## 2021-01-04 LAB — CBC
HCT: 43.7 % (ref 39.0–52.0)
Hemoglobin: 14.6 g/dL (ref 13.0–17.0)
MCH: 31 pg (ref 26.0–34.0)
MCHC: 33.4 g/dL (ref 30.0–36.0)
MCV: 92.8 fL (ref 80.0–100.0)
Platelets: 131 10*3/uL — ABNORMAL LOW (ref 150–400)
RBC: 4.71 MIL/uL (ref 4.22–5.81)
RDW: 11.9 % (ref 11.5–15.5)
WBC: 6.5 10*3/uL (ref 4.0–10.5)
nRBC: 0 % (ref 0.0–0.2)

## 2021-01-05 NOTE — Anesthesia Preprocedure Evaluation (Addendum)
Anesthesia Evaluation  Patient identified by MRN, date of birth, ID band Patient awake    Reviewed: Allergy & Precautions, NPO status , Patient's Chart, lab work & pertinent test results  History of Anesthesia Complications Negative for: history of anesthetic complications  Airway Mallampati: II  TM Distance: >3 FB Neck ROM: Full    Dental  (+) Dental Advisory Given   Pulmonary neg pulmonary ROS,    Pulmonary exam normal        Cardiovascular + CAD, + Past MI and + CABG  Normal cardiovascular exam+ dysrhythmias Atrial Fibrillation      Neuro/Psych  Headaches, PSYCHIATRIC DISORDERS Anxiety Depression    GI/Hepatic negative GI ROS, Neg liver ROS,   Endo/Other  negative endocrine ROS  Renal/GU negative Renal ROS     Musculoskeletal  (+) Arthritis ,   Abdominal   Peds  Hematology  Thrombocytopenia, Plt 133k    Anesthesia Other Findings Covid test negative   Reproductive/Obstetrics                            Anesthesia Physical Anesthesia Plan  ASA: III  Anesthesia Plan: General   Post-op Pain Management:    Induction: Intravenous  PONV Risk Score and Plan: 2 and Treatment may vary due to age or medical condition, Ondansetron and Propofol infusion  Airway Management Planned: Oral ETT  Additional Equipment: None  Intra-op Plan:   Post-operative Plan: Extubation in OR  Informed Consent: I have reviewed the patients History and Physical, chart, labs and discussed the procedure including the risks, benefits and alternatives for the proposed anesthesia with the patient or authorized representative who has indicated his/her understanding and acceptance.     Dental advisory given  Plan Discussed with: CRNA and Anesthesiologist  Anesthesia Plan Comments:       Anesthesia Quick Evaluation

## 2021-01-05 NOTE — Progress Notes (Signed)
Anesthesia Chart Review   Case: [id number] Date/Time: 01/12/21 0945   Procedure: LAPAROSCOPIC REPAIR OF LEFT FLANK AND INGUINAL HERNIAS (N/A )   Anesthesia type: General   Pre-op diagnosis: ventral incisional recurrent and incarcerated abdominal wall hernia, left inguinal hernia   Location: [facility] ROOM 01 / [facility]   Surgeons: Michael Boston, MD      DISCUSSION:79 y.o. never smoker with h/o atrial fibrillation, CAD (CABG 2009), recurrent ventral incisional hernia, incarcerated abdominal wall hernia, left inguinal hernia scheduled for above procedure 01/12/2021 with Dr. Michael Boston.   Per cardiology note 12/29/20, "Chart reviewed as part of pre-operative protocol coverage. Given past medical history and time since last visit, based on ACC/AHA guidelines, [name] would be at acceptable risk for the planned procedure without further cardiovascular testing.  His RCRI is a class III risk, 6.6% risk of major cardiac event.  He is able to complete greater than 4 METS of physical activity."  Anticipate pt can proceed with planned procedure barring acute status change.   VS: BP 122/68   Pulse 65   Temp 36.9 C (Oral)   Resp 16   Ht 5\' 3"  (1.6 m)   Wt 61.2 kg   SpO2 99%   BMI 23.91 kg/m   PROVIDERS: Biagio Borg, MD is PCP   Kirk Ruths, MD is Cardiologist  LABS: Labs reviewed: Acceptable for surgery. (all labs ordered are listed, but only abnormal results are displayed)  Labs Reviewed  CBC - Abnormal; Notable for the following components:      Result Value   Platelets 131 (*)    All other components within normal limits  BASIC METABOLIC PANEL - Abnormal; Notable for the following components:   Glucose, Bld 120 (*)    All other components within normal limits     IMAGES:   EKG: 12/29/2020 Rate 61 bpm  NSR Rightward axis  CV: Echo 09/02/2013 Study Conclusions   - Left ventricle: The cavity size was normal. Wall thickness  was normal. Systolic function was normal.  The estimated  ejection fraction was in the range of 50% to 55%. Wall  motion was normal; there were no regional wall motion  abnormalities. Left ventricular diastolic function  parameters were normal.  - Aortic valve: Structurally normal valve. Trileaflet. No  regurgitation.  - Mitral valve: Mild regurgitation.  - Left atrium: The atrium was normal in size.  - Right ventricle: The cavity size was mildly dilated.  Systolic function was normal.  - Right atrium: The atrium was normal in size.  - Atrial septum: No defect or patent foramen ovale was  identified.  - Tricuspid valve: Trivial regurgitation.  - Pulmonary arteries: PA peak pressure: 58mm Hg (S).  - Pericardium, extracardiac: There was no pericardial  effusion.   Past Medical History:  Diagnosis Date  . Atrial fibrillation ([place])    post MI-amiodarone stopped 07/2008  . Benign prostatic hypertrophy   . CAD (coronary artery disease) 07/2008   2 BMS placed for MI, August, 2009  . Carotid artery disease without cerebral infarction ([place])   . CORONARY ARTERY BYPASS GRAFT, HX OF 07/13/2009   August, 2009, Dr. Cyndia Bent  . Ejection fraction    .  Marland Kitchen History of kidney stones   . History of nephrolithiasis   . Hx of CABG   . Hx of colonoscopy   . Hyperlipidemia   . HYPERLIPIDEMIA 02/09/2009   Qualifier: Diagnosis of  By: Jenny Reichmann MD, Hunt Oris   . Increased prostate  specific antigen (PSA) velocity   . Myocardial infarction ([place])   . NEPHROLITHIASIS, HX OF 02/09/2009   Qualifier: Diagnosis of  By: Jenny Reichmann MD, Hunt Oris   . Prostatitis   . PSA elevation 09/06/2012  . Thrombocytopenia ([place])     Past Surgical History:  Procedure Laterality Date  . CORONARY ARTERY BYPASS GRAFT  08/05/08   x5, [place] to the LAD.  Marland Kitchen CORONARY STENT PLACEMENT  07/2008   2 BMS to RCA  . HEMORRHOID SURGERY    . HYDROCELE EXCISION    . INCISIONAL HERNIA REPAIR  04/16/2006   Left flank incisional hernia repair with mesh.  Dr Harlow Asa  . left leg broken     . PROSTATE BIOPSY  2007   neg  . ROTATOR CUFF REPAIR     bilateral; Dr. Noemi Chapel    MEDICATIONS: . aspirin 81 MG EC tablet  . Cholecalciferol (VITAMIN D) 50 MCG (2000 UT) CAPS  . Multiple Vitamin (MULTIVITAMIN) tablet  . simvastatin (ZOCOR) 40 MG tablet   No current facility-administered medications for this encounter.     Konrad Felix, PA-C [facility]Surgical Testing [phone]

## 2021-01-08 ENCOUNTER — Other Ambulatory Visit (HOSPITAL_COMMUNITY)
Admission: RE | Admit: 2021-01-08 | Discharge: 2021-01-08 | Disposition: A | Discharge status: Home / Self Care | Payer: Medicare Other | Source: Ambulatory Visit | Attending: Surgery | Admitting: Surgery

## 2021-01-08 DIAGNOSIS — Z20822 Contact with and (suspected) exposure to covid-19: Secondary | ICD-10-CM | POA: Insufficient documentation

## 2021-01-08 DIAGNOSIS — Z01812 Encounter for preprocedural laboratory examination: Secondary | ICD-10-CM | POA: Diagnosis not present

## 2021-01-08 LAB — SARS CORONAVIRUS 2 (TAT 6-24 HRS): SARS Coronavirus 2: NEGATIVE

## 2021-02-01 NOTE — Progress Notes (Signed)
DUE TO COVID-19 ONLY ONE VISITOR IS ALLOWED TO COME WITH YOU AND STAY IN THE WAITING ROOM ONLY DURING PRE OP AND PROCEDURE DAY OF SURGERY. THE 1 VISITOR  MAY VISIT WITH YOU AFTER SURGERY IN YOUR PRIVATE ROOM DURING VISITING HOURS ONLY!  YOU NEED TO HAVE A COVID 19 TEST ON__3/12/2020 _____ @_______ , THIS TEST MUST BE DONE BEFORE SURGERY,  COVID TESTING SITE [address] [place] [address], IT IS ON THE RIGHT GOING OUT [address] APPROXIMATELY  2 MINUTES PAST [employer] ON THE RIGHT. ONCE YOUR COVID TEST IS COMPLETED,  PLEASE BEGIN THE QUARANTINE INSTRUCTIONS AS OUTLINED IN YOUR HANDOUT.                ABASS MISENER  02/01/2021   Your procedure is scheduled on: 02/11/2021    Report to [facility] Main  Entrance   Report to admitting at     0530 AM     Call this number if you have problems the morning of surgery [phone]    REMEMBER: NO  SOLID FOOD CANDY OR GUM AFTER MIDNIGHT. CLEAR LIQUIDS UNTIL   0430am       . NOTHING BY MOUTH EXCEPT CLEAR LIQUIDS UNTIL    . PLEASE FINISH ENSURE DRINK PER SURGEON ORDER  WHICH NEEDS TO BE COMPLETED AT     0430am  .      CLEAR LIQUID DIET   Foods Allowed                                                                    Coffee and tea, regular and decaf                            Fruit ices (not with fruit pulp)                                      Iced Popsicles                                    Carbonated beverages, regular and diet                                    Cranberry, grape and apple juices Sports drinks like Gatorade Lightly seasoned clear broth or consume(fat free) Sugar, honey syrup ___________________________________________________________________      BRUSH YOUR TEETH MORNING OF SURGERY AND RINSE YOUR MOUTH OUT, NO CHEWING GUM CANDY OR MINTS.     Take these medicines the morning of surgery with A SIP OF WATER: none   DO NOT TAKE ANY DIABETIC MEDICATIONS DAY OF YOUR SURGERY                                You may not have any metal on your body including hair pins and              piercings  Do  not wear jewelry, make-up, lotions, powders or perfumes, deodorant             Do not wear nail polish on your fingernails.  Do not shave  48 hours prior to surgery.              Men may shave face and neck.   Do not bring valuables to the hospital. [place].  Contacts, dentures or bridgework may not be worn into surgery.  Leave suitcase in the car. After surgery it may be brought to your room.     Patients discharged the day of surgery will not be allowed to drive home. IF YOU ARE HAVING SURGERY AND GOING HOME THE SAME DAY, YOU MUST HAVE AN ADULT TO DRIVE YOU HOME AND BE WITH YOU FOR 24 HOURS. YOU MAY GO HOME BY TAXI OR UBER OR ORTHERWISE, BUT AN ADULT MUST ACCOMPANY YOU HOME AND STAY WITH YOU FOR 24 HOURS.  Name and phone number of your driver:  Special Instructions: N/A              Please read over the following fact sheets you were given: _____________________________________________________________________  [facility] - Preparing for Surgery Before surgery, you can play an important role.  Because skin is not sterile, your skin needs to be as free of germs as possible.  You can reduce the number of germs on your skin by washing with CHG (chlorahexidine gluconate) soap before surgery.  CHG is an antiseptic cleaner which kills germs and bonds with the skin to continue killing germs even after washing. Please DO NOT use if you have an allergy to CHG or antibacterial soaps.  If your skin becomes reddened/irritated stop using the CHG and inform your nurse when you arrive at Short Stay. Do not shave (including legs and underarms) for at least 48 hours prior to the first CHG shower.  You may shave your face/neck. Please follow these instructions carefully:  1.  Shower with CHG Soap the night before surgery and the  morning of  Surgery.  2.  If you choose to wash your hair, wash your hair first as usual with your  normal  shampoo.  3.  After you shampoo, rinse your hair and body thoroughly to remove the  shampoo.                           4.  Use CHG as you would any other liquid soap.  You can apply chg directly  to the skin and wash                       Gently with a scrungie or clean washcloth.  5.  Apply the CHG Soap to your body ONLY FROM THE NECK DOWN.   Do not use on face/ open                           Wound or open sores. Avoid contact with eyes, ears mouth and genitals (private parts).                       Wash face,  Genitals (private parts) with your normal soap.             6.  Wash thoroughly,  paying special attention to the area where your surgery  will be performed.  7.  Thoroughly rinse your body with warm water from the neck down.  8.  DO NOT shower/wash with your normal soap after using and rinsing off  the CHG Soap.                9.  Pat yourself dry with a clean towel.            10.  Wear clean pajamas.            11.  Place clean sheets on your bed the night of your first shower and do not  sleep with pets. Day of Surgery : Do not apply any lotions/deodorants the morning of surgery.  Please wear clean clothes to the hospital/surgery center.  FAILURE TO FOLLOW THESE INSTRUCTIONS MAY RESULT IN THE CANCELLATION OF YOUR SURGERY PATIENT SIGNATURE_________________________________  NURSE SIGNATURE__________________________________  ________________________________________________________________________

## 2021-02-04 ENCOUNTER — Encounter (HOSPITAL_COMMUNITY): Payer: Self-pay

## 2021-02-04 ENCOUNTER — Encounter (HOSPITAL_COMMUNITY)
Admission: RE | Admit: 2021-02-04 | Discharge: 2021-02-04 | Disposition: A | Discharge status: Home / Self Care | Payer: Medicare Other | Source: Ambulatory Visit | Attending: Surgery | Admitting: Surgery

## 2021-02-04 ENCOUNTER — Other Ambulatory Visit: Payer: Self-pay

## 2021-02-04 DIAGNOSIS — Z01812 Encounter for preprocedural laboratory examination: Secondary | ICD-10-CM | POA: Diagnosis not present

## 2021-02-04 HISTORY — DX: Unspecified osteoarthritis, unspecified site: M19.90

## 2021-02-04 LAB — BASIC METABOLIC PANEL
Anion gap: 8 (ref 5–15)
BUN: 16 mg/dL (ref 8–23)
CO2: 29 mmol/L (ref 22–32)
Calcium: 9.4 mg/dL (ref 8.9–10.3)
Chloride: 102 mmol/L (ref 98–111)
Creatinine, Ser: 0.97 mg/dL (ref 0.61–1.24)
GFR, Estimated: >60 mL/min (ref >60)
Glucose, Bld: 151 mg/dL — ABNORMAL HIGH (ref 70–99)
Potassium: 4.3 mmol/L (ref 3.5–5.1)
Sodium: 139 mmol/L (ref 135–145)

## 2021-02-04 LAB — CBC
HCT: 44.8 % (ref 39.0–52.0)
Hemoglobin: 14.7 g/dL (ref 13.0–17.0)
MCH: 30.2 pg (ref 26.0–34.0)
MCHC: 32.8 g/dL (ref 30.0–36.0)
MCV: 92.2 fL (ref 80.0–100.0)
Platelets: 133 10*3/uL — ABNORMAL LOW (ref 150–400)
RBC: 4.86 MIL/uL (ref 4.22–5.81)
RDW: 11.9 % (ref 11.5–15.5)
WBC: 6.7 10*3/uL (ref 4.0–10.5)
nRBC: 0 % (ref 0.0–0.2)

## 2021-02-04 NOTE — Progress Notes (Signed)
Anesthesia Review:  PCP: DR  Cathlean Cower  LOV 08/14/2019  Cardiologist : DR Kirk Ruths 12/29/2020- LOV  Chest x-ray : EKG :12/29/20  Echo : 2014 Stress test: Cardiac Cath :  Activity level: can do a flight of stairs without difficulty  Plays golf several times per week per pt    Sleep Study/ CPAP : no  Fasting Blood Sugar :      / Checks Blood Sugar -- times a day:   Blood Thinner/ Instructions /Last Dose: ASA / Instructions/ Last Dose :  81 mg Aspirin

## 2021-02-08 ENCOUNTER — Other Ambulatory Visit (HOSPITAL_COMMUNITY)
Admission: RE | Admit: 2021-02-08 | Discharge: 2021-02-08 | Disposition: A | Discharge status: Home / Self Care | Payer: Medicare Other | Source: Ambulatory Visit | Attending: Surgery | Admitting: Surgery

## 2021-02-08 DIAGNOSIS — Z20822 Contact with and (suspected) exposure to covid-19: Secondary | ICD-10-CM | POA: Insufficient documentation

## 2021-02-08 DIAGNOSIS — Z01812 Encounter for preprocedural laboratory examination: Secondary | ICD-10-CM | POA: Insufficient documentation

## 2021-02-09 LAB — SARS CORONAVIRUS 2 (TAT 6-24 HRS): SARS Coronavirus 2: NEGATIVE

## 2021-02-10 MED ORDER — BUPIVACAINE LIPOSOME 1.3 % IJ SUSP
20.0000 mL | INTRAMUSCULAR | Status: DC
  Filled 2021-02-10: qty 20

## 2021-02-11 ENCOUNTER — Other Ambulatory Visit: Payer: Self-pay

## 2021-02-11 ENCOUNTER — Ambulatory Visit (HOSPITAL_COMMUNITY): Payer: Medicare Other | Admitting: Physician Assistant

## 2021-02-11 ENCOUNTER — Ambulatory Visit (HOSPITAL_COMMUNITY): Payer: Medicare Other | Admitting: Anesthesiology

## 2021-02-11 ENCOUNTER — Encounter (HOSPITAL_COMMUNITY)
Admission: RE | Disposition: A | Discharge status: Home / Self Care | Payer: Self-pay | Source: Home / Self Care | Attending: Surgery

## 2021-02-11 ENCOUNTER — Encounter (HOSPITAL_COMMUNITY): Payer: Self-pay | Admitting: Surgery

## 2021-02-11 ENCOUNTER — Observation Stay (HOSPITAL_COMMUNITY)
Admission: RE | Admit: 2021-02-11 | Discharge: 2021-02-12 | Disposition: A | Discharge status: Home / Self Care | Payer: Medicare Other | Attending: Surgery | Admitting: Surgery

## 2021-02-11 DIAGNOSIS — K432 Incisional hernia without obstruction or gangrene: Secondary | ICD-10-CM | POA: Diagnosis not present

## 2021-02-11 DIAGNOSIS — K458 Other specified abdominal hernia without obstruction or gangrene: Secondary | ICD-10-CM

## 2021-02-11 DIAGNOSIS — F32A Depression, unspecified: Secondary | ICD-10-CM | POA: Diagnosis present

## 2021-02-11 DIAGNOSIS — F329 Major depressive disorder, single episode, unspecified: Secondary | ICD-10-CM | POA: Insufficient documentation

## 2021-02-11 DIAGNOSIS — D696 Thrombocytopenia, unspecified: Secondary | ICD-10-CM | POA: Diagnosis not present

## 2021-02-11 DIAGNOSIS — F418 Other specified anxiety disorders: Secondary | ICD-10-CM | POA: Diagnosis not present

## 2021-02-11 DIAGNOSIS — F419 Anxiety disorder, unspecified: Secondary | ICD-10-CM | POA: Insufficient documentation

## 2021-02-11 DIAGNOSIS — I4891 Unspecified atrial fibrillation: Secondary | ICD-10-CM | POA: Diagnosis present

## 2021-02-11 DIAGNOSIS — F411 Generalized anxiety disorder: Secondary | ICD-10-CM | POA: Diagnosis present

## 2021-02-11 DIAGNOSIS — K5730 Diverticulosis of large intestine without perforation or abscess without bleeding: Secondary | ICD-10-CM | POA: Insufficient documentation

## 2021-02-11 DIAGNOSIS — Z1211 Encounter for screening for malignant neoplasm of colon: Secondary | ICD-10-CM | POA: Insufficient documentation

## 2021-02-11 DIAGNOSIS — Z951 Presence of aortocoronary bypass graft: Secondary | ICD-10-CM | POA: Diagnosis not present

## 2021-02-11 DIAGNOSIS — R1904 Left lower quadrant abdominal swelling, mass and lump: Secondary | ICD-10-CM | POA: Diagnosis present

## 2021-02-11 DIAGNOSIS — K4090 Unilateral inguinal hernia, without obstruction or gangrene, not specified as recurrent: Secondary | ICD-10-CM | POA: Diagnosis not present

## 2021-02-11 DIAGNOSIS — Z860100 Personal history of colon polyps, unspecified: Secondary | ICD-10-CM | POA: Insufficient documentation

## 2021-02-11 HISTORY — PX: INGUINAL HERNIA REPAIR: SHX194

## 2021-02-11 LAB — CBC
HCT: 41.6 % (ref 39.0–52.0)
Hemoglobin: 13 g/dL (ref 13.0–17.0)
MCH: 30.4 pg (ref 26.0–34.0)
MCHC: 31.3 g/dL (ref 30.0–36.0)
MCV: 97.4 fL (ref 80.0–100.0)
Platelets: 102 10*3/uL — ABNORMAL LOW (ref 150–400)
RBC: 4.27 MIL/uL (ref 4.22–5.81)
RDW: 11.9 % (ref 11.5–15.5)
WBC: 17.1 10*3/uL — ABNORMAL HIGH (ref 4.0–10.5)
nRBC: 0 % (ref 0.0–0.2)

## 2021-02-11 LAB — BASIC METABOLIC PANEL
Anion gap: 10 (ref 5–15)
BUN: 16 mg/dL (ref 8–23)
CO2: 22 mmol/L (ref 22–32)
Calcium: 8.4 mg/dL — ABNORMAL LOW (ref 8.9–10.3)
Chloride: 104 mmol/L (ref 98–111)
Creatinine, Ser: 1.08 mg/dL (ref 0.61–1.24)
GFR, Estimated: >60 mL/min (ref >60)
Glucose, Bld: 169 mg/dL — ABNORMAL HIGH (ref 70–99)
Potassium: 3.8 mmol/L (ref 3.5–5.1)
Sodium: 136 mmol/L (ref 135–145)

## 2021-02-11 SURGERY — REPAIR, HERNIA, INGUINAL, LAPAROSCOPIC
Anesthesia: General

## 2021-02-11 MED ORDER — SODIUM CHLORIDE 0.9 % IV SOLN: Freq: Three times a day (TID) | INTRAVENOUS | Status: DC | PRN

## 2021-02-11 MED ORDER — BUPIVACAINE-EPINEPHRINE 0.25% -1:200000 IJ SOLN
INTRAMUSCULAR | Status: DC | PRN
  Administered 2021-02-11: 60 mL

## 2021-02-11 MED ORDER — ADULT MULTIVITAMIN W/MINERALS CH
1.0000 | ORAL_TABLET | Freq: Every day | ORAL | Status: DC
  Administered 2021-02-12: 1 via ORAL
  Filled 2021-02-11 (×2): qty 1

## 2021-02-11 MED ORDER — DIPHENHYDRAMINE HCL 50 MG/ML IJ SOLN
12.5000 mg | Freq: Four times a day (QID) | INTRAMUSCULAR | Status: DC | PRN
Start: 2021-02-11 — End: 2021-02-12

## 2021-02-11 MED ORDER — LIDOCAINE 2% (20 MG/ML) 5 ML SYRINGE
INTRAMUSCULAR | Status: DC | PRN
  Administered 2021-02-11: 1.5 mg/kg/h via INTRAVENOUS

## 2021-02-11 MED ORDER — SODIUM CHLORIDE 0.9 % IV SOLN: 250.0000 mL | INTRAVENOUS | Status: DC | PRN

## 2021-02-11 MED ORDER — BISACODYL 10 MG RE SUPP: 10.0000 mg | Freq: Every day | RECTAL | Status: DC | PRN

## 2021-02-11 MED ORDER — ENOXAPARIN SODIUM 40 MG/0.4ML ~~LOC~~ SOLN
40.0000 mg | SUBCUTANEOUS | Status: DC
  Administered 2021-02-12: 40 mg via SUBCUTANEOUS
  Filled 2021-02-11: qty 0.4

## 2021-02-11 MED ORDER — CHLORHEXIDINE GLUCONATE CLOTH 2 % EX PADS: 6.0000 | MEDICATED_PAD | Freq: Once | CUTANEOUS | Status: DC

## 2021-02-11 MED ORDER — ACETAMINOPHEN 500 MG PO TABS
1000.0000 mg | ORAL_TABLET | Freq: Four times a day (QID) | ORAL | Status: DC
  Administered 2021-02-11 – 2021-02-12 (×4): 1000 mg via ORAL
  Filled 2021-02-11 (×4): qty 2

## 2021-02-11 MED ORDER — METOPROLOL TARTRATE 5 MG/5ML IV SOLN
5.0000 mg | Freq: Four times a day (QID) | INTRAVENOUS | Status: DC | PRN
  Filled 2021-02-11: qty 5

## 2021-02-11 MED ORDER — LIDOCAINE 2% (20 MG/ML) 5 ML SYRINGE
INTRAMUSCULAR | Status: DC | PRN
  Administered 2021-02-11: 60 mg via INTRAVENOUS

## 2021-02-11 MED ORDER — KETOROLAC TROMETHAMINE 0.5 % OP SOLN
1.0000 [drp] | Freq: Three times a day (TID) | OPHTHALMIC | Status: DC | PRN
  Filled 2021-02-11: qty 5

## 2021-02-11 MED ORDER — MAGIC MOUTHWASH
15.0000 mL | Freq: Four times a day (QID) | ORAL | Status: DC | PRN
  Filled 2021-02-11: qty 15

## 2021-02-11 MED ORDER — PSYLLIUM 95 % PO PACK
1.0000 | PACK | Freq: Two times a day (BID) | ORAL | Status: DC
  Administered 2021-02-11 – 2021-02-12 (×2): 1 via ORAL
  Filled 2021-02-11 (×2): qty 1

## 2021-02-11 MED ORDER — SODIUM CHLORIDE 0.9% FLUSH: 3.0000 mL | Freq: Two times a day (BID) | INTRAVENOUS | Status: DC

## 2021-02-11 MED ORDER — ORAL CARE MOUTH RINSE: 15.0000 mL | Freq: Once | OROMUCOSAL | Status: DC

## 2021-02-11 MED ORDER — LACTATED RINGERS IV SOLN: INTRAVENOUS | Status: DC

## 2021-02-11 MED ORDER — PHENYLEPHRINE 40 MCG/ML (10ML) SYRINGE FOR IV PUSH (FOR BLOOD PRESSURE SUPPORT)
PREFILLED_SYRINGE | INTRAVENOUS | Status: DC | PRN
  Administered 2021-02-11: 80 ug via INTRAVENOUS
  Administered 2021-02-11: 120 ug via INTRAVENOUS
  Administered 2021-02-11: 80 ug via INTRAVENOUS
  Administered 2021-02-11 (×2): 120 ug via INTRAVENOUS

## 2021-02-11 MED ORDER — OXYCODONE HCL 5 MG PO TABS: 5.0000 mg | ORAL_TABLET | Freq: Four times a day (QID) | ORAL | 0 refills | Status: DC | PRN

## 2021-02-11 MED ORDER — TRAMADOL HCL 50 MG PO TABS
50.0000 mg | ORAL_TABLET | Freq: Four times a day (QID) | ORAL | Status: DC | PRN
  Administered 2021-02-11: 50 mg via ORAL
  Filled 2021-02-11: qty 1

## 2021-02-11 MED ORDER — DIPHENHYDRAMINE HCL 12.5 MG/5ML PO ELIX: 12.5000 mg | ORAL_SOLUTION | Freq: Four times a day (QID) | ORAL | Status: DC | PRN

## 2021-02-11 MED ORDER — SODIUM CHLORIDE 0.9% FLUSH: 3.0000 mL | INTRAVENOUS | Status: DC | PRN

## 2021-02-11 MED ORDER — CEFAZOLIN SODIUM-DEXTROSE 2-4 GM/100ML-% IV SOLN
2.0000 g | Freq: Three times a day (TID) | INTRAVENOUS | Status: AC
  Administered 2021-02-11: 2 g via INTRAVENOUS
  Filled 2021-02-11: qty 100

## 2021-02-11 MED ORDER — PHENYLEPHRINE 40 MCG/ML (10ML) SYRINGE FOR IV PUSH (FOR BLOOD PRESSURE SUPPORT)
PREFILLED_SYRINGE | INTRAVENOUS | Status: AC
  Filled 2021-02-11: qty 10

## 2021-02-11 MED ORDER — CHLORHEXIDINE GLUCONATE 0.12 % MT SOLN
15.0000 mL | Freq: Once | OROMUCOSAL | Status: DC
  Administered 2021-02-11: 15 mL via OROMUCOSAL

## 2021-02-11 MED ORDER — ONDANSETRON HCL 4 MG/2ML IJ SOLN: 4.0000 mg | Freq: Once | INTRAMUSCULAR | Status: DC | PRN

## 2021-02-11 MED ORDER — LACTATED RINGERS IV SOLN: INTRAVENOUS | Status: AC

## 2021-02-11 MED ORDER — VITAMIN D 25 MCG (1000 UNIT) PO TABS
2000.0000 [IU] | ORAL_TABLET | Freq: Every day | ORAL | Status: DC
  Administered 2021-02-12: 2000 [IU] via ORAL
  Filled 2021-02-11: qty 2

## 2021-02-11 MED ORDER — LACTATED RINGERS IV BOLUS: 1000.0000 mL | Freq: Three times a day (TID) | INTRAVENOUS | Status: DC | PRN

## 2021-02-11 MED ORDER — FENTANYL CITRATE (PF) 250 MCG/5ML IJ SOLN
INTRAMUSCULAR | Status: AC
  Filled 2021-02-11: qty 5

## 2021-02-11 MED ORDER — SIMVASTATIN 20 MG PO TABS
20.0000 mg | ORAL_TABLET | Freq: Every day | ORAL | Status: DC
  Administered 2021-02-11: 20 mg via ORAL
  Filled 2021-02-11: qty 1

## 2021-02-11 MED ORDER — MAGNESIUM HYDROXIDE 400 MG/5ML PO SUSP
30.0000 mL | Freq: Every day | ORAL | Status: DC | PRN
  Administered 2021-02-11: 30 mL via ORAL
  Filled 2021-02-11: qty 30

## 2021-02-11 MED ORDER — SIMETHICONE 80 MG PO CHEW: 40.0000 mg | CHEWABLE_TABLET | Freq: Four times a day (QID) | ORAL | Status: DC | PRN

## 2021-02-11 MED ORDER — ENSURE PRE-SURGERY PO LIQD
296.0000 mL | Freq: Once | ORAL | Status: DC
  Filled 2021-02-11: qty 296

## 2021-02-11 MED ORDER — CHLORHEXIDINE GLUCONATE CLOTH 2 % EX PADS: 6.0000 | MEDICATED_PAD | Freq: Once | CUTANEOUS | Status: AC

## 2021-02-11 MED ORDER — PROPOFOL 10 MG/ML IV BOLUS
INTRAVENOUS | Status: DC | PRN
  Administered 2021-02-11: 130 mg via INTRAVENOUS

## 2021-02-11 MED ORDER — OXYCODONE HCL 5 MG PO TABS
5.0000 mg | ORAL_TABLET | Freq: Once | ORAL | Status: DC | PRN
Start: 2021-02-11 — End: 2021-02-11

## 2021-02-11 MED ORDER — BSS IO SOLN
15.0000 mL | Freq: Once | INTRAOCULAR | Status: AC
  Administered 2021-02-11: 15 mL
  Filled 2021-02-11: qty 15

## 2021-02-11 MED ORDER — LIP MEDEX EX OINT
1.0000 "application " | TOPICAL_OINTMENT | Freq: Two times a day (BID) | CUTANEOUS | Status: DC
  Administered 2021-02-11 – 2021-02-12 (×2): 1 via TOPICAL
  Filled 2021-02-11: qty 7

## 2021-02-11 MED ORDER — PROPOFOL 10 MG/ML IV BOLUS
INTRAVENOUS | Status: AC
  Filled 2021-02-11: qty 20

## 2021-02-11 MED ORDER — OXYCODONE HCL 5 MG PO TABS
5.0000 mg | ORAL_TABLET | ORAL | Status: DC | PRN
  Administered 2021-02-11: 10 mg via ORAL
  Administered 2021-02-12: 5 mg via ORAL
  Filled 2021-02-11: qty 2
  Filled 2021-02-11: qty 1

## 2021-02-11 MED ORDER — ROCURONIUM BROMIDE 10 MG/ML (PF) SYRINGE
PREFILLED_SYRINGE | INTRAVENOUS | Status: DC | PRN
  Administered 2021-02-11: 10 mg via INTRAVENOUS
  Administered 2021-02-11: 60 mg via INTRAVENOUS
  Administered 2021-02-11: 10 mg via INTRAVENOUS

## 2021-02-11 MED ORDER — METHOCARBAMOL 500 MG PO TABS
500.0000 mg | ORAL_TABLET | Freq: Four times a day (QID) | ORAL | Status: DC | PRN
  Administered 2021-02-11: 500 mg via ORAL
  Filled 2021-02-11: qty 1

## 2021-02-11 MED ORDER — FENTANYL CITRATE (PF) 100 MCG/2ML IJ SOLN
INTRAMUSCULAR | Status: DC | PRN
  Administered 2021-02-11: 50 ug via INTRAVENOUS
  Administered 2021-02-11: 100 ug via INTRAVENOUS

## 2021-02-11 MED ORDER — CHLORHEXIDINE GLUCONATE 0.12 % MT SOLN: 15.0000 mL | Freq: Once | OROMUCOSAL | Status: DC

## 2021-02-11 MED ORDER — OXYCODONE HCL 5 MG/5ML PO SOLN
5.0000 mg | Freq: Once | ORAL | Status: DC | PRN
Start: 2021-02-11 — End: 2021-02-11

## 2021-02-11 MED ORDER — KETOROLAC TROMETHAMINE 15 MG/ML IJ SOLN
INTRAMUSCULAR | Status: DC | PRN
  Administered 2021-02-11: 15 mg via INTRAVENOUS

## 2021-02-11 MED ORDER — CEFAZOLIN SODIUM-DEXTROSE 2-4 GM/100ML-% IV SOLN
2.0000 g | INTRAVENOUS | Status: AC
  Administered 2021-02-11: 2 g via INTRAVENOUS
  Filled 2021-02-11: qty 100

## 2021-02-11 MED ORDER — ACETAMINOPHEN 500 MG PO TABS
1000.0000 mg | ORAL_TABLET | ORAL | Status: AC
  Administered 2021-02-11: 1000 mg via ORAL
  Filled 2021-02-11: qty 2

## 2021-02-11 MED ORDER — LIDOCAINE HCL 2 % IJ SOLN
INTRAMUSCULAR | Status: AC
  Filled 2021-02-11: qty 20

## 2021-02-11 MED ORDER — BUPIVACAINE-EPINEPHRINE (PF) 0.25% -1:200000 IJ SOLN
INTRAMUSCULAR | Status: AC
  Filled 2021-02-11: qty 60

## 2021-02-11 MED ORDER — POLYMYXIN B-TRIMETHOPRIM 10000-0.1 UNIT/ML-% OP SOLN
1.0000 [drp] | Freq: Three times a day (TID) | OPHTHALMIC | Status: AC
  Administered 2021-02-11 – 2021-02-12 (×3): 1 [drp] via OPHTHALMIC
  Filled 2021-02-11: qty 10

## 2021-02-11 MED ORDER — FENTANYL CITRATE (PF) 100 MCG/2ML IJ SOLN: 25.0000 ug | INTRAMUSCULAR | Status: DC | PRN

## 2021-02-11 MED ORDER — ASPIRIN EC 81 MG PO TBEC
81.0000 mg | DELAYED_RELEASE_TABLET | Freq: Every day | ORAL | Status: DC
  Administered 2021-02-12: 81 mg via ORAL
  Filled 2021-02-11: qty 1

## 2021-02-11 MED ORDER — GABAPENTIN 100 MG PO CAPS
100.0000 mg | ORAL_CAPSULE | Freq: Three times a day (TID) | ORAL | Status: DC
  Administered 2021-02-11 – 2021-02-12 (×3): 100 mg via ORAL
  Filled 2021-02-11 (×3): qty 1

## 2021-02-11 MED ORDER — GABAPENTIN 300 MG PO CAPS
300.0000 mg | ORAL_CAPSULE | ORAL | Status: AC
  Administered 2021-02-11: 300 mg via ORAL
  Filled 2021-02-11: qty 1

## 2021-02-11 MED ORDER — SODIUM CHLORIDE 0.9 % IR SOLN
Status: DC | PRN
  Administered 2021-02-11: 1000 mL

## 2021-02-11 MED ORDER — BUPIVACAINE LIPOSOME 1.3 % IJ SUSP
INTRAMUSCULAR | Status: DC | PRN
  Administered 2021-02-11: 20 mL

## 2021-02-11 MED ORDER — ENSURE PRE-SURGERY PO LIQD: 592.0000 mL | Freq: Once | ORAL | Status: DC

## 2021-02-11 SURGICAL SUPPLY — 41 items
CABLE HIGH FREQUENCY MONO STRZ (ELECTRODE) ×2 IMPLANT
CHLORAPREP W/TINT 26 (MISCELLANEOUS) ×2 IMPLANT
CLSR STERI-STRIP ANTIMIC 1/2X4 (GAUZE/BANDAGES/DRESSINGS) ×2 IMPLANT
COVER SURGICAL LIGHT HANDLE (MISCELLANEOUS) ×4 IMPLANT
COVER WAND RF STERILE (DRAPES) IMPLANT
DECANTER SPIKE VIAL GLASS SM (MISCELLANEOUS) ×2 IMPLANT
DEVICE SECURE STRAP 25 ABSORB (INSTRUMENTS) ×4 IMPLANT
DEVICE TROCAR PUNCTURE CLOSURE (ENDOMECHANICALS) ×2 IMPLANT
DRAPE WARM FLUID 44X44 (DRAPES) ×2 IMPLANT
DRSG TEGADERM 2-3/8X2-3/4 SM (GAUZE/BANDAGES/DRESSINGS) ×2 IMPLANT
DRSG TEGADERM 4X4.75 (GAUZE/BANDAGES/DRESSINGS) IMPLANT
ELECT REM PT RETURN 15FT ADLT (MISCELLANEOUS) ×2 IMPLANT
GAUZE SPONGE 2X2 8PLY STRL LF (GAUZE/BANDAGES/DRESSINGS) IMPLANT
GLOVE INDICATOR 8.0 STRL GRN (GLOVE) ×2 IMPLANT
GLOVE SURG LTX SZ8 (GLOVE) ×2 IMPLANT
GOWN STRL REUS W/TWL XL LVL3 (GOWN DISPOSABLE) ×4 IMPLANT
IRRIG SUCT STRYKERFLOW 2 WTIP (MISCELLANEOUS) ×2
IRRIGATION SUCT STRKRFLW 2 WTP (MISCELLANEOUS) ×1 IMPLANT
KIT BASIN OR (CUSTOM PROCEDURE TRAY) ×2 IMPLANT
KIT TURNOVER KIT A (KITS) ×2 IMPLANT
MESH VENTRALIGHT ST 10X13IN (Mesh General) ×2 IMPLANT
NEEDLE INSUFFLATION 14GA 120MM (NEEDLE) ×2 IMPLANT
NEEDLE SPNL 22GX3.5 QUINCKE BK (NEEDLE) ×2 IMPLANT
PAD POSITIONING PINK XL (MISCELLANEOUS) IMPLANT
SCISSORS LAP 5X35 DISP (ENDOMECHANICALS) ×2 IMPLANT
SET TUBE SMOKE EVAC HIGH FLOW (TUBING) ×2 IMPLANT
SLEEVE ADV FIXATION 5X100MM (TROCAR) ×4 IMPLANT
SPONGE GAUZE 2X2 8PLY STRL LF (GAUZE/BANDAGES/DRESSINGS) ×2 IMPLANT
SPONGE GAUZE 2X2 STER 10/PKG (GAUZE/BANDAGES/DRESSINGS)
SUT MNCRL AB 4-0 PS2 18 (SUTURE) ×4 IMPLANT
SUT PDS AB 1 CT1 27 (SUTURE) ×6 IMPLANT
SUT PROLENE 1 CT 1 30 (SUTURE) ×14 IMPLANT
SUT VIC AB 2-0 SH 27 (SUTURE)
SUT VIC AB 2-0 SH 27X BRD (SUTURE) IMPLANT
SUT VICRYL 0 UR6 27IN ABS (SUTURE) ×2 IMPLANT
TACKER 5MM HERNIA 3.5CML NAB (ENDOMECHANICALS) IMPLANT
TOWEL OR 17X26 10 PK STRL BLUE (TOWEL DISPOSABLE) ×2 IMPLANT
TOWEL OR NON WOVEN STRL DISP B (DISPOSABLE) ×2 IMPLANT
TRAY LAPAROSCOPIC (CUSTOM PROCEDURE TRAY) ×2 IMPLANT
TROCAR ADV FIXATION 5X100MM (TROCAR) ×2 IMPLANT
TROCAR XCEL BLUNT TIP 100MML (ENDOMECHANICALS) ×2 IMPLANT

## 2021-02-11 NOTE — Progress Notes (Signed)
Patient site is oozing scant sanguinous fluid. Previously just had steri-strips on them Redressed with gauze and tegaderm to 3 sites.

## 2021-02-11 NOTE — Transfer of Care (Signed)
Immediate Anesthesia Transfer of Care Note  Patient: [name]  Procedure(s) Performed: LAPAROSCOPIC REPAIR OF LEFT FLANK AND INGUINAL HERNIAS WITH MESH (N/A )  Patient Location: PACU  Anesthesia Type:General  Level of Consciousness: sedated, patient cooperative and responds to stimulation  Airway & Oxygen Therapy: Patient Spontanous Breathing and Patient connected to face mask oxygen  Post-op Assessment: Report given to RN and Post -op Vital signs reviewed and stable  Post vital signs: Reviewed and stable  Last Vitals:  Vitals Value Taken Time  BP 142/65 02/11/21 1043  Temp    Pulse 60 02/11/21 1045  Resp 16 02/11/21 1045  SpO2 100 % 02/11/21 1045  Vitals shown include unvalidated device data.  Last Pain:  Vitals:   02/11/21 0547  TempSrc: Oral  PainSc: 0-No pain      Patients Stated Pain Goal: 4 (12/12/70 5366)  Complications: No complications documented.

## 2021-02-11 NOTE — Anesthesia Postprocedure Evaluation (Signed)
Anesthesia Post Note  Patient: [name]  Procedure(s) Performed: LAPAROSCOPIC REPAIR OF LEFT FLANK AND INGUINAL HERNIAS WITH MESH (N/A )     Patient location during evaluation: PACU Anesthesia Type: General Level of consciousness: awake and alert Pain management: pain level controlled Vital Signs Assessment: post-procedure vital signs reviewed and stable Respiratory status: spontaneous breathing, nonlabored ventilation and respiratory function stable Cardiovascular status: blood pressure returned to baseline and stable Postop Assessment: no apparent nausea or vomiting Anesthetic complications: no   No complications documented.  Last Vitals:  Vitals:   02/11/21 1200 02/11/21 1215  BP:    Pulse:    Resp:    Temp: (!) 36.3 C 36.4 C  SpO2:      Last Pain:  Vitals:   02/11/21 1145  TempSrc:   PainSc: 0-No pain                 Audry Pili

## 2021-02-11 NOTE — Discharge Instructions (Signed)
HERNIA REPAIR: POST OP INSTRUCTIONS ° °###################################################################### ° °EAT °Gradually transition to a high fiber diet with a fiber supplement over the next few weeks after discharge.  Start with a pureed / full liquid diet (see below) ° °WALK °Walk an hour a day.  Control your pain to do that.   ° °CONTROL PAIN °Control pain so that you can walk, sleep, tolerate sneezing/coughing, and go up/down stairs. ° °HAVE A BOWEL MOVEMENT DAILY °Keep your bowels regular to avoid problems.  OK to try a laxative to override constipation.  OK to use an antidairrheal to slow down diarrhea.  Call if not better after 2 tries ° °CALL IF YOU HAVE PROBLEMS/CONCERNS °Call if you are still struggling despite following these instructions. °Call if you have concerns not answered by these instructions ° °###################################################################### ° ° ° °1. DIET: Follow a light bland diet & liquids the first 24 hours after arrival home, such as soup, liquids, starches, etc.  Be sure to drink plenty of fluids.  Quickly advance to a usual solid diet within a few days.  Avoid fast food or heavy meals as your are more likely to get nauseated or have irregular bowels.  A low-fat, high-fiber diet for the rest of your life is ideal.  ° °2. Take your usually prescribed home medications unless otherwise directed. ° °3. PAIN CONTROL: °a. Pain is best controlled by a usual combination of three different methods TOGETHER: °i. Ice/Heat °ii. Over the counter pain medication °iii. Prescription pain medication °b. Most patients will experience some swelling and bruising around the hernia(s) such as the bellybutton, groins, or old incisions.  Ice packs or heating pads (30-60 minutes up to 6 times a day) will help. Use ice for the first few days to help decrease swelling and bruising, then switch to heat to help relax tight/sore spots and speed recovery.  Some people prefer to use ice  alone, heat alone, alternating between ice & heat.  Experiment to what works for you.  Swelling and bruising can take several weeks to resolve.   °c. It is helpful to take an over-the-counter pain medication regularly for the first few weeks.  Choose one of the following that works best for you: °i. Naproxen (Aleve, etc)  Two 220mg tabs twice a day °ii. Ibuprofen (Advil, etc) Three 200mg tabs four times a day (every meal & bedtime) °iii. Acetaminophen (Tylenol, etc) 325-650mg four times a day (every meal & bedtime) °d. A  prescription for pain medication should be given to you upon discharge.  Take your pain medication as prescribed.  °i. If you are having problems/concerns with the prescription medicine (does not control pain, nausea, vomiting, rash, itching, etc), please call us [phone] to see if we need to switch you to a different pain medicine that will work better for you and/or control your side effect better. °ii. If you need a refill on your pain medication, please contact your pharmacy.  They will contact our office to request authorization. Prescriptions will not be filled after 5 pm or on week-ends. ° °4. Avoid getting constipated.  Between the surgery and the pain medications, it is common to experience some constipation.  Increasing fluid intake and taking a fiber supplement (such as Metamucil, Citrucel, FiberCon, MiraLax, etc) 1-2 times a day regularly will usually help prevent this problem from occurring.  A mild laxative (prune juice, Milk of Magnesia, MiraLax, etc) should be taken according to package directions if there are no bowel movements after 48   hours.   ° °5. Wash / shower every day.  You may shower over the dressings as they are waterproof.   ° °6. Remove your waterproof bandages, skin tapes, and other bandages 3 days after surgery. You may replace a dressing/Band-Aid to cover the incision for comfort if you wish. You may leave the incisions open to air.  You may replace a  dressing/Band-Aid to cover an incision for comfort if you wish.  Continue to shower over incision(s) after the dressing is off. ° °7. ACTIVITIES as tolerated:   °a. You may resume regular (light) daily activities beginning the next day--such as daily self-care, walking, climbing stairs--gradually increasing activities as tolerated.  Control your pain so that you can walk an hour a day.  If you can walk 30 minutes without difficulty, it is safe to try more intense activity such as jogging, treadmill, bicycling, low-impact aerobics, swimming, etc. °b. Save the most intensive and strenuous activity for last such as sit-ups, heavy lifting, contact sports, etc  Refrain from any heavy lifting or straining until you are off narcotics for pain control.   °c. DO NOT PUSH THROUGH PAIN.  Let pain be your guide: If it hurts to do something, don't do it.  Pain is your body warning you to avoid that activity for another week until the pain goes down. °d. You may drive when you are no longer taking prescription pain medication, you can comfortably wear a seatbelt, and you can safely maneuver your car and apply brakes. °e. You may have sexual intercourse when it is comfortable.  ° °8. FOLLOW UP in our office °a. Please call [facility] at [phone] to set up an appointment to see your surgeon in the office for a follow-up appointment approximately 2-3 weeks after your surgery. °b. Make sure that you call for this appointment the day you arrive home to insure a convenient appointment time. ° °9.  If you have disability of FMLA / Family leave forms, please bring the forms to the office for processing.  (do not give to your surgeon). ° °WHEN TO CALL US [phone]: °1. Poor pain control °2. Reactions / problems with new medications (rash/itching, nausea, etc)  °3. Fever over 101.5 F (38.5 C) °4. Inability to urinate °5. Nausea and/or vomiting °6. Worsening swelling or bruising °7. Continued bleeding from incision. °8. Increased pain,  redness, or drainage from the incision ° ° The clinic staff is available to answer your questions during regular business hours (8:30am-5pm).  Please don’t hesitate to call and ask to speak to one of our nurses for clinical concerns.  ° If you have a medical emergency, go to the nearest emergency room or call 911. ° A surgeon from [facility] is always on call at the hospitals in [place] ° °[facility] °[address], [address], [place], [place]  [address] ? ° [address] [address], [place], [place]   [address] °MAIN: [phone] ? TOLL FREE: [phone] ? FAX: [phone] °[email or web address] ° °

## 2021-02-11 NOTE — H&P (Signed)
[name] DOB: [birth year] Married / Language: English / Race: White Male  Patient Care Team: Biagio Borg, MD as PCP - General Stanford Breed Denice Bors, MD as PCP - Cardiology (Cardiology) Michael Boston, MD as Consulting Physician (General Surgery) Irine Seal, MD as Attending Physician (Urology) Carlena Bjornstad, MD (Cardiology) ` Patient sent for surgical consultation at the request of Irine Seal, Urology  Chief Complaint: Recurrent swelling on left flank. Probable hernia. ` ` The patient is a pleasant, active, thin male that has struggled with kidney stones for most of his life. Required open flank incision removal numerous stones many decades ago. He developed an incisional hernia. Recalls getting a repair by Dr. Harlow Asa probably about 20 years and an open fashion. He noticed some swelling there is any or 2 afterwards. Not particularly bothersome. However he is noted increasing swelling this past year. He is very active. Yardwork and exercises. Also regulate. He noted after doing leave clean up in the yard a few months ago he started getting worsening sharp pain on that left side. It is much more sensitive. He discussed with his urologist. Recurrent hernia suspected. Surgical consultation offered.  Patient does have a history of coronary disease. Had a myocardial infarction all bleeding required a bypass surgery by Dr. Cyndia Bent in 2012. Seems okay. He cannot recall for certain his cardiologist but records imply that its Dr. Dola Argyle. He is not on blood thinners. He takes anticholesterol medication. No diabetes. No sleep apnea. Moves his bowels every morning. No problems with urination or defecation. Again walk several miles without difficulty. He does not smoke  (Review of systems as stated in this history (HPI) or in the review of systems. Otherwise all other 12 point ROS are negative) ` ` ###########################################`  This patient  encounter took 40 minutes today to perform the following: obtain history, perform exam, review outside records, interpret tests & imaging, counsel the patient on their diagnosis; and, document this encounter, including findings & plan in the electronic health record (EHR).   Past Surgical History [name], MD; 12/20/2020 10:00 AM) Coronary Artery Bypass Graft Ventral Hernia Repair [04/16/2006]: Left flank incisional hernia from prior kidney stone removal. Open repair with 25 x 12 cm mesh. Dr. Harlow Asa  Allergies [facility] Marshall-McBride, CMA; 12/20/2020 9:23 AM) No Known Drug Allergies [12/20/2020]: No Known Allergies [12/20/2020]: Allergies Reconciled  Medication History (Kheana Marshall-McBride, CMA; 12/20/2020 9:23 AM) Simvastatin (40MG  Tablet, Oral) Active. Medications Reconciled  Other Problems (Kheana Marshall-McBride, CMA; 12/20/2020 9:23 AM) No pertinent past medical history     Review of Systems (Kheana Marshall-McBride CMA; 12/20/2020 9:23 AM) General Not Present- Appetite Loss, Chills, Fatigue, Fever, Night Sweats, Weight Gain and Weight Loss. Skin Not Present- Change in Wart/Mole, Dryness, Hives, Jaundice, New Lesions, Non-Healing Wounds, Rash and Ulcer. HEENT Not Present- Earache, Hearing Loss, Hoarseness, Nose Bleed, Oral Ulcers, Ringing in the Ears, Seasonal Allergies, Sinus Pain, Sore Throat, Visual Disturbances, Wears glasses/contact lenses and Yellow Eyes. Respiratory Not Present- Bloody sputum, Chronic Cough, Difficulty Breathing, Snoring and Wheezing. Breast Not Present- Breast Mass, Breast Pain, Nipple Discharge and Skin Changes. Cardiovascular Not Present- Chest Pain, Difficulty Breathing Lying Down, Leg Cramps, Palpitations, Rapid Heart Rate, Shortness of Breath and Swelling of Extremities. Gastrointestinal Present- Hemorrhoids. Not Present- Abdominal Pain, Bloating, Bloody Stool, Change in Bowel Habits, Chronic diarrhea, Constipation, Difficulty  Swallowing, Excessive gas, Gets full quickly at meals, Indigestion, Nausea, Rectal Pain and Vomiting.  Vitals (Kheana Marshall-McBride CMA; 12/20/2020 9:24 AM) 12/20/2020 9:23 AM Weight: 134.38  lb Height: 5in Body Surface Area: 0.26 m Body Mass Index: 3779 kg/m  Temp.: 98.71F  Pulse: 82 (Regular)  P.OX: 94% (Room air) BP: 110/68(Sitting, Left Arm, Standard)    BP (!) 150/75   Pulse 66   Temp (!) 97.5 F (36.4 C) (Oral)   Resp 18   Ht 5\' 3"  (1.6 m)   Wt 61.2 kg   SpO2 99%   BMI 23.91 kg/m  02/11/2021     Physical Exam [name] MD; 12/20/2020 12:40 PM)  General Mental Status-Alert. General Appearance-Not in acute distress, Not Sickly. Orientation-Oriented X3. Hydration-Well hydrated. Voice-Normal.  Integumentary Global Assessment Upon inspection and palpation of skin surfaces of the - Axillae: non-tender, no inflammation or ulceration, no drainage. and Distribution of scalp and body hair is normal. General Characteristics Temperature - normal warmth is noted.  Head and Neck Head-normocephalic, atraumatic with no lesions or palpable masses. Face Global Assessment - atraumatic, no absence of expression. Neck Global Assessment - no abnormal movements, no bruit auscultated on the right, no bruit auscultated on the left, no decreased range of motion, non-tender. Trachea-midline. Thyroid Gland Characteristics - non-tender.  Eye Eyeball - Left-Extraocular movements intact, No Nystagmus - Left. Eyeball - Right-Extraocular movements intact, No Nystagmus - Right. Cornea - Left-No Hazy - Left. Cornea - Right-No Hazy - Right. Sclera/Conjunctiva - Left-No scleral icterus, No Discharge - Left. Sclera/Conjunctiva - Right-No scleral icterus, No Discharge - Right. Pupil - Left-Direct reaction to light normal. Pupil - Right-Direct reaction to light normal.  ENMT Ears Pinna - Left - no drainage observed, no  generalized tenderness observed. Pinna - Right - no drainage observed, no generalized tenderness observed. Nose and Sinuses External Inspection of the Nose - no destructive lesion observed. Inspection of the nares - Left - quiet respiration. Inspection of the nares - Right - quiet respiration. Mouth and Throat Lips - Upper Lip - no fissures observed, no pallor noted. Lower Lip - no fissures observed, no pallor noted. Nasopharynx - no discharge present. Oral Cavity/Oropharynx - Tongue - no dryness observed. Oral Mucosa - no cyanosis observed. Hypopharynx - no evidence of airway distress observed.  Chest and Lung Exam Inspection Movements - Normal and Symmetrical. Accessory muscles - No use of accessory muscles in breathing. Palpation Palpation of the chest reveals - Non-tender. Auscultation Breath sounds - Normal and Clear.  Cardiovascular Auscultation Rhythm - Regular. Murmurs & Other Heart Sounds - Auscultation of the heart reveals - No Murmurs and No Systolic Clicks.  Abdomen Inspection Inspection of the abdomen reveals - No Visible peristalsis and No Abnormal pulsations. Umbilicus - No Bleeding, No Urine drainage. Palpation/Percussion Palpation and Percussion of the abdomen reveal - Soft, Non Tender, No Rebound tenderness, No Rigidity (guarding) and No Cutaneous hyperesthesia. Note: Obvious left flank bulging 18x7 cm region. Some Likely due to some stretching/denervation from flank incision. Bulging and sensitivity more sensitive along the anterior axial line.    Abdomen soft. Nontender. Not distended. No umbilical or other incisional hernias. No guarding.  Male Genitourinary Sexual Maturity Tanner 5 - Adult hair pattern and Adult penile size and shape. Note: Nor external male genitalia. Mild bulging in left groin suspicious for left inguinal hernia. No definite right inguinal hernia.  Peripheral Vascular Upper Extremity Inspection - Left - No Cyanotic nailbeds -  Left, Not Ischemic. Inspection - Right - No Cyanotic nailbeds - Right, Not Ischemic.  Neurologic Neurologic evaluation reveals -normal attention span and ability to concentrate, able to name objects and repeat phrases. Appropriate  fund of knowledge , normal sensation and normal coordination. Mental Status Affect - not angry, not paranoid. Cranial Nerves-Normal Bilaterally. Gait-Normal.  Neuropsychiatric Mental status exam performed with findings of-able to articulate well with normal speech/language, rate, volume and coherence, thought content normal with ability to perform basic computations and apply abstract reasoning and no evidence of hallucinations, delusions, obsessions or homicidal/suicidal ideation.  Musculoskeletal Global Assessment Spine, Ribs and Pelvis - no instability, subluxation or laxity. Right Upper Extremity - no instability, subluxation or laxity.  Lymphatic Head & Neck  General Head & Neck Lymphatics: Bilateral - Description - No Localized lymphadenopathy. Axillary  General Axillary Region: Bilateral - Description - No Localized lymphadenopathy. Femoral & Inguinal  Generalized Femoral & Inguinal Lymphatics: Left - Description - No Localized lymphadenopathy. Right - Description - No Localized lymphadenopathy.    Assessment & Plan [name] MD; 12/20/2020 10:00 AM)  RECURRENT INCISIONAL HERNIA (K43.2) Impression: Pleasant active thin nonsmoking gentleman with obvious recurrent left flank hernia also was some bowing/diastases most likely due to denervation from his large left flank incision to remove kidney stones decades before then.  I think she would benefit from hernia repair since it is getting larger & he's had episodes of sharp pain in its become more sensitive. We'll plan laparoscopic approach with the patient in partial decubitus left side up. Possibly fixed left inguinal hernia as well. willl need large sheet of mesh.   He is  interested in proceeding. Had a long discussion with him and try to address his questions and concerns. I cautioned against him being to intensely active for the first 6 weeks to avoid recurrence.   Written instructions provided [facility] Consent - Hernia Repair - Ventral/Incisional/Umbilical (Jeralyn Nolden): discussed with patient and provided information. Pt Education - [facility] Hernia Post-Op HCI (Jadaya Sommerfield): discussed with patient and provided information. Pt Education - [facility] Pain Control (Marshaun Lortie) Pt Education - Pamphlet Given - Laparoscopic Hernia Repair: discussed with patient and provided information. Pt Education - [facility] Mesh education: discussed with patient and provided information.  S/P CABG (CORONARY ARTERY BYPASS GRAFT) (Z95.1)  Current Plans I recommended obtaining preoperative cardiac clearance. I am concerned about the health of the patient and the ability to tolerate the operation. Therefore, we will request clearance by cardiology to better assess operative risk & see if a reevaluation, further workup, etc is needed. Also recommendations on how medications such as for anticoagulation and blood pressure should be managed/held/restarted after surgery.  [name], MD, FACS, MASCRS Gastrointestinal and Minimally Invasive Surgery  [facility] [address], [place], [place] [address] [phone] Fax [phone] Main/Paging  CONTACT INFORMATION: Weekday (9AM-5PM) concerns: Call [facility] main office at [phone] Weeknight (5PM-9AM) or Weekend/Holiday concerns: Check [email or web address] for General Surgery [facility] coverage (Please, do not use SecureChat as it is not reliable communication to operating surgeons for immediate patient care)            Electronically signed by Michael Boston, MD at 12/20/2020 12:41 PM

## 2021-02-11 NOTE — Interval H&P Note (Signed)
History and Physical Interval Note:  02/11/2021 7:01 AM  [name] Done  has presented today for surgery, with the diagnosis of ventral incisional recurrent and incarcerated abdominal wall hernia, left inguinal hernia.  The various methods of treatment have been discussed with the patient and family. After consideration of risks, benefits and other options for treatment, the patient has consented to  Procedure(s): [place] (N/A) as a surgical intervention.  The patient's history has been reviewed, patient examined, no change in status, stable for surgery.  I have reviewed the patient's chart and labs.  Questions were answered to the patient's satisfaction.     I have re-reviewed the the patient's records, history, medications, and allergies.  I have re-examined the patient.  I again discussed intraoperative plans and goals of post-operative recovery.  The patient agrees to proceed.  [name]  [birth year] [id number]  Patient Care Team: Biagio Borg, MD as PCP - General Stanford Breed Denice Bors, MD as PCP - Cardiology (Cardiology) Michael Boston, MD as Consulting Physician (General Surgery) Irine Seal, MD as Attending Physician (Urology) Carlena Bjornstad, MD (Cardiology)  Patient Active Problem List   Diagnosis Date Noted   Flank incisional hernia 02/11/2021   Diverticular disease of colon 02/11/2021   Personal history of colonic polyps 02/11/2021   Colon cancer screening 02/11/2021   Postcalcaneal bursitis of left foot 04/09/2020   Disorder of left eustachian tube 08/14/2019   Vertigo 12/19/2018   Partial degenerative rupture of right biceps tendon 07/18/2017   Right lateral epicondylitis 07/18/2017   Hyperglycemia 04/18/2017   High foot arch 03/29/2016   Arthritis, midfoot 03/29/2016   Tibialis posterior tendinitis 03/29/2016   Left foot pain 03/28/2016   Mitral regurgitation 09/05/2013   Carotid artery disease without cerebral  infarction [facility])    Hx of CABG    Ejection fraction    Preventative health care 08/30/2012   Atrial fibrillation ([place])    Thrombocytopenia ([place]) 07/26/2009   Headache(784.0) 07/15/2009   Hyperlipidemia 02/09/2009   Anxiety state 02/09/2009   Depression 02/09/2009   BENIGN PROSTATIC HYPERTROPHY 02/09/2009   PSA, INCREASED 02/09/2009   NEPHROLITHIASIS, HX OF 02/09/2009   CAD (coronary artery disease) 07/11/2008    Past Medical History:  Diagnosis Date   Arthritis    Atrial fibrillation ([place])    post MI-amiodarone stopped 07/2008   Benign prostatic hypertrophy    CAD (coronary artery disease) 07/2008   2 BMS placed for MI, August, 2009   Carotid artery disease without cerebral infarction [facility])    CORONARY ARTERY BYPASS GRAFT, HX OF 07/13/2009   August, 2009, Dr. Cyndia Bent   Ejection fraction    .   History of kidney stones    History of nephrolithiasis    Hx of CABG    Hx of colonoscopy    Hyperlipidemia    HYPERLIPIDEMIA 02/09/2009   Qualifier: Diagnosis of  By: Jenny Reichmann MD, Hunt Oris    Increased prostate specific antigen (PSA) velocity    Myocardial infarction ([place])    NEPHROLITHIASIS, HX OF 02/09/2009   Qualifier: Diagnosis of  By: Jenny Reichmann MD, Hunt Oris    Prostatitis    PSA elevation 09/06/2012   Thrombocytopenia [facility])     Past Surgical History:  Procedure Laterality Date   CORONARY ARTERY BYPASS GRAFT  08/05/08   x5, LIMA to the LAD.    CORONARY STENT PLACEMENT  07/2008   2 BMS to RCA   HEMORRHOID SURGERY  HYDROCELE EXCISION     INCISIONAL HERNIA REPAIR  04/16/2006   Left flank incisional hernia repair with mesh.  Dr Harlow Asa   left leg broken     PROSTATE BIOPSY  2007   neg   ROTATOR CUFF REPAIR     bilateral; Dr. Noemi Chapel    Social History   Socioeconomic History   Marital status: Married    Spouse name: Not on file   Number of children: 1   Years of education: Not on file   Highest education level: Not on file  Occupational History   Not on file  Tobacco Use    Smoking status: Never Smoker   Smokeless tobacco: Never Used  Vaping Use   Vaping Use: Never used  Substance and Sexual Activity   Alcohol use: No   Drug use: No   Sexual activity: Not Currently  Other Topics Concern   Not on file  Social History Narrative   Not on file   Social Determinants of Health   Financial Resource Strain: Not on file  Food Insecurity: Not on file  Transportation Needs: Not on file  Physical Activity: Not on file  Stress: Not on file  Social Connections: Not on file  Intimate Partner Violence: Not on file    Family History  Problem Relation Age of Onset   Lung cancer Mother    Leukemia Father    Healthy Brother    Colon cancer Neg Hx    Rectal cancer Neg Hx    Stomach cancer Neg Hx     Medications Prior to Admission  Medication Sig Dispense Refill Last Dose   aspirin 81 MG EC tablet Take 1 tablet (81 mg total) by mouth daily. Swallow whole. 30 tablet 12 Past Week at Unknown time   Cholecalciferol (VITAMIN D) 50 MCG (2000 UT) CAPS Take 2,000 Units by mouth daily.   Past Week at Unknown time   Multiple Vitamin (MULTIVITAMIN) tablet Take 1 tablet by mouth daily.   Past Week at Unknown time   simvastatin (ZOCOR) 40 MG tablet TAKE 1/2 (ONE-HALF) TABLET BY MOUTH AT BEDTIME (Patient taking differently: Take 20 mg by mouth daily at 6 PM.) 45 tablet 0 Past Week at Unknown time    Current Facility-Administered Medications  Medication Dose Route Frequency Provider Last Rate Last Admin   bupivacaine liposome (EXPAREL) 1.3 % injection 266 mg  20 mL Infiltration On Call to OR Michael Boston, MD       ceFAZolin (ANCEF) IVPB 2g/100 mL premix  2 g Intravenous On Call to OR Michael Boston, MD       chlorhexidine (PERIDEX) 0.12 % solution 15 mL  15 mL Mouth/Throat Once Stoltzfus, March Rummage, DO       Or   MEDLINE mouth rinse  15 mL Mouth Rinse Once Stoltzfus, March Rummage, DO       Chlorhexidine Gluconate Cloth 2 % PADS 6 each  6 each Topical Once Michael Boston, MD        feeding supplement (ENSURE PRE-SURGERY) liquid 296 mL  296 mL Oral Once Michael Boston, MD       lactated ringers infusion   Intravenous Continuous Stoltzfus, March Rummage, DO       lactated ringers infusion   Intravenous Continuous Albertha Ghee, MD 10 mL/hr at 02/11/21 0552 New Bag at 02/11/21 2376     Allergies  Allergen Reactions   Niacin Itching    Leg aches and itching    BP (!) 150/75  Pulse 66   Temp (!) 97.5 F (36.4 C) (Oral)   Resp 18   Ht 5\' 3"  (1.6 m)   Wt 61.2 kg   SpO2 99%   BMI 23.91 kg/m   Labs: No results found for this or any previous visit (from the past 48 hour(s)).  Imaging / Studies: No results found.   [name], M.D., F.A.C.S. Gastrointestinal and Minimally Invasive Surgery [facility] [address], [place], [place] [address] [phone] Main / Paging  02/11/2021 7:01 AM    [name]

## 2021-02-11 NOTE — Anesthesia Procedure Notes (Signed)
Procedure Name: Intubation Performed by: Gean Maidens, CRNA Pre-anesthesia Checklist: Patient identified, Emergency Drugs available, Suction available, Patient being monitored and Timeout performed Patient Re-evaluated:Patient Re-evaluated prior to induction Oxygen Delivery Method: Circle system utilized Preoxygenation: Pre-oxygenation with 100% oxygen Induction Type: IV induction Ventilation: Mask ventilation without difficulty Laryngoscope Size: Mac and 4 Grade View: Grade II Tube type: Oral Tube size: 7.5 mm Number of attempts: 1 Airway Equipment and Method: Stylet Placement Confirmation: ETT inserted through vocal cords under direct vision,  positive ETCO2 and breath sounds checked- equal and bilateral Secured at: 23 cm Tube secured with: Tape Dental Injury: Teeth and Oropharynx as per pre-operative assessment

## 2021-02-11 NOTE — Op Note (Signed)
02/11/2021  PATIENT:  [name]  80 y.o. male  Patient Care Team: Biagio Borg, MD as PCP - General Stanford Breed, Denice Bors, MD as PCP - Cardiology (Cardiology) Michael Boston, MD as Consulting Physician (General Surgery) Irine Seal, MD as Attending Physician (Urology) Carlena Bjornstad, MD (Cardiology)  PRE-OPERATIVE DIAGNOSIS:  ventral incisional recurrent and incarcerated abdominal wall hernia, left inguinal hernia  POST-OPERATIVE DIAGNOSIS:  Recurrent incisional flank hernia   PROCEDURE:   LAPAROSCOPIC REPAIR OF  INCISIONAL RECURRENT LEFT FLANK HERNIA WITH MESH TAP BLOCK - BILATERAL  SURGEON:  [name], MD  ASSISTANT:  Lonia Skinner, MD, [facility] PGY-5  ANESTHESIA:     General  Nerve block provided with liposomal bupivacaine (Experel) mixed with 0.25% bupivacaine as a Bilateral TAP block x 49mL each side at the level of the transverse abdominis & preperitoneal spaces along the flank at the anterior axillary line, from subcostal ridge to iliac crest under laparoscopic guidance   EBL:  Total I/O In: 1000 [I.V.:1000] Out: 40 [Blood:50]  Per anesthesia record  Delay start of Pharmacological VTE agent (>24hrs) due to surgical blood loss or risk of bleeding:  no  DRAINS: none   SPECIMEN:  No Specimen  DISPOSITION OF SPECIMEN:  N/A  COUNTS:  YES  PLAN OF CARE: Admit for overnight observation  PATIENT DISPOSITION:  PACU - hemodynamically stable.  INDICATION: Pleasant patient has developed a ventral wall abdominal hernia.  History of prior left kidney surgery with flank hernia status post open primary repair with Swiss cheese recurrence containing colon.  Recommendation was made for surgical repair  The anatomy & physiology of the abdominal wall was discussed. The pathophysiology of hernias was discussed. Natural history risks without surgery including progeressive enlargement, pain, incarceration & strangulation was discussed. Contributors to complications such as  smoking, obesity, diabetes, prior surgery, etc were discussed.  I feel the risks of no intervention will lead to serious problems that outweigh the operative risks; therefore, I recommended surgery to reduce and repair the hernia. I explained laparoscopic techniques with possible need for an open approach. I noted the probable use of mesh to patch and/or buttress the hernia repair.  Risks such as bleeding, infection, abscess, need for further treatment, heart attack, death, and other risks were discussed. I noted a good likelihood this will help address the problem. Goals of post-operative recovery were discussed as well. Possibility that this will not correct all symptoms was explained. I stressed the importance of low-impact activity, aggressive pain control, avoiding constipation, & not pushing through pain to minimize risk of post-operative chronic pain or injury. Possibility of reherniation especially with smoking, obesity, diabetes, immunosuppression, and other health conditions was discussed. We will work to minimize complications.   An educational handout further explaining the pathology & treatment options was given as well. Questions were answered. The patient expresses understanding & wishes to proceed with surgery.   OR FINDINGS: 15 x 6 cm Swiss cheese region along the left flank with moderate diastases bowing at old flank incision.  Moderate omental adhesions but no strangulation.  Type of repair: Laparoscopic underlay repair.  Primary repair of part of hernia   Placement of mesh: Retroperitoneal & intraperitoneal  Name of mesh: Bard Ventralight dual sided (polypropylene / Seprafilm)  Size of mesh: 33x27cm  Orientation: Transverse  Mesh overlap:  5-7cm   DESCRIPTION:   Informed consent was confirmed. The patient underwent general anaesthesia without difficulty. The patient was positioned appropriately left side up decubitus in flex with  careful protection of extremities. VTE  prevention in place. The patient's abdomen was clipped, prepped, & draped in a sterile fashion. Surgical timeout confirmed our plan.  The patient was positioned in reverse Trendelenburg. Abdominal entry was gained using Varess & then optical entry technique in the left upper abdomen. Entry was clean. I induced carbon dioxide insufflation. Camera inspection revealed no injury. Extra ports were carefully placed under direct laparoscopic visualization.   I could see adhesions on the parietal peritoneum under the abdominal wall.   I did laparoscopic lysis of adhesions to expose the entire anterior abdominal wall.  Freed off greater omentum from the midline and left side.  I then mobilized the left colon and left kidney in a lateral to medial fashion to the left posterior retroperitoneum.  Saw the left ureter in its course and rotated that more medially.  I primarily used focused sharp dissection.  I made sure hemostasis was good.  Patient had moderate diastases and bowing of the Swiss cheese hernia of a moderate region.  He had concerns about old stitches causing pain so I removed the majority him along the left posterior lateral costal ridge where he had noted some occasional pains.  I mapped out the region using a needle passer.   To ensure that I would have at least 5 cm radial coverage outside of the hernia defect, I chose a 33x27cm dual sided mesh.  I placed #1 Prolene stitches around the left posterior lateral paraspinal border x2 and 4 on the medial edge x4 = 6 total.  I rolled the mesh & placed into the peritoneal cavity through one of the Swiss cheese hernias around the mid axillary line.  I unrolled the mesh and positioned it appropriately.  We then used a spinal needle to help map out the region and used a suture passer to secure the mesh on the left retroperitoneum x2.  I then brought up the medial corners along the left anterior paramedian abdomen.  This help center the mesh for good control.  We then  brought out the remaining sutures on the left paraspinal retroperitoneal sutures and tied those down.  I then used a secure strap tacker to help tack the mesh along the retroperitoneum, iliac crest, left costal ridge, came more medially.  Brought the 4 sutures on the medial and closer to the left anterior paramedian region up using laparoscopic suture guidance.  Because this was a giant mesh I also had #1 Prolene was passed through the center of the mesh between iliac crest and left subcostal ridge superior and inferior to the hernias.  Did 5 central sutures.  We then used #1 PDS to close the hernia defect that we have passed the mesh through using laparoscopic suture passer x2.  This allowed the mesh to be centrally tacked in 4 locations with good transfascial sutures.  Of note we used a few of those transfacial sutures to help tack the left colon back along the line of Toldt by taking some good bites of tissue and epiploic appendages, taking care to avoid injuring the colon.  After this the mesh laid well and had good central and paraspinal and anterior paramedian fascial securing.  SecureStrap absorbable tacks around the edges and centrally.  Brought greater omentum to help cover up the region again.  Hemostasis was good.  Mesh laid well. I completed a broad field block of local anesthesia at fascial stitch sites & fascial closure areas.    Capnoperitoneum was evacuated. Ports were removed.  The skin was closed with Monocryl at the port sites and Steri-Strips on the fascial stitch puncture sites.  Patient is being extubated to go to the recovery room.  I discussed operative findings, updated the patient's status, discussed probable steps to recovery, and gave postoperative recommendations to the patient's spouse.  Recommendations were made.  Questions were answered.  She expressed understanding & appreciation.  [name], M.D., F.A.C.S. Gastrointestinal and Minimally Invasive Surgery [facility] [address], [place], [place] [address] [phone] Main / Paging  02/11/2021 10:18 AM

## 2021-02-11 NOTE — Progress Notes (Signed)
Patient c/o left eye vision loss with blurring. No pain expressed. MD with anesthesia notified.

## 2021-02-12 DIAGNOSIS — I4891 Unspecified atrial fibrillation: Secondary | ICD-10-CM | POA: Diagnosis not present

## 2021-02-12 DIAGNOSIS — F329 Major depressive disorder, single episode, unspecified: Secondary | ICD-10-CM | POA: Diagnosis not present

## 2021-02-12 DIAGNOSIS — Z951 Presence of aortocoronary bypass graft: Secondary | ICD-10-CM | POA: Diagnosis not present

## 2021-02-12 DIAGNOSIS — F419 Anxiety disorder, unspecified: Secondary | ICD-10-CM | POA: Diagnosis not present

## 2021-02-12 DIAGNOSIS — K432 Incisional hernia without obstruction or gangrene: Secondary | ICD-10-CM | POA: Diagnosis not present

## 2021-02-12 LAB — CBC
HCT: 38.8 % — ABNORMAL LOW (ref 39.0–52.0)
Hemoglobin: 12.7 g/dL — ABNORMAL LOW (ref 13.0–17.0)
MCH: 30.3 pg (ref 26.0–34.0)
MCHC: 32.7 g/dL (ref 30.0–36.0)
MCV: 92.6 fL (ref 80.0–100.0)
Platelets: 121 10*3/uL — ABNORMAL LOW (ref 150–400)
RBC: 4.19 MIL/uL — ABNORMAL LOW (ref 4.22–5.81)
RDW: 12 % (ref 11.5–15.5)
WBC: 13.4 10*3/uL — ABNORMAL HIGH (ref 4.0–10.5)
nRBC: 0 % (ref 0.0–0.2)

## 2021-02-12 LAB — MAGNESIUM: Magnesium: 1.8 mg/dL (ref 1.7–2.4)

## 2021-02-12 NOTE — Progress Notes (Signed)
Reviewed written instructions w pt and wife and questions answered. Pt d/c per w/c w all belongings in stable condition.

## 2021-02-14 ENCOUNTER — Encounter (HOSPITAL_COMMUNITY): Payer: Self-pay | Admitting: Surgery

## 2021-02-14 NOTE — Discharge Summary (Signed)
Physician Discharge Summary    Patient ID: [name] MRN: [id number] DOB/AGE: 1941/04/01  80 y.o.  Patient Care Team: Biagio Borg, MD as PCP - General Stanford Breed, Denice Bors, MD as PCP - Cardiology (Cardiology) Michael Boston, MD as Consulting Physician (General Surgery) Irine Seal, MD as Attending Physician (Urology) Carlena Bjornstad, MD (Cardiology)  Admit date: 02/11/2021  Discharge date: 02/12/2021 Hospital Stay = 0 days    Discharge Diagnoses:  Principal Problem:   Recurrent left flank incisional hernia s/p lap repair w mesh 02/11/2021 Active Problems:   Anxiety state   Depression   Atrial fibrillation (HCC)   Hx of CABG   3 Days Post-Op  02/11/2021  POST-OPERATIVE DIAGNOSIS:  Recurrent incisional flank hernia   PROCEDURE:   LAPAROSCOPIC REPAIR OF  INCISIONAL RECURRENT LEFT FLANK HERNIA WITH MESH TAP BLOCK - BILATERAL  SURGEON:  [name], MD  Consults: None  Hospital Course:   The patient underwent the surgery above.  Postoperatively, the patient gradually mobilized and advanced to a solid diet.  Pain and other symptoms were treated aggressively.    By the time of discharge, the patient was walking well the hallways, eating food, having flatus.  Pain was well-controlled on an oral medications.  Based on meeting discharge criteria and continuing to recover, I felt it was safe for the patient to be discharged from the hospital to further recover with close followup. Postoperative recommendations were discussed in detail.  They are written as well.  Discharged Condition: good  Discharge Exam: Blood pressure 114/60, pulse 63, temperature 98.1 F (36.7 C), temperature source Oral, resp. rate 18, height 5\' 3"  (1.6 m), weight 61.2 kg, SpO2 95 %.  General: Pt awake/alert/oriented x4 in No acute distress Eyes: PERRL, normal EOM.  Sclera clear.  No icterus Neuro: CN II-XII intact w/o focal sensory/motor deficits. Lymph: No head/neck/groin lymphadenopathy Psych:   No delerium/psychosis/paranoia HENT: Normocephalic, Mucus membranes moist.  No thrush Neck: Supple, No tracheal deviation Chest: No chest wall pain w good excursion CV:  Pulses intact.  Regular rhythm MS: Normal AROM mjr joints.  No obvious deformity Abdomen: Soft.  Nondistended.  Mildly tender at incisions only.  No active bleeding.  No evidence of peritonitis.  No incarcerated hernias. Ext:  SCDs BLE.  No mjr edema.  No cyanosis Skin: No petechiae / purpura   Disposition:    Follow-up Information    Michael Boston, MD. Schedule an appointment as soon as possible for a visit in 3 weeks.   Specialties: General Surgery, Colon and Rectal Surgery Why: To follow up after your operation Contact information: [address] [place] [address] [phone]               Discharge disposition: 01-Home or Self Care       Discharge Instructions    Call MD for:   Complete by: As directed    FEVER > 101.5 F  (temperatures < 101.5 F are not significant)   Call MD for:  extreme fatigue   Complete by: As directed    Call MD for:  persistant dizziness or light-headedness   Complete by: As directed    Call MD for:  persistant nausea and vomiting   Complete by: As directed    Call MD for:  redness, tenderness, or signs of infection (pain, swelling, redness, odor or green/yellow discharge around incision site)   Complete by: As directed    Call MD for:  severe uncontrolled pain  Complete by: As directed    Diet - low sodium heart healthy   Complete by: As directed    Start with a bland diet such as soups, liquids, starchy foods, low fat foods, etc. the first few days at home. Gradually advance to a solid, low-fat, high fiber diet by the end of the first week at home.   Add a fiber supplement to your diet (Metamucil, etc) If you feel full, bloated, or constipated, stay on a full liquid or pureed/blenderized diet for a few days until you feel better and are no longer  constipated.   Diet - low sodium heart healthy   Complete by: As directed    Discharge instructions   Complete by: As directed    See Discharge Instructions If you are not getting better after two weeks or are noticing you are getting worse, contact our office [phone] for further advice.  We may need to adjust your medications, re-evaluate you in the office, send you to the emergency room, or see what other things we can do to help. The clinic staff is available to answer your questions during regular business hours (8:30am-5pm).  Please don't hesitate to call and ask to speak to one of our nurses for clinical concerns.    A surgeon from [facility] is always on call at the hospitals 24 hours/day If you have a medical emergency, go to the nearest emergency room or call 911.   Discharge wound care:   Complete by: As directed    It is good for closed incisions and even open wounds to be washed every day.  Shower every day.  Short baths are fine.  Wash the incisions and wounds clean with soap & water.    You may leave closed incisions open to air if it is dry.   You may cover the incision with clean gauze & replace it after your daily shower for comfort.  STERISTRIPS:  You have skin tapes called Steristrips on your incisions.  Leave them in place, and they will fall off on their own like a scab in 1-3 weeks.  You may trim any edges that curl up with clean scissors.  You may remove them in the shower in a week.  TEGADERM:  You have clear gauze band-aid dressings over your closed incision(s).  Remove the dressings 3 days after surgery.   Driving Restrictions   Complete by: As directed    You may drive when: - you are no longer taking narcotic prescription pain medication - you can comfortably wear a seatbelt - you can safely make sudden turns/stops without pain.   Increase activity slowly   Complete by: As directed    Start light daily activities --- self-care, walking,  climbing stairs- beginning the day after surgery.  Gradually increase activities as tolerated.  Control your pain to be active.  Stop when you are tired.  Ideally, walk several times a day, eventually an hour a day.   Most people are back to most day-to-day activities in a few weeks.  It takes 4-6 weeks to get back to unrestricted, intense activity. If you can walk 30 minutes without difficulty, it is safe to try more intense activity such as jogging, treadmill, bicycling, low-impact aerobics, swimming, etc. Save the most intensive and strenuous activity for last (Usually 4-8 weeks after surgery) such as sit-ups, heavy lifting, contact sports, etc.  Refrain from any intense heavy lifting or straining until you are off narcotics for pain  control.  You will have off days, but things should improve week-by-week. DO NOT PUSH THROUGH PAIN.  Let pain be your guide: If it hurts to do something, don't do it.   Increase activity slowly   Complete by: As directed    Lifting restrictions   Complete by: As directed    If you can walk 30 minutes without difficulty, it is safe to try more intense activity such as jogging, treadmill, bicycling, low-impact aerobics, swimming, etc. Save the most intensive and strenuous activity for last (Usually 4-8 weeks after surgery) such as sit-ups, heavy lifting, contact sports, etc.   Refrain from any intense heavy lifting or straining until you are off narcotics for pain control.  You will have off days, but things should improve week-by-week. DO NOT PUSH THROUGH PAIN.  Let pain be your guide: If it hurts to do something, don't do it.  Pain is your body warning you to avoid that activity for another week until the pain goes down.   May shower / Bathe   Complete by: As directed    May walk up steps   Complete by: As directed    Remove dressing in 72 hours   Complete by: As directed    Make sure all dressings are removed by the third day after surgery.  Leave incisions open  to air.  OK to cover incisions with gauze or bandages as desired   Sexual Activity Restrictions   Complete by: As directed    You may have sexual intercourse when it is comfortable. If it hurts to do something, stop.      Allergies as of 02/12/2021      Reactions   Niacin Itching   Leg aches and itching      Medication List    TAKE these medications   aspirin 81 MG EC tablet Take 1 tablet (81 mg total) by mouth daily. Swallow whole.   multivitamin tablet Take 1 tablet by mouth daily.   oxyCODONE 5 MG immediate release tablet Commonly known as: Oxy IR/ROXICODONE Take 1-2 tablets (5-10 mg total) by mouth every 6 (six) hours as needed for moderate pain, severe pain or breakthrough pain.   simvastatin 40 MG tablet Commonly known as: ZOCOR TAKE 1/2 (ONE-HALF) TABLET BY MOUTH AT BEDTIME What changed: See the new instructions.   Vitamin D 50 MCG (2000 UT) Caps Take 2,000 Units by mouth daily.            Discharge Care Instructions  (From admission, onward)         Start     Ordered   02/11/21 0000  Discharge wound care:       Comments: It is good for closed incisions and even open wounds to be washed every day.  Shower every day.  Short baths are fine.  Wash the incisions and wounds clean with soap & water.    You may leave closed incisions open to air if it is dry.   You may cover the incision with clean gauze & replace it after your daily shower for comfort.  STERISTRIPS:  You have skin tapes called Steristrips on your incisions.  Leave them in place, and they will fall off on their own like a scab in 1-3 weeks.  You may trim any edges that curl up with clean scissors.  You may remove them in the shower in a week.  TEGADERM:  You have clear gauze band-aid dressings over your closed incision(s).  Remove the  dressings 3 days after surgery.   02/11/21 1021          Significant Diagnostic Studies:  Results for orders placed or performed during the hospital encounter  of 02/11/21 (from the past 72 hour(s))  Basic metabolic panel per protocol     Status: Abnormal   Collection Time: 02/11/21 10:58 AM  Result Value Ref Range   Sodium 136 135 - 145 mmol/L   Potassium 3.8 3.5 - 5.1 mmol/L   Chloride 104 98 - 111 mmol/L   CO2 22 22 - 32 mmol/L   Glucose, Bld 169 (H) 70 - 99 mg/dL    Comment: Glucose reference range applies only to samples taken after fasting for at least 8 hours.   BUN 16 8 - 23 mg/dL   Creatinine, Ser 1.08 0.61 - 1.24 mg/dL   Calcium 8.4 (L) 8.9 - 10.3 mg/dL   GFR, Estimated >60 >60 mL/min    Comment: (NOTE) Calculated using the CKD-EPI Creatinine Equation (2021)    Anion gap 10 5 - 15    Comment: Performed at [name], [place] [address] [place], [place] [address]  CBC per protocol     Status: Abnormal   Collection Time: 02/11/21 10:58 AM  Result Value Ref Range   WBC 17.1 (H) 4.0 - 10.5 K/uL   RBC 4.27 4.22 - 5.81 MIL/uL   Hemoglobin 13.0 13.0 - 17.0 g/dL   HCT 41.6 39.0 - 52.0 %   MCV 97.4 80.0 - 100.0 fL   MCH 30.4 26.0 - 34.0 pg   MCHC 31.3 30.0 - 36.0 g/dL   RDW 11.9 11.5 - 15.5 %   Platelets 102 (L) 150 - 400 K/uL    Comment: SPECIMEN CHECKED FOR CLOTS Immature Platelet Fraction may be clinically indicated, consider ordering this additional test [id number] REPEATED TO VERIFY PLATELET COUNT CONFIRMED BY SMEAR    nRBC 0.0 0.0 - 0.2 %    Comment: Performed at [facility], [place] [address], [place], [place] [address]  Magnesium     Status: None   Collection Time: 02/12/21  4:33 AM  Result Value Ref Range   Magnesium 1.8 1.7 - 2.4 mg/dL    Comment: Performed at [facility], [place] [address] [place], [place] [address]  CBC     Status: Abnormal   Collection Time: 02/12/21  4:33 AM  Result Value Ref Range   WBC 13.4 (H) 4.0 - 10.5 K/uL   RBC 4.19 (L) 4.22 - 5.81 MIL/uL   Hemoglobin 12.7 (L) 13.0 - 17.0 g/dL   HCT 38.8 (L) 39.0 - 52.0 %   MCV 92.6 80.0 - 100.0 fL    MCH 30.3 26.0 - 34.0 pg   MCHC 32.7 30.0 - 36.0 g/dL   RDW 12.0 11.5 - 15.5 %   Platelets 121 (L) 150 - 400 K/uL    Comment: Immature Platelet Fraction may be clinically indicated, consider ordering this additional test [id number] REPEATED TO VERIFY    nRBC 0.0 0.0 - 0.2 %    Comment: Performed at [facility], [place] [address] [place], [place] [address]    No results found.  Past Medical History:  Diagnosis Date  . Arthritis   . Atrial fibrillation ([place])    post MI-amiodarone stopped 07/2008  . Benign prostatic hypertrophy   . CAD (coronary artery disease) 07/2008   2 BMS placed for MI, August, 2009  . Carotid artery disease without cerebral infarction ([place])   .  CORONARY ARTERY BYPASS GRAFT, HX OF 07/13/2009   August, 2009, Dr. Cyndia Bent  . Ejection fraction    .  Marland Kitchen History of kidney stones   . History of nephrolithiasis   . Hx of CABG   . Hx of colonoscopy   . Hyperlipidemia   . HYPERLIPIDEMIA 02/09/2009   Qualifier: Diagnosis of  By: Jenny Reichmann MD, Hunt Oris   . Increased prostate specific antigen (PSA) velocity   . Myocardial infarction ([place])   . NEPHROLITHIASIS, HX OF 02/09/2009   Qualifier: Diagnosis of  By: Jenny Reichmann MD, Hunt Oris   . Prostatitis   . PSA elevation 09/06/2012  . Thrombocytopenia ([place])     Past Surgical History:  Procedure Laterality Date  . CORONARY ARTERY BYPASS GRAFT  08/05/08   x5, LIMA to the LAD.  Marland Kitchen CORONARY STENT PLACEMENT  07/2008   2 BMS to RCA  . HEMORRHOID SURGERY    . HYDROCELE EXCISION    . INCISIONAL HERNIA REPAIR  04/16/2006   Left flank incisional hernia repair with mesh.  Dr Harlow Asa  . left leg broken    . PROSTATE BIOPSY  2007   neg  . ROTATOR CUFF REPAIR     bilateral; Dr. Noemi Chapel    Social History   Socioeconomic History  . Marital status: Married    Spouse name: Not on file  . Number of children: 1  . Years of education: Not on file  . Highest education level: Not on file  Occupational History  . Not on file   Tobacco Use  . Smoking status: Never Smoker  . Smokeless tobacco: Never Used  Vaping Use  . Vaping Use: Never used  Substance and Sexual Activity  . Alcohol use: No  . Drug use: No  . Sexual activity: Not Currently  Other Topics Concern  . Not on file  Social History Narrative  . Not on file   Social Determinants of Health   Financial Resource Strain: Not on file  Food Insecurity: Not on file  Transportation Needs: Not on file  Physical Activity: Not on file  Stress: Not on file  Social Connections: Not on file  Intimate Partner Violence: Not on file    Family History  Problem Relation Age of Onset  . Lung cancer Mother   . Leukemia Father   . Healthy Brother   . Colon cancer Neg Hx   . Rectal cancer Neg Hx   . Stomach cancer Neg Hx     No current facility-administered medications for this encounter.   Current Outpatient Medications  Medication Sig Dispense Refill  . aspirin 81 MG EC tablet Take 1 tablet (81 mg total) by mouth daily. Swallow whole. 30 tablet 12  . Cholecalciferol (VITAMIN D) 50 MCG (2000 UT) CAPS Take 2,000 Units by mouth daily.    . Multiple Vitamin (MULTIVITAMIN) tablet Take 1 tablet by mouth daily.    Marland Kitchen oxyCODONE (OXY IR/ROXICODONE) 5 MG immediate release tablet Take 1-2 tablets (5-10 mg total) by mouth every 6 (six) hours as needed for moderate pain, severe pain or breakthrough pain. 30 tablet 0  . simvastatin (ZOCOR) 40 MG tablet TAKE 1/2 (ONE-HALF) TABLET BY MOUTH AT BEDTIME (Patient taking differently: Take 20 mg by mouth daily at 6 PM.) 45 tablet 0     Allergies  Allergen Reactions  . Niacin Itching    Leg aches and itching    Signed: Morton Peters, MD, FACS, MASCRS  Gastrointestinal and Minimally Invasive  [facility] [address], [place], [place] [address] [phone] Fax [phone] Main/Paging  CONTACT INFORMATION: Weekday (9AM-5PM) concerns: Call [facility] main  office at [phone] Weeknight (5PM-9AM) or Weekend/Holiday concerns: Check [email or web address] for General Surgery [facility] coverage (Please, do not use SecureChat as it is not reliable communication to operating surgeons for immediate patient care)      02/14/2021, 7:05 AM

## 2021-02-18 ENCOUNTER — Emergency Department (HOSPITAL_COMMUNITY): Payer: Medicare Other

## 2021-02-18 ENCOUNTER — Emergency Department (HOSPITAL_COMMUNITY)
Admission: EM | Admit: 2021-02-18 | Discharge: 2021-02-18 | Disposition: A | Discharge status: Home / Self Care | Payer: Medicare Other | Attending: Emergency Medicine | Admitting: Emergency Medicine

## 2021-02-18 ENCOUNTER — Other Ambulatory Visit: Payer: Self-pay

## 2021-02-18 DIAGNOSIS — R109 Unspecified abdominal pain: Secondary | ICD-10-CM | POA: Diagnosis not present

## 2021-02-18 DIAGNOSIS — I2510 Atherosclerotic heart disease of native coronary artery without angina pectoris: Secondary | ICD-10-CM | POA: Insufficient documentation

## 2021-02-18 DIAGNOSIS — K5909 Other constipation: Secondary | ICD-10-CM | POA: Diagnosis not present

## 2021-02-18 DIAGNOSIS — K5900 Constipation, unspecified: Secondary | ICD-10-CM | POA: Diagnosis not present

## 2021-02-18 DIAGNOSIS — K432 Incisional hernia without obstruction or gangrene: Secondary | ICD-10-CM | POA: Diagnosis present

## 2021-02-18 DIAGNOSIS — Z87442 Personal history of urinary calculi: Secondary | ICD-10-CM | POA: Insufficient documentation

## 2021-02-18 DIAGNOSIS — R339 Retention of urine, unspecified: Secondary | ICD-10-CM | POA: Insufficient documentation

## 2021-02-18 DIAGNOSIS — Z951 Presence of aortocoronary bypass graft: Secondary | ICD-10-CM | POA: Insufficient documentation

## 2021-02-18 DIAGNOSIS — Z7982 Long term (current) use of aspirin: Secondary | ICD-10-CM | POA: Diagnosis not present

## 2021-02-18 DIAGNOSIS — R338 Other retention of urine: Secondary | ICD-10-CM

## 2021-02-18 DIAGNOSIS — R1030 Lower abdominal pain, unspecified: Secondary | ICD-10-CM | POA: Diagnosis present

## 2021-02-18 DIAGNOSIS — K5903 Drug induced constipation: Secondary | ICD-10-CM | POA: Diagnosis not present

## 2021-02-18 LAB — CBC WITH DIFFERENTIAL/PLATELET
Abs Immature Granulocytes: 0.06 10*3/uL (ref 0.00–0.07)
Basophils Absolute: 0 10*3/uL (ref 0.0–0.1)
Basophils Relative: 0 %
Eosinophils Absolute: 0.3 10*3/uL (ref 0.0–0.5)
Eosinophils Relative: 3 %
HCT: 40.5 % (ref 39.0–52.0)
Hemoglobin: 13.7 g/dL (ref 13.0–17.0)
Immature Granulocytes: 1 %
Lymphocytes Relative: 15 %
Lymphs Abs: 1.5 10*3/uL (ref 0.7–4.0)
MCH: 30.1 pg (ref 26.0–34.0)
MCHC: 33.8 g/dL (ref 30.0–36.0)
MCV: 89 fL (ref 80.0–100.0)
Monocytes Absolute: 0.8 10*3/uL (ref 0.1–1.0)
Monocytes Relative: 8 %
Neutro Abs: 7.8 10*3/uL — ABNORMAL HIGH (ref 1.7–7.7)
Neutrophils Relative %: 73 %
Platelets: 183 10*3/uL (ref 150–400)
RBC: 4.55 MIL/uL (ref 4.22–5.81)
RDW: 12.1 % (ref 11.5–15.5)
WBC: 10.4 10*3/uL (ref 4.0–10.5)
nRBC: 0 % (ref 0.0–0.2)

## 2021-02-18 LAB — COMPREHENSIVE METABOLIC PANEL
ALT: 20 U/L (ref 0–44)
AST: 29 U/L (ref 15–41)
Albumin: 3.9 g/dL (ref 3.5–5.0)
Alkaline Phosphatase: 43 U/L (ref 38–126)
Anion gap: 13 (ref 5–15)
BUN: 12 mg/dL (ref 8–23)
CO2: 24 mmol/L (ref 22–32)
Calcium: 9.5 mg/dL (ref 8.9–10.3)
Chloride: 98 mmol/L (ref 98–111)
Creatinine, Ser: 1.09 mg/dL (ref 0.61–1.24)
GFR, Estimated: >60 mL/min (ref >60)
Glucose, Bld: 192 mg/dL — ABNORMAL HIGH (ref 70–99)
Potassium: 4.5 mmol/L (ref 3.5–5.1)
Sodium: 135 mmol/L (ref 135–145)
Total Bilirubin: 1.2 mg/dL (ref 0.3–1.2)
Total Protein: 7.4 g/dL (ref 6.5–8.1)

## 2021-02-18 LAB — URINALYSIS, ROUTINE W REFLEX MICROSCOPIC
Bilirubin Urine: NEGATIVE
Glucose, UA: NEGATIVE mg/dL
Ketones, ur: 5 mg/dL — AB
Leukocytes,Ua: NEGATIVE
Nitrite: NEGATIVE
Protein, ur: NEGATIVE mg/dL
Specific Gravity, Urine: 1.008 (ref 1.005–1.030)
pH: 6 (ref 5.0–8.0)

## 2021-02-18 LAB — LIPASE, BLOOD: Lipase: 22 U/L (ref 11–51)

## 2021-02-18 MED ORDER — BISACODYL 10 MG RE SUPP
10.0000 mg | Freq: Every day | RECTAL | Status: DC
  Administered 2021-02-18: 10 mg via RECTAL
  Filled 2021-02-18: qty 1

## 2021-02-18 MED ORDER — FLEET ENEMA 7-19 GM/118ML RE ENEM
1.0000 | ENEMA | Freq: Once | RECTAL | Status: AC
  Administered 2021-02-18: 1 via RECTAL
  Filled 2021-02-18: qty 1

## 2021-02-18 MED ORDER — MAGIC MOUTHWASH
15.0000 mL | Freq: Four times a day (QID) | ORAL | Status: DC | PRN
  Filled 2021-02-18: qty 15

## 2021-02-18 MED ORDER — DIAZEPAM 5 MG PO TABS: 5.0000 mg | ORAL_TABLET | Freq: Four times a day (QID) | ORAL | Status: DC | PRN

## 2021-02-18 MED ORDER — ONDANSETRON HCL 4 MG/2ML IJ SOLN
4.0000 mg | Freq: Once | INTRAMUSCULAR | Status: AC
  Administered 2021-02-18: 4 mg via INTRAVENOUS
  Filled 2021-02-18: qty 2

## 2021-02-18 MED ORDER — KETOROLAC TROMETHAMINE 30 MG/ML IJ SOLN
15.0000 mg | Freq: Once | INTRAMUSCULAR | Status: AC
  Administered 2021-02-18: 15 mg via INTRAVENOUS
  Filled 2021-02-18: qty 1

## 2021-02-18 MED ORDER — METHOCARBAMOL 750 MG PO TABS
750.0000 mg | ORAL_TABLET | Freq: Four times a day (QID) | ORAL | 2 refills | Status: DC | PRN
Start: 2021-02-18 — End: 2021-08-10

## 2021-02-18 MED ORDER — LIP MEDEX EX OINT: 1.0000 "application " | TOPICAL_OINTMENT | Freq: Two times a day (BID) | CUTANEOUS | Status: DC

## 2021-02-18 MED ORDER — SORBITOL 70 % SOLN
960.0000 mL | TOPICAL_OIL | Freq: Once | ORAL | Status: AC
  Administered 2021-02-18: 960 mL via RECTAL
  Filled 2021-02-18: qty 473

## 2021-02-18 MED ORDER — ACETAMINOPHEN 500 MG PO TABS
1000.0000 mg | ORAL_TABLET | Freq: Four times a day (QID) | ORAL | Status: DC
  Administered 2021-02-18: 1000 mg via ORAL
  Filled 2021-02-18: qty 2

## 2021-02-18 MED ORDER — METHOCARBAMOL 500 MG PO TABS: 1000.0000 mg | ORAL_TABLET | Freq: Four times a day (QID) | ORAL | Status: DC | PRN

## 2021-02-18 MED ORDER — POLYETHYLENE GLYCOL 3350 17 G PO PACK
17.0000 g | PACK | Freq: Two times a day (BID) | ORAL | Status: DC
  Administered 2021-02-18: 17 g via ORAL
  Filled 2021-02-18: qty 1

## 2021-02-18 MED ORDER — METHOCARBAMOL 1000 MG/10ML IJ SOLN
1000.0000 mg | Freq: Four times a day (QID) | INTRAVENOUS | Status: DC | PRN
  Filled 2021-02-18: qty 10

## 2021-02-18 MED ORDER — MAGNESIUM CITRATE PO SOLN
1.0000 | Freq: Once | ORAL | Status: AC
  Administered 2021-02-18: 1 via ORAL
  Filled 2021-02-18: qty 296

## 2021-02-18 MED ORDER — TAMSULOSIN HCL 0.4 MG PO CAPS: 0.4000 mg | ORAL_CAPSULE | Freq: Every day | ORAL | 0 refills | Status: AC

## 2021-02-18 MED ORDER — DIAZEPAM 5 MG PO TABS: 5.0000 mg | ORAL_TABLET | Freq: Three times a day (TID) | ORAL | 0 refills | Status: DC | PRN

## 2021-02-18 MED ORDER — HYDROCORTISONE ACETATE 25 MG RE SUPP
25.0000 mg | Freq: Two times a day (BID) | RECTAL | 0 refills | Status: DC
Start: 2021-02-18 — End: 2021-08-10

## 2021-02-18 NOTE — ED Triage Notes (Signed)
Pt states he had abdominal hernia repair on 3/4. States he has only had minimal BM since then. Reports taking miralax yesterday. Pt states he has not been able to urinate since midnight.

## 2021-02-18 NOTE — Discharge Instructions (Addendum)
Clear liquids for 48 hours along with 4x a day miralax unless having diarrhea  GETTING TO [place].  ######################################################################  EAT Gradually transition to a high fiber diet with a fiber supplement over the next few weeks after discharge.  Start with a pureed / full liquid diet (see below)  WALK Walk an hour a day.  Control your pain to do that.    HAVE A BOWEL MOVEMENT DAILY Keep your bowels regular to avoid problems.  OK to try a laxative to override constipation.  OK to use an antidairrheal to slow down diarrhea.  Call if not better after 2 tries  CALL IF YOU HAVE PROBLEMS/CONCERNS Call if you are still struggling despite following these instructions. Call if you have concerns not answered by these instructions  ######################################################################   Irregular bowel habits such as constipation and diarrhea can lead to many problems over time.  Having one soft bowel movement a day is the most important way to prevent further problems.  The anorectal canal is designed to handle stretching and feces to safely manage our ability to get rid of solid waste (feces, poop, stool) out of our body.  BUT, hard constipated stools can act like ripping concrete bricks and diarrhea can be a burning fire to this very sensitive area of our body, causing inflamed hemorrhoids, anal fissures, increasing risk is perirectal abscesses, abdominal pain/bloating, an making irritable bowel worse.      The goal: ONE SOFT BOWEL MOVEMENT A DAY!  To have soft, regular bowel movements:  Drink plenty of fluids, consider 4-6 tall glasses of water a day.   Take plenty of fiber.  Fiber is the undigested part of plant food that passes into the colon, acting s "natures broom" to encourage bowel motility and movement.  Fiber can absorb and hold large amounts of water. This results in a larger, bulkier stool, which is soft and easier to  pass. Work gradually over several weeks up to 6 servings a day of fiber (25g a day even more if needed) in the form of: Vegetables -- Root (potatoes, carrots, turnips), leafy green (lettuce, salad greens, celery, spinach), or cooked high residue (cabbage, broccoli, etc) Fruit -- Fresh (unpeeled skin & pulp), Dried (prunes, apricots, cherries, etc ),  or stewed ( applesauce)  Whole grain breads, pasta, etc (whole wheat)  Bran cereals  Bulking Agents -- This type of water-retaining fiber generally is easily obtained each day by one of the following:  Psyllium bran -- The psyllium plant is remarkable because its ground seeds can retain so much water. This product is available as Metamucil, Konsyl, Effersyllium, Per Diem Fiber, or the less expensive generic preparation in drug and health food stores. Although labeled a laxative, it really is not a laxative.  Methylcellulose -- This is another fiber derived from wood which also retains water. It is available as Citrucel. Polyethylene Glycol - and "artificial" fiber commonly called Miralax or Glycolax.  It is helpful for people with gassy or bloated feelings with regular fiber Flax Seed - a less gassy fiber than psyllium No reading or other relaxing activity while on the toilet. If bowel movements take longer than 5 minutes, you are too constipated AVOID CONSTIPATION.  High fiber and water intake usually takes care of this.  Sometimes a laxative is needed to stimulate more frequent bowel movements, but  Laxatives are not a good long-term solution as it can wear the colon out.  They can help jump-start bowels if constipated, but  should be relied on constantly without discussing with your doctor Osmotics (Milk of Magnesia, Fleets phosphosoda, Magnesium citrate, MiraLax, GoLytely) are safer than  Stimulants (Senokot, Castor Oil, Dulcolax, Ex Lax)    Avoid taking laxatives for more than 7 days in a row.  IF SEVERELY CONSTIPATED, try a Bowel Retraining  Program: Do not use laxatives.  Eat a diet high in roughage, such as bran cereals and leafy vegetables.  Drink six (6) ounces of prune or apricot juice each morning.  Eat two (2) large servings of stewed fruit each day.  Take one (1) heaping tablespoon of a psyllium-based bulking agent twice a day. Use sugar-free sweetener when possible to avoid excessive calories.  Eat a normal breakfast.  Set aside 15 minutes after breakfast to sit on the toilet, but do not strain to have a bowel movement.  If you do not have a bowel movement by the third day, use an enema and repeat the above steps.   CONTROLLING DIARRHEA  TAKE A FIBER SUPPLEMENT (FiberCon or Benefiner soluble fiber) twice a day - to thicken stools by absorbing excess fluid and retrain the intestines to act more normally.  Slowly increase the dose over a few weeks.  Too much fiber too soon can backfire and cause cramping & bloating.  TAKE AN IRON SUPPLEMENT twice a day to naturally constipate your bowels.  Usually ferrous sulfate 325mg  twice a day)  TAKE ANTI-DIARRHEAL MEDICINES: Loperamide (Imodium) can slow down diarrhea.  Start with two tablets (= 4mg ) first and then try one tablet every 6 hours.  Can go up to 2 pills four times day (8 pills of 2mg  max) Avoid if you are having fevers or severe pain.  If you are not better or start feeling worse, stop all medicines and call your doctor for advice LoMotil (Diphenoxylate / Atropine) is another medicine that can constipate & slow down bowel moevements Pepto Bismol (bismuth) can gently thicken bowels as well  If diarrhea is worse,: drink plenty of liquids and try simpler foods for a few days to avoid stressing your intestines further. Avoid dairy products (especially milk & ice cream) for a short time.  The intestines often can lose the ability to digest lactose when stressed. Avoid foods that cause gassiness or bloating.  Typical foods include beans and other legumes, cabbage, broccoli, and  dairy foods.  Every person has some sensitivity to other foods, so listen to our body and avoid those foods that trigger problems for you.Call your doctor if you are getting worse or not better.  Sometimes further testing (cultures, endoscopy, X-ray studies, bloodwork, etc) may be needed to help diagnose and treat the cause of the diarrhea. Take extra anti-diarrheal medicines (maximum is 8 pills of 2mg  loperamide a day)  TROUBLESHOOTING IRREGULAR BOWELS 1) Avoid extremes of bowel movements (no bad constipation/diarrhea) 2) Miralax 17gm mixed in 8oz. water or juice-daily. May use BID as needed.  3) Gas-x,Phazyme, etc. as needed for gas & bloating.  4) Soft,bland diet. No spicy,greasy,fried foods.  5) Prilosec over-the-counter as needed  6) May hold gluten/wheat products from diet to see if symptoms improve.  7)  May try probiotics (Align, Activa, etc) to help calm the bowels down 7) If symptoms become worse call back immediately.   Managing Pain  ######################################################################   CONTROL PAIN Control pain so that you can walk, sleep, tolerate sneezing/coughing, go up/down stairs.  (Good pain control is not pain free only when lying still, unable to move)  WALK Walk  an hour a day.  Control your pain to do that.   HAVE A BOWEL MOVEMENT DAILY Keep your bowels regular to avoid problems.  OK to try a laxative to override constipation.  OK to use an antidairrheal to slow down diarrhea.  Call if not better after 2 tries  CALL IF YOU HAVE PROBLEMS/CONCERNS Call if you are still struggling despite following these instructions. Call if you have concerns not answered by these instructions  ######################################################################    Pain after surgery or related to activity is often due to strain/injury to muscle, tendon, nerves and/or incisions.  This pain is usually short-term and will improve in a few months.   Many  people find it helpful to do the following things TOGETHER to help speed the process of healing and to get back to regular activity more quickly:  Avoid heavy physical activity at first No lifting greater than 20 pounds at first, then increase to lifting as tolerated over the next few weeks Do not "push through" the pain.  Listen to your body and avoid positions and maneuvers than reproduce the pain.  Wait a few days before trying something more intense Walking is okay as tolerated, but go slowly and stop when getting sore.  If you can walk 30 minutes without stopping or pain, you can try more intense activity (running, jogging, aerobics, cycling, swimming, treadmill, sex, sports, weightlifting, etc ) Remember: If it hurts to do it, then don't do it!  Take Anti-inflammatory medication Choose ONE of the following over-the-counter medications:    Acetaminophen 500mg  tabs (Tylenol) 1-2 pills with every meal and just before bedtime (avoid if you have liver problems) Naproxen 220mg  tabs (ex. Aleve) 1-2 pills twice a day (avoid if you have kidney, stomach, IBD, or bleeding problems) Ibuprofen 200mg  tabs (ex. Advil, Motrin) 3-4 pills with every meal and just before bedtime (avoid if you have kidney, stomach, IBD, or bleeding problems) Take with food/snack around the clock for 1-2 weeks This helps the muscle and nerve tissues become less irritable and calm down faster  Use a Heating pad or Ice/Cold Pack 4-6 times a day May use warm bath/hottub  or showers  Try Gentle Massage and/or Stretching  at the area of pain many times a day stop if you feel pain - do not overdo it  Try these steps together to help you body heal faster and avoid making things get worse.  Doing just one of these things may not be enough.    If you are not getting better after two weeks or are noticing you are getting worse, contact our office for further advice; we may need to re-evaluate you & see what other things we can do  to help.   Indwelling Urinary Catheter Care, Adult An indwelling urinary catheter is a thin, flexible, germ-free (sterile) tube that is placed into the bladder to help drain urine out of the body. The catheter is inserted into the part of the body that drains urine from the bladder (urethra). Urine drains from the catheter into a drainage bag outside of the body. Taking good care of your catheter will keep it working properly and help to prevent problems from developing. What are the risks? Bacteria may get into your bladder and cause a urinary tract infection. Urine flow can become blocked. This can happen if the catheter is not working correctly, or if you have sediment or a blood clot in your bladder or the catheter. Tissue near the catheter may become  irritated and bleed. How to wear your catheter and your drainage bag Supplies needed Adhesive tape or a leg strap. Alcohol wipe or soap and water (if you use tape). A clean towel (if you use tape). Overnight drainage bag. Smaller drainage bag (leg bag). Wearing your catheter and bag Use adhesive tape or a leg strap to attach your catheter to your leg. Make sure the catheter is not pulled tight. If a leg strap gets wet, replace it with a dry one. If you use adhesive tape: Use an alcohol wipe or soap and water to wash off any stickiness on your skin where you had tape before. Use a clean towel to pat-dry the area. Apply the new tape. You should have received a large overnight drainage bag and a smaller leg bag that fits underneath clothing. You may wear the overnight bag at any time, but you should not wear the leg bag at night. Always wear the leg bag below your knee. Make sure the overnight drainage bag is always lower than the level of your bladder, but do not let it touch the floor. Before you go to sleep, hang the bag inside a wastebasket that is covered by a clean plastic bag. How to care for your skin around the catheter Supplies  needed A clean washcloth. Water and mild soap. A clean towel. Caring for your skin and catheter Every day, use a clean washcloth and soapy water to clean the skin around your catheter. Wash your hands with soap and water. Wet a washcloth in warm water and mild soap. Clean the skin around your urethra. If you are male: Use one hand to gently spread the folds of skin around your vagina (labia). With the washcloth in your other hand, wipe the inner side of your labia on each side. Do this in a front-to-back direction. If you are male: Use one hand to pull back any skin that covers the end of your penis (foreskin). With the washcloth in your other hand, wipe your penis in small circles. Start wiping at the tip of your penis, then move outward from the catheter. Move the foreskin back in place, if this applies. With your free hand, hold the catheter close to where it enters your body. Keep holding the catheter during cleaning so it does not get pulled out. Use your other hand to clean the catheter with the washcloth. Only wipe downward on the catheter. Do not wipe upward toward your body, because that may push bacteria into your urethra and cause infection. Use a clean towel to pat-dry the catheter and the skin around it. Make sure to wipe off all soap. Wash your hands with soap and water. Shower every day. Do not take baths. Do not use cream, ointment, or lotion on the area where the catheter enters your body, unless your health care provider tells you to do that. Do not use powders, sprays, or lotions on your genital area. Check your skin around the catheter every day for signs of infection. Check for: Redness, swelling, or pain. Fluid or blood. Warmth. Pus or a bad smell.      How to empty the drainage bag Supplies needed Rubbing alcohol. Gauze pad or cotton ball. Adhesive tape or a leg strap. Emptying the bag Empty your drainage bag (your overnight drainage bag or your leg bag)  when it is ?- full, or at least 2-3 times a day. Clean the drainage bag according to the manufacturer's instructions or as told by your  health care provider. Wash your hands with soap and water. Detach the drainage bag from your leg. Hold the drainage bag over the toilet or a clean container. Make sure the drainage bag is lower than your hips and bladder. This stops urine from going back into the tubing and into your bladder. Open the pour spout at the bottom of the bag. Empty the urine into the toilet or container. Do not let the pour spout touch any surface. This precaution is important to prevent bacteria from getting in the bag and causing infection. Apply rubbing alcohol to a gauze pad or cotton ball. Use the gauze pad or cotton ball to clean the pour spout. Close the pour spout. Attach the bag to your leg with adhesive tape or a leg strap. Wash your hands with soap and water. How to change the drainage bag Supplies needed: Alcohol wipes. A clean drainage bag. Adhesive tape or a leg strap. Changing the bag Replace your drainage bag with a clean bag if it leaks, starts to smell bad, or looks dirty. Wash your hands with soap and water. Detach the dirty drainage bag from your leg. Pinch the catheter with your fingers so that urine does not spill out. Disconnect the catheter tube from the drainage tube at the connection valve. Do not let the tubes touch any surface. Clean the end of the catheter tube with an alcohol wipe. Use a different alcohol wipe to clean the end of the drainage tube. Connect the catheter tube to the drainage tube of the clean bag. Attach the clean bag to your leg with adhesive tape or a leg strap. Avoid attaching the new bag too tightly. Wash your hands with soap and water. General instructions Never pull on your catheter or try to remove it. Pulling can damage your internal tissues. Always wash your hands before and after you handle your catheter or drainage bag.  Use a mild, fragrance-free soap. If soap and water are not available, use hand sanitizer. Always make sure there are no twists or bends (kinks) in the catheter tube. Always make sure there are no leaks in the catheter or drainage bag. Drink enough fluid to keep your urine pale yellow. Do not take baths, swim, or use a hot tub. If you are male, wipe from front to back after having a bowel movement.   Contact a health care provider if: Your urine is cloudy. Your urine smells unusually bad. Your catheter gets clogged. Your catheter starts to leak. Your bladder feels full. Get help right away if: You have redness, swelling, or pain where the catheter enters your body. You have fluid, blood, pus, or a bad smell coming from the area where the catheter enters your body. The area where the catheter enters your body feels warm to the touch. You have a fever. You have pain in your abdomen, legs, lower back, or bladder. You see blood in the catheter. Your urine is pink or red. You have nausea, vomiting, or chills. Your urine is not draining into the bag. Your catheter gets pulled out. Summary An indwelling urinary catheter is a thin, flexible, germ-free (sterile) tube that is placed into the bladder to help drain urine out of the body. The catheter is inserted into the part of the body that drains urine from the bladder (urethra). Take good care of your catheter to keep it working properly and help prevent problems from developing. Always wash your hands before and after you handle your catheter or drainage  bag. Never pull on your catheter or try to remove it. This information is not intended to replace advice given to you by your health care provider. Make sure you discuss any questions you have with your health care provider. Document Revised: 02/02/2020 Document Reviewed: 07/13/2017 Elsevier Patient Education  2021 [place].   Hemorrhoids Hemorrhoids are swollen veins that may  develop: In the butt (rectum). These are called internal hemorrhoids. Around the opening of the butt (anus). These are called external hemorrhoids. Hemorrhoids can cause pain, itching, or bleeding. Most of the time, they do not cause serious problems. They usually get better with diet changes, lifestyle changes, and other home treatments. What are the causes? This condition may be caused by: Having trouble pooping (constipation). Pushing hard (straining) to poop. Watery poop (diarrhea). Pregnancy. Being very overweight (obese). Sitting for long periods of time. Heavy lifting or other activity that causes you to strain. Anal sex. Riding a bike for a long period of time. What are the signs or symptoms? Symptoms of this condition include: Pain. Itching or soreness in the butt. Bleeding from the butt. Leaking poop. Swelling in the area. One or more lumps around the opening of your butt. How is this diagnosed? A doctor can often diagnose this condition by looking at the affected area. The doctor may also: Do an exam that involves feeling the area with a gloved hand (digital rectal exam). Examine the area inside your butt using a small tube (anoscope). Order blood tests. This may be done if you have lost a lot of blood. Have you get a test that involves looking inside the colon using a flexible tube with a camera on the end (sigmoidoscopy or colonoscopy). How is this treated? This condition can usually be treated at home. Your doctor may tell you to change what you eat, make lifestyle changes, or try home treatments. If these do not help, procedures can be done to remove the hemorrhoids or make them smaller. These may involve: Placing rubber bands at the base of the hemorrhoids to cut off their blood supply. Injecting medicine into the hemorrhoids to shrink them. Shining a type of light energy onto the hemorrhoids to cause them to fall off. Doing surgery to remove the hemorrhoids or cut  off their blood supply. Follow these instructions at home: Eating and drinking Eat foods that have a lot of fiber in them. These include whole grains, beans, nuts, fruits, and vegetables. Ask your doctor about taking products that have added fiber (fibersupplements). Reduce the amount of fat in your diet. You can do this by: Eating low-fat dairy products. Eating less red meat. Avoiding processed foods. Drink enough fluid to keep your pee (urine) pale yellow.   Managing pain and swelling Take a warm-water bath (sitz bath) for 20 minutes to ease pain. Do this 3-4 times a day. You may do this in a bathtub or using a portable sitz bath that fits over the toilet. If told, put ice on the painful area. It may be helpful to use ice between your warm baths. Put ice in a plastic bag. Place a towel between your skin and the bag. Leave the ice on for 20 minutes, 2-3 times a day.   General instructions Take over-the-counter and prescription medicines only as told by your doctor. Medicated creams and medicines may be used as told. Exercise often. Ask your doctor how much and what kind of exercise is best for you. Go to the bathroom when you have the  urge to poop. Do not wait. Avoid pushing too hard when you poop. Keep your butt dry and clean. Use wet toilet paper or moist towelettes after pooping. Do not sit on the toilet for a long time. Keep all follow-up visits as told by your doctor. This is important. Contact a doctor if you: Have pain and swelling that do not get better with treatment or medicine. Have trouble pooping. Cannot poop. Have pain or swelling outside the area of the hemorrhoids. Get help right away if you have: Bleeding that will not stop. Summary Hemorrhoids are swollen veins in the butt or around the opening of the butt. They can cause pain, itching, or bleeding. Eat foods that have a lot of fiber in them. These include whole grains, beans, nuts, fruits, and vegetables. Take  a warm-water bath (sitz bath) for 20 minutes to ease pain. Do this 3-4 times a day. This information is not intended to replace advice given to you by your health care provider. Make sure you discuss any questions you have with your health care provider. Document Revised: 12/05/2018 Document Reviewed: 04/18/2018 Elsevier Patient Education  2021 [place].   How to Take a [employer] A sitz bath is a warm water bath that may be used to care for your rectum, genital area, or the area between your rectum and genitals (perineum). In a sitz bath, the water only comes up to your hips and covers your buttocks. A sitz bath may be done in a bathtub or with a portable sitz bath that fits over the toilet. Your health care provider may recommend a sitz bath to help: Relieve pain and discomfort after delivering a baby. Relieve pain and itching from hemorrhoids or anal fissures. Relieve pain after certain surgeries. Relax muscles that are sore or tight. How to take a sitz bath Take 3-4 sitz baths a day, or as many as told by your health care provider. Bathtub sitz bath To take a sitz bath in a bathtub: Partially fill a bathtub with warm water. The water should be deep enough to cover your hips and buttocks when you are sitting in the tub. Follow your health care provider's instructions if you are told to put medicine in the water. Sit in the water. Open the tub drain a little, and leave it open during your bath. Turn on the warm water again, enough to replace the water that is draining out. Keep the water running throughout your bath. This helps keep the water at the right level and temperature. Soak in the water for 15-20 minutes, or as long as told by your health care provider. When you are done, be careful when you stand up. You may feel dizzy. After the sitz bath, pat yourself dry. Do not rub your skin to dry it.   Over-the-toilet sitz bath To take a sitz bath with an over-the-toilet basin: Follow  the manufacturer's instructions. Fill the basin with warm water. Follow your health care provider's instructions if you were told to put medicine in the water. Sit on the seat. Make sure the water covers your buttocks and perineum. Soak in the water for 15-20 minutes, or as long as told by your health care provider. After the sitz bath, pat yourself dry. Do not rub your skin to dry it. Clean and dry the basin between uses. Discard the basin if it cracks, or according to the manufacturer's instructions.   Contact a health care provider if: Your pain or itching gets worse. Do  not continue with sitz baths if your symptoms get worse. You have new symptoms. Do not continue with sitz baths until you talk with your health care provider. Summary A sitz bath is a warm water bath in which the water only comes up to your hips and covers your buttocks. A sitz bath may help relieve pain and discomfort after delivering a baby. It also may help with pain and itching from hemorrhoids or anal fissures, or pain after certain surgeries. It can also help to relax muscles that are sore or tight. Take 3-4 sitz baths a day, or as many as told by your health care provider. Soak in the water for 15-20 minutes. Do not continue with sitz baths if your symptoms get worse. This information is not intended to replace advice given to you by your health care provider. Make sure you discuss any questions you have with your health care provider. Document Revised: 08/12/2020 Document Reviewed: 08/12/2020 Elsevier Patient Education  2021 [employer].

## 2021-02-18 NOTE — ED Notes (Signed)
Pt. On commode. Will update vitals once back in the bed.

## 2021-02-18 NOTE — ED Provider Notes (Signed)
[facility] DEPT Provider Note   CSN: [id number] Arrival date & time: 02/18/21  8182     History Chief Complaint  Patient presents with  . Constipation    [name] is a 80 y.o. male.  HPI   Patient had hernia surgery on March 4.  He has been taking pain medications including oxycodone as well as Tylenol.  Patient states ever since then he has not had a bowel movement.  His symptoms continued to progress and he called the surgeon's office yesterday.  He was instructed to take MiraLAX. Patient has been taking that without relief.  He does not feel like he is passing normal amounts of stool only a tiny amount.  Patient now states that since 3 AM he is not able to urinate as well.  He feels a lot of lower abdominal cramping and discomfort.  He feels like he needs to have a bowel movement.  He denies any vomiting although has had some nausea.  No fevers or chills. Past Medical History:  Diagnosis Date  . Arthritis   . Atrial fibrillation ([place])    post MI-amiodarone stopped 07/2008  . Benign prostatic hypertrophy   . CAD (coronary artery disease) 07/2008   2 BMS placed for MI, August, 2009  . Carotid artery disease without cerebral infarction ([place])   . CORONARY ARTERY BYPASS GRAFT, HX OF 07/13/2009   August, 2009, Dr. Cyndia Bent  . Ejection fraction    .  Marland Kitchen History of kidney stones   . History of nephrolithiasis   . Hx of CABG   . Hx of colonoscopy   . Hyperlipidemia   . HYPERLIPIDEMIA 02/09/2009   Qualifier: Diagnosis of  By: Jenny Reichmann MD, Hunt Oris   . Increased prostate specific antigen (PSA) velocity   . Myocardial infarction ([place])   . NEPHROLITHIASIS, HX OF 02/09/2009   Qualifier: Diagnosis of  By: Jenny Reichmann MD, Hunt Oris   . Prostatitis   . PSA elevation 09/06/2012  . Thrombocytopenia [facility])     Patient Active Problem List   Diagnosis Date Noted  . Constipation, chronic 02/18/2021  . Acute urinary retention 02/18/2021  . Diverticular disease of colon  02/11/2021  . Personal history of colonic polyps 02/11/2021  . Colon cancer screening 02/11/2021  . Recurrent left flank incisional hernia s/p lap repair w mesh 02/11/2021 02/11/2021  . Postcalcaneal bursitis of left foot 04/09/2020  . Disorder of left eustachian tube 08/14/2019  . Vertigo 12/19/2018  . Partial degenerative rupture of right biceps tendon 07/18/2017  . Right lateral epicondylitis 07/18/2017  . Hyperglycemia 04/18/2017  . High foot arch 03/29/2016  . Arthritis, midfoot 03/29/2016  . Tibialis posterior tendinitis 03/29/2016  . Left foot pain 03/28/2016  . Mitral regurgitation 09/05/2013  . Carotid artery disease without cerebral infarction ([place])   . Hx of CABG   . Ejection fraction   . Preventative health care 08/30/2012  . Atrial fibrillation ([place])   . Thrombocytopenia ([place]) 07/26/2009  . Headache(784.0) 07/15/2009  . Hyperlipidemia 02/09/2009  . Anxiety state 02/09/2009  . Depression 02/09/2009  . BENIGN PROSTATIC HYPERTROPHY 02/09/2009  . PSA, INCREASED 02/09/2009  . NEPHROLITHIASIS, HX OF 02/09/2009  . CAD (coronary artery disease) 07/11/2008    Past Surgical History:  Procedure Laterality Date  . CORONARY ARTERY BYPASS GRAFT  08/05/08   x5, LIMA to the LAD.  Marland Kitchen CORONARY STENT PLACEMENT  07/2008   2 BMS to RCA  . HEMORRHOID SURGERY    . HYDROCELE EXCISION    .  INCISIONAL HERNIA REPAIR  04/16/2006   Left flank incisional hernia repair with mesh.  Dr Harlow Asa  . INGUINAL HERNIA REPAIR N/A 02/11/2021   Procedure: LAPAROSCOPIC REPAIR OF  INCISIONAL RECURRENT LEFT FLANK HERNIA WITH MESH TAP BLOCK - BILATERAL;  Surgeon: Michael Boston, MD;  Location: [facility] ORS;  Service: General;  Laterality: N/A;  . left leg broken    . PROSTATE BIOPSY  2007   neg  . ROTATOR CUFF REPAIR     bilateral; Dr. Noemi Chapel       Family History  Problem Relation Age of Onset  . Lung cancer Mother   . Leukemia Father   . Healthy Brother   . Colon cancer Neg Hx   . Rectal cancer Neg Hx    . Stomach cancer Neg Hx     Social History   Tobacco Use  . Smoking status: Never Smoker  . Smokeless tobacco: Never Used  Vaping Use  . Vaping Use: Never used  Substance Use Topics  . Alcohol use: No  . Drug use: No    Home Medications Prior to Admission medications   Medication Sig Start Date End Date Taking? Authorizing Provider  acetaminophen (TYLENOL) 500 MG tablet Take 1,000 mg by mouth every 6 (six) hours as needed for mild pain.   Yes [provider]  aspirin 81 MG EC tablet Take 1 tablet (81 mg total) by mouth daily. Swallow whole. 09/06/12  Yes Biagio Borg, MD  Cholecalciferol (VITAMIN D) 50 MCG (2000 UT) CAPS Take 2,000 Units by mouth daily.   Yes [provider]  hydrocortisone (ANUSOL-HC) 25 MG suppository Place 1 suppository (25 mg total) rectally 2 (two) times daily. 02/18/21  Yes Saverio Danker, PA-C  methocarbamol (ROBAXIN) 750 MG tablet Take 1 tablet (750 mg total) by mouth 4 (four) times daily as needed (use for muscle cramps/pain). 02/18/21  Yes Michael Boston, MD  Multiple Vitamin (MULTIVITAMIN) tablet Take 1 tablet by mouth daily.   Yes [provider]  oxyCODONE (OXY IR/ROXICODONE) 5 MG immediate release tablet Take 1-2 tablets (5-10 mg total) by mouth every 6 (six) hours as needed for moderate pain, severe pain or breakthrough pain. 02/11/21  Yes Michael Boston, MD  simvastatin (ZOCOR) 40 MG tablet TAKE 1/2 (ONE-HALF) TABLET BY MOUTH AT BEDTIME Patient taking differently: Take 20 mg by mouth daily at 6 PM. 11/29/20  Yes Biagio Borg, MD  tamsulosin (FLOMAX) 0.4 MG CAPS capsule Take 1 capsule (0.4 mg total) by mouth daily for 10 days. 02/18/21 02/28/21 Yes Dorie Rank, MD  diazepam (VALIUM) 5 MG tablet Take 1 tablet (5 mg total) by mouth every 8 (eight) hours as needed for muscle spasms. 02/18/21   Michael Boston, MD    Allergies    Niacin  Review of Systems   Review of Systems  All other systems reviewed and are negative.   Physical  Exam Updated Vital Signs BP (!) 142/55   Pulse 88   Temp 97.6 F (36.4 C) (Oral)   Resp 18   Ht 1.626 m (5\' 4" )   Wt 61.7 kg   SpO2 95%   BMI 23.34 kg/m   Physical Exam Vitals and nursing note reviewed.  Constitutional:      Appearance: He is well-developed. He is ill-appearing. He is not diaphoretic.  HENT:     Head: Normocephalic and atraumatic.     Right Ear: External ear normal.     Left Ear: External ear normal.  Eyes:  General: No scleral icterus.       Right eye: No discharge.        Left eye: No discharge.     Conjunctiva/sclera: Conjunctivae normal.  Neck:     Trachea: No tracheal deviation.  Cardiovascular:     Rate and Rhythm: Normal rate and regular rhythm.  Pulmonary:     Effort: Pulmonary effort is normal. No respiratory distress.     Breath sounds: Normal breath sounds. No stridor. No wheezing or rales.  Abdominal:     General: Bowel sounds are normal. There is no distension.     Palpations: Abdomen is soft.     Tenderness: There is abdominal tenderness. There is no guarding or rebound.     Comments: Diffuse  Genitourinary:    Comments: No rectal mass, stool noted in the rectal vault but hard and impacted Musculoskeletal:        General: No tenderness.     Cervical back: Neck supple.  Skin:    General: Skin is warm and dry.     Findings: No rash.  Neurological:     Mental Status: He is alert.     Cranial Nerves: No cranial nerve deficit (no facial droop, extraocular movements intact, no slurred speech).     Sensory: No sensory deficit.     Motor: No abnormal muscle tone or seizure activity.     Coordination: Coordination normal.     ED Results / Procedures / Treatments   Labs (all labs ordered are listed, but only abnormal results are displayed) Labs Reviewed  COMPREHENSIVE METABOLIC PANEL - Abnormal; Notable for the following components:      Result Value   Glucose, Bld 192 (*)    All other components within normal limits  CBC WITH  DIFFERENTIAL/PLATELET - Abnormal; Notable for the following components:   Neutro Abs 7.8 (*)    All other components within normal limits  URINALYSIS, ROUTINE W REFLEX MICROSCOPIC - Abnormal; Notable for the following components:   Hgb urine dipstick SMALL (*)    Ketones, ur 5 (*)    Bacteria, UA RARE (*)    All other components within normal limits  LIPASE, BLOOD    EKG None  Radiology DG Abd Acute W/Chest  Result Date: 02/18/2021 CLINICAL DATA:  Abdominal pain and constipation. Recent left flank incisional hernia repair a week ago. EXAM: DG ABDOMEN ACUTE WITH 1 VIEW CHEST COMPARISON:  Abdominal x-ray dated December 15, 2020. Chest x-ray dated August 31, 2008. FINDINGS: The heart size and mediastinal contours are within normal limits. Prior CABG. Normal pulmonary vascularity. Probable nipple shadow overlying the right lung base. No focal consolidation, pleural effusion, or pneumothorax. There is a small amount of pneumoperitoneum under the left hemidiaphragm. The bowel gas pattern is normal with moderate colonic stool burden. Unchanged right renal calculi. Small amount of subcutaneous emphysema in the left flank. No acute osseous abnormality. IMPRESSION: 1. Small pneumoperitoneum under the left hemidiaphragm, likely related to recent surgery. Additional small volume postsurgical subcutaneous emphysema in the left flank. 2. Unchanged right nephrolithiasis. 3. No acute cardiopulmonary disease. Electronically Signed   By: Titus Dubin M.D.   On: 02/18/2021 08:30    Procedures Procedures   Medications Ordered in ED Medications  sorbitol, milk of mag, mineral oil, glycerin (SMOG) enema (has no administration in time range)  methocarbamol (ROBAXIN) tablet 1,000 mg (has no administration in time range)  methocarbamol (ROBAXIN) 1,000 mg in dextrose 5 % 100 mL IVPB (has no administration in time  range)  acetaminophen (TYLENOL) tablet 1,000 mg (has no administration in time range)  lip balm  (CARMEX) ointment 1 application (has no administration in time range)  magic mouthwash (has no administration in time range)  bisacodyl (DULCOLAX) suppository 10 mg (has no administration in time range)  polyethylene glycol (MIRALAX / GLYCOLAX) packet 17 g (has no administration in time range)  diazepam (VALIUM) tablet 5 mg (has no administration in time range)  ondansetron (ZOFRAN) injection 4 mg (4 mg Intravenous Given 02/18/21 0811)  sodium phosphate (FLEET) 7-19 GM/118ML enema 1 enema (1 enema Rectal Given 02/18/21 0815)  ondansetron (ZOFRAN) injection 4 mg (4 mg Intravenous Given 02/18/21 1001)  ketorolac (TORADOL) 30 MG/ML injection 15 mg (15 mg Intravenous Given 02/18/21 1057)  magnesium citrate solution 1 Bottle (1 Bottle Oral Given 02/18/21 1202)    ED Course  I have reviewed the triage vital signs and the nursing notes.  Pertinent labs & imaging results that were available during my care of the patient were reviewed by me and considered in my medical decision making (see chart for details).  Clinical Course as of 02/18/21 1440  Fri Feb 18, 2021  8527 CBC metabolic panel normal. [JK]  0912 X-rays reviewed.  Small amount of pneumoperitoneum felt to be related to recent surgery.  Moderate stool burden.  No findings of obstruction  [JK]  0940 Patient did have urinary retention.  He has greater than 600 cc of urine in the Foley catheter bag.  However patient still feels very uncomfortable.  Denies abdominal pain but does seem to have some tenderness in the lower abdomen [JK]  0940 Will consult with general surgery considering the complications he isexperiencing after his recent surgery [JK]  0950 Urinalysis without signs of infection [JK]  1100 Discussed with General surgery  recommend additional laxative medication.  Will come in see in the ED [JK]  1254 Pt seen by general surgery.  Will plan on additional enema and home po laxatives.  Will plan on dc with foley in place. [JK]     Clinical Course User Index [JK] Dorie Rank, MD   MDM Rules/Calculators/A&P                          Patient presents to the ED with complaints of abdominal pain constipation following recent hernia surgery.  Patient also noticed decreased urination.  He is tried on laxatives without relief.  Patient does have urinary retention he was treated with a Foley catheter however he still feels poorly.  He was given an enema in the ED without much relief.  X-ray shows no signs of obstruction but small amount of free air likely felt to be related to his recent surgery.  Suspect constipation, ileus, less likely obstruction or other acute surgical complication considering his persistent symptoms I will consult with the general surgery service.  Patient was seen by general surgery in the ED.  Patient symptoms seem to be related to his constipation but no signs of other acute surgical complication.  Patient also has urinary retention. Final Clinical Impression(s) / ED Diagnoses Final diagnoses:  Drug-induced constipation  Urinary retention    Rx / DC Orders ED Discharge Orders         Ordered    diazepam (VALIUM) 5 MG tablet  Every 8 hours PRN        02/18/21 1227    methocarbamol (ROBAXIN) 750 MG tablet  4 times daily PRN  02/18/21 1227    hydrocortisone (ANUSOL-HC) 25 MG suppository  2 times daily        02/18/21 1405    tamsulosin (FLOMAX) 0.4 MG CAPS capsule  Daily        02/18/21 1440           Dorie Rank, MD 02/18/21 1440

## 2021-02-18 NOTE — Progress Notes (Signed)
Subjective: This is a 80 yo male who is known to Dr. Johney Maine and underwent a laparoscopic repair of a recurrent left flank hernia with mesh on 02/11/21.  He had a BM on the Thursday prior to his surgery and has not had a BM since.  He was originally taking miralax daily at home after surgery as per the bottle instructions.  He then stopped this and started taking Colace to see if this would do any better.  He started having more pain throughout the week because of worsening constipation.  He denies any vomiting, but minimal nausea.  He didn't eat much yesterday and before that tried some broth and soup.  He called our office yesterday who instructed him to take 1 dose of miralax every 2 hours up to six doses.  He did this with still no results.  He then developed urinary retention and so presented to the ED today for evaluation.   He has since had a foley placed with clear yellow urine present.  He had a acute abdominal series that revealed no evidence of bowel obstruction or ileus.  He has a fairly significant stool burden.  He also has a small amount of free air noted that is expected after his surgery.  He has received a fleet enema since arriving with minima results.  He now complains of hemorrhoids as well from sitting on the toilet so much.  We have been asked to see him for further recommendations.  ROS: See above, otherwise other systems negative  Objective: Vital signs in last 24 hours: Temp:  [97.6 F (36.4 C)] 97.6 F (36.4 C) (03/11 0725) Pulse Rate:  [76-100] 96 (03/11 1247) Resp:  [18-20] 18 (03/11 1247) BP: (125-155)/(68-112) 151/112 (03/11 1247) SpO2:  [92 %-100 %] 100 % (03/11 1247) Weight:  [61.7 kg] 61.7 kg (03/11 0726)    Intake/Output from previous day: No intake/output data recorded. Intake/Output this shift: Total I/O In: -  Out: 600 [Urine:600]  PE: Gen: appears tired Heart: regular Lungs: CTAB Abd: soft, tender, no more than expected, incisions are c/d/i  with steri-strips in place, +BS Rectal: some external hemorrhoids noted.  No bleeding, no thrombosed hemorrhoids noted Gu: clear yellow urine noted.  Foley in place Psych: A&Ox3  Lab Results:  Recent Labs    02/18/21 0744  WBC 10.4  HGB 13.7  HCT 40.5  PLT 183   BMET Recent Labs    02/18/21 0744  NA 135  K 4.5  CL 98  CO2 24  GLUCOSE 192*  BUN 12  CREATININE 1.09  CALCIUM 9.5   PT/INR No results for input(s): LABPROT, INR in the last 72 hours. CMP     Component Value Date/Time   NA 135 02/18/2021 0744   NA 139 04/08/2020 0854   K 4.5 02/18/2021 0744   CL 98 02/18/2021 0744   CO2 24 02/18/2021 0744   GLUCOSE 192 (H) 02/18/2021 0744   BUN 12 02/18/2021 0744   BUN 14 04/08/2020 0854   CREATININE 1.09 02/18/2021 0744   CREATININE 1.18 11/08/2016 0947   CALCIUM 9.5 02/18/2021 0744   PROT 7.4 02/18/2021 0744   PROT 6.9 04/08/2020 0854   ALBUMIN 3.9 02/18/2021 0744   ALBUMIN 4.3 04/08/2020 0854   AST 29 02/18/2021 0744   ALT 20 02/18/2021 0744   ALKPHOS 43 02/18/2021 0744   BILITOT 1.2 02/18/2021 0744   BILITOT 0.6 04/08/2020 0854   GFRNONAA >60 02/18/2021 0744   GFRAA  73 04/08/2020 0854   Lipase     Component Value Date/Time   LIPASE 22 02/18/2021 0744       Studies/Results: DG Abd Acute W/Chest  Result Date: 02/18/2021 CLINICAL DATA:  Abdominal pain and constipation. Recent left flank incisional hernia repair a week ago. EXAM: DG ABDOMEN ACUTE WITH 1 VIEW CHEST COMPARISON:  Abdominal x-ray dated December 15, 2020. Chest x-ray dated August 31, 2008. FINDINGS: The heart size and mediastinal contours are within normal limits. Prior CABG. Normal pulmonary vascularity. Probable nipple shadow overlying the right lung base. No focal consolidation, pleural effusion, or pneumothorax. There is a small amount of pneumoperitoneum under the left hemidiaphragm. The bowel gas pattern is normal with moderate colonic stool burden. Unchanged right renal calculi. Small  amount of subcutaneous emphysema in the left flank. No acute osseous abnormality. IMPRESSION: 1. Small pneumoperitoneum under the left hemidiaphragm, likely related to recent surgery. Additional small volume postsurgical subcutaneous emphysema in the left flank. 2. Unchanged right nephrolithiasis. 3. No acute cardiopulmonary disease. Electronically Signed   By: Titus Dubin M.D.   On: 02/18/2021 08:30    Anti-infectives: Anti-infectives (From admission, onward)   None       Assessment/Plan POD 7, s/p laparoscopic repair of a recurrent left flank hernia with mesh, Dr. Johney Maine 02/11/21  Urinary retention - per EDP.  Agree with outpatient voiding trial with urology  Constipation The patient appears to have a significant amount of constipation.  We will proceed with trying to clear things up from below so oral medications have a better chance to work.  We have recommended a SMOG enema.  I have also ordered magnesium citrate as well.  His x-ray is reassuring.  The small amount of free air noted is not unexpected after this type of surgery.  His labs are reassuring as well.  Once he gets these and has results, it is recommended that he remain on some liquids for 48 hrs and then take miralax QID pending transition to diarrhea.  D/W Dr. Johney Maine.  He has also noticed some hemorrhoids.  These do not appear to be thrombosed, so typical treatment with ointment and sitz bathes are appropriate.  He has follow up with Dr. Johney Maine already arranged.  No acute needs for admission at this time.    LOS: 0 days    [name] , [facility] 02/18/2021, 1:02 PM Please see Amion for pager number during day hours 7:00am-4:30pm or 7:00am -11:30am on weekends

## 2021-02-18 NOTE — ED Notes (Signed)
Urine output 622ml

## 2021-02-23 DIAGNOSIS — N401 Enlarged prostate with lower urinary tract symptoms: Secondary | ICD-10-CM | POA: Diagnosis not present

## 2021-02-23 DIAGNOSIS — R330 Drug induced retention of urine: Secondary | ICD-10-CM | POA: Diagnosis not present

## 2021-02-25 DIAGNOSIS — R330 Drug induced retention of urine: Secondary | ICD-10-CM | POA: Diagnosis not present

## 2021-02-28 DIAGNOSIS — R330 Drug induced retention of urine: Secondary | ICD-10-CM | POA: Diagnosis not present

## 2021-03-09 ENCOUNTER — Other Ambulatory Visit: Payer: Self-pay | Admitting: Internal Medicine

## 2021-03-09 NOTE — Telephone Encounter (Signed)
Please refill as per office routine med refill policy (all routine meds refilled for 3 mo or monthly per pt preference up to one year from last visit, then month to month grace period for 3 mo, then further med refills will have to be denied)  

## 2021-03-11 DIAGNOSIS — R3912 Poor urinary stream: Secondary | ICD-10-CM | POA: Diagnosis not present

## 2021-06-08 DIAGNOSIS — N200 Calculus of kidney: Secondary | ICD-10-CM | POA: Insufficient documentation

## 2021-06-08 DIAGNOSIS — M62830 Muscle spasm of back: Secondary | ICD-10-CM | POA: Diagnosis not present

## 2021-06-08 DIAGNOSIS — J302 Other seasonal allergic rhinitis: Secondary | ICD-10-CM | POA: Diagnosis not present

## 2021-06-08 NOTE — Progress Notes (Signed)
I, [name], LAT, ATC, am serving as scribe for Dr. [name].  [name] is a 80 y.o. male who presents to [place] at [facility] today for back pain.  He was last seen by Dr. Georgina Snell on 04/09/20 for L ankle pain.  Today, pt reports L-sided low back pain x 2 months at location of his most recent laproscopic hernia repair.  He locates his pain to .  Of note, pt had a L hernia repair on 02/11/21.  He has a history of a large open left flank abdominal surgery over 30 years ago to address a kidney stone.  This subsequently caused a flank hernia that was addressed initially with mesh which failed and had to have a revision mesh hernia repair in March by Dr. Johney Maine.  He did reasonably well after the surgery but over the last 2 months he has had worsening pain in the left flank and low back and has been unable to return to normal activity including playing golf.  He has not had trial of physical therapy yet for this.  He has had some muscle relaxers which helped a little.  He has a follow-up appointment with his surgeon Dr. Johney Maine scheduled July 12.  Radiating pain: No Aggravating factors: lumbar flexion; trunk rotation; late in the day Treatments tried: heat; rest; Aspercreme w/ lidocaine; Blue Emu   Pertinent review of systems: No fevers or chills  Relevant historical information: Surgery history as noted above.  See op note attached below.   Exam:  BP 110/70 (BP Location: Right Arm, Patient Position: Sitting, Cuff Size: Normal)   Pulse 67   Ht 5\' 4"  (1.626 m)   Wt 133 lb 3.2 oz (60.4 kg)   SpO2 96%   BMI 22.86 kg/m  General: Well Developed, well nourished, and in no acute distress.   MSK: L-spine nontender midline.  Left lumbar paraspinal musculature tender palpation and rigid and spasm. Large mature appearing scar left abdomen and flank with mild bulging nontender. Normal lumbar motion. Some pain with lateral flexion. Lower extremity strength is  intact.    Lab and Radiology Results  EXAM: DG ABDOMEN ACUTE WITH 1 VIEW CHEST   COMPARISON:  Abdominal x-ray dated December 15, 2020. Chest x-ray dated August 31, 2008.   FINDINGS: The heart size and mediastinal contours are within normal limits. Prior CABG. Normal pulmonary vascularity. Probable nipple shadow overlying the right lung base. No focal consolidation, pleural effusion, or pneumothorax.   There is a small amount of pneumoperitoneum under the left hemidiaphragm. The bowel gas pattern is normal with moderate colonic stool burden. Unchanged right renal calculi. Small amount of subcutaneous emphysema in the left flank. No acute osseous abnormality.   IMPRESSION: 1. Small pneumoperitoneum under the left hemidiaphragm, likely related to recent surgery. Additional small volume postsurgical subcutaneous emphysema in the left flank. 2. Unchanged right nephrolithiasis. 3. No acute cardiopulmonary disease.     Electronically Signed   By: Titus Dubin M.D.   On: 02/18/2021 08:30 I, [name], personally (independently) visualized and performed the interpretation of the images attached in this note.     Assessment and Plan: 80 y.o. male with persistent left low back pain/flank pain.  This is occurring starting about 2 months after surgery and has been present for about 2 months now.  I do not think the pain is directly related to the surgery and does not represent a failure of the mesh or surgical injury or infection or  anything drastically wrong.  I believe his pain is because muscle spasm and core muscle weakness and poor postural control.  I think he would do quite well with physical therapy.  Plan for referral to PT at [facility] rehabilitation.  One of his goals is to return to golf and that should be a great fit for him.  He has been prescribed methocarbamol already which should be reasonably helpful especially at bedtime.  Heating pad may help as well.  Recheck  in 6 weeks or sooner if needed.  CC Dr. Johney Maine   PDMP not reviewed this encounter. Orders Placed This Encounter  Procedures   Ambulatory referral to Physical Therapy    Referral Priority:   Routine    Referral Type:   Physical Medicine    Referral Reason:   Specialty Services Required    Requested Specialty:   Physical Therapy    Number of Visits Requested:   1   No orders of the defined types were placed in this encounter.    Discussed warning signs or symptoms. Please see discharge instructions. Patient expresses understanding.   The above documentation has been reviewed and is accurate and complete [name], M.D.    Op Note by Michael Boston, MD at 02/11/2021 10:18 AM  Author: Michael Boston, MD Service: Surgery Author Type: Physician  Filed: 02/11/2021 11:07 AM Date of Service: 02/11/2021 10:18 AM Status: Signed  Editor: Michael Boston, MD (Physician)                                                                                                                       02/11/2021   PATIENT:  [name]  80 y.o. male   Patient Care Team: Biagio Borg, MD as PCP - General Stanford Breed, Denice Bors, MD as PCP - Cardiology (Cardiology) Michael Boston, MD as Consulting Physician (General Surgery) Irine Seal, MD as Attending Physician (Urology) Carlena Bjornstad, MD (Cardiology)   PRE-OPERATIVE DIAGNOSIS:  ventral incisional recurrent and incarcerated abdominal wall hernia, left inguinal hernia   POST-OPERATIVE DIAGNOSIS:  Recurrent incisional flank hernia    PROCEDURE:   LAPAROSCOPIC REPAIR OF  INCISIONAL RECURRENT LEFT FLANK HERNIA WITH MESH TAP BLOCK - BILATERAL   SURGEON:  [name], MD   ASSISTANT:  Lonia Skinner, MD, [facility] PGY-5   ANESTHESIA:      General   Nerve block provided with liposomal bupivacaine (Experel) mixed with 0.25% bupivacaine as a Bilateral TAP block x 61mL each side at the level of the transverse abdominis &  preperitoneal spaces along the flank at the anterior axillary line, from subcostal ridge to iliac crest under laparoscopic guidance    EBL:  Total I/O In: 1000 [I.V.:1000] Out: 71 [Blood:50]  Per anesthesia record   Delay start of Pharmacological VTE agent (>24hrs) due to surgical blood loss or risk of bleeding:  no   DRAINS: none   SPECIMEN:  No Specimen   DISPOSITION OF SPECIMEN:  N/A   COUNTS:  YES  PLAN OF CARE: Admit for overnight observation   PATIENT DISPOSITION:  PACU - hemodynamically stable.   INDICATION: Pleasant patient has developed a ventral wall abdominal hernia.  History of prior left kidney surgery with flank hernia status post open primary repair with Swiss cheese recurrence containing colon.  Recommendation was made for surgical repair   The anatomy & physiology of the abdominal wall was discussed. The pathophysiology of hernias was discussed. Natural history risks without surgery including progeressive enlargement, pain, incarceration & strangulation was discussed. Contributors to complications such as smoking, obesity, diabetes, prior surgery, etc were discussed. I feel the risks of no intervention will lead to serious problems that outweigh the operative risks; therefore, I recommended surgery to reduce and repair the hernia. I explained laparoscopic techniques with possible need for an open approach. I noted the probable use of mesh to patch and/or buttress the hernia repair.   Risks such as bleeding, infection, abscess, need for further treatment, heart attack, death, and other risks were discussed. I noted a good likelihood this will help address the problem. Goals of post-operative recovery were discussed as well. Possibility that this will not correct all symptoms was explained. I stressed the importance of low-impact activity, aggressive pain control, avoiding constipation, & not pushing through pain to minimize risk of post-operative chronic pain or injury.  Possibility of reherniation especially with smoking, obesity, diabetes, immunosuppression, and other health conditions was discussed. We will work to minimize complications.   An educational handout further explaining the pathology & treatment options was given as well. Questions were answered. The patient expresses understanding & wishes to proceed with surgery.   OR FINDINGS: 15 x 6 cm Swiss cheese region along the left flank with moderate diastases bowing at old flank incision.  Moderate omental adhesions but no strangulation.   Type of repair: Laparoscopic underlay repair.  Primary repair of part of hernia              Placement of mesh: Retroperitoneal & intraperitoneal   Name of mesh: Bard Ventralight dual sided (polypropylene / Seprafilm)   Size of mesh: 33x27cm   Orientation: Transverse   Mesh overlap:  5-7cm     DESCRIPTION:    Informed consent was confirmed. The patient underwent general anaesthesia without difficulty. The patient was positioned appropriately left side up decubitus in flex with careful protection of extremities. VTE prevention in place. The patient's abdomen was clipped, prepped, & draped in a sterile fashion. Surgical timeout confirmed our plan.  The patient was positioned in reverse Trendelenburg. Abdominal entry was gained using Varess & then optical entry technique in the left upper abdomen. Entry was clean. I induced carbon dioxide insufflation. Camera inspection revealed no injury. Extra ports were carefully placed under direct laparoscopic visualization.   I could see adhesions on the parietal peritoneum under the abdominal wall.   I did laparoscopic lysis of adhesions to expose the entire anterior abdominal wall.  Freed off greater omentum from the midline and left side.  I then mobilized the left colon and left kidney in a lateral to medial fashion to the left posterior retroperitoneum.  Saw the left ureter in its course and rotated that more medially.  I  primarily used focused sharp dissection.  I made sure hemostasis was good.  Patient had moderate diastases and bowing of the Swiss cheese hernia of a moderate region.  He had concerns about old stitches causing pain so I removed the majority him along the left posterior lateral costal ridge  where he had noted some occasional pains.  I mapped out the region using a needle passer.   To ensure that I would have at least 5 cm radial coverage outside of the hernia defect, I chose a 33x27cm dual sided mesh.  I placed #1 Prolene stitches around the left posterior lateral paraspinal border x2 and 4 on the medial edge x4 = 6 total.  I rolled the mesh & placed into the peritoneal cavity through one of the Swiss cheese hernias around the mid axillary line.  I unrolled the mesh and positioned it appropriately.   We then used a spinal needle to help map out the region and used a suture passer to secure the mesh on the left retroperitoneum x2.  I then brought up the medial corners along the left anterior paramedian abdomen.  This help center the mesh for good control.  We then brought out the remaining sutures on the left paraspinal retroperitoneal sutures and tied those down.  I then used a secure strap tacker to help tack the mesh along the retroperitoneum, iliac crest, left costal ridge, came more medially.  Brought the 4 sutures on the medial and closer to the left anterior paramedian region up using laparoscopic suture guidance.  Because this was a giant mesh I also had #1 Prolene was passed through the center of the mesh between iliac crest and left subcostal ridge superior and inferior to the hernias.  Did 5 central sutures.  We then used #1 PDS to close the hernia defect that we have passed the mesh through using laparoscopic suture passer x2.  This allowed the mesh to be centrally tacked in 4 locations with good transfascial sutures.  Of note we used a few of those transfacial sutures to help tack the left colon back  along the line of Toldt by taking some good bites of tissue and epiploic appendages, taking care to avoid injuring the colon.   After this the mesh laid well and had good central and paraspinal and anterior paramedian fascial securing.  SecureStrap absorbable tacks around the edges and centrally.  Brought greater omentum to help cover up the region again.  Hemostasis was good.  Mesh laid well. I completed a broad field block of local anesthesia at fascial stitch sites & fascial closure areas.     Capnoperitoneum was evacuated. Ports were removed. The skin was closed with Monocryl at the port sites and Steri-Strips on the fascial stitch puncture sites.  Patient is being extubated to go to the recovery room.  I discussed operative findings, updated the patient's status, discussed probable steps to recovery, and gave postoperative recommendations to the patient's spouse.  Recommendations were made.  Questions were answered.  She expressed understanding & appreciation.  [name], M.D., F.A.C.S. Gastrointestinal and Minimally Invasive Surgery [facility] [address] [place], [place] [id number] [phone] Main / Paging   02/11/2021 10:18 AM

## 2021-06-09 ENCOUNTER — Encounter: Payer: Self-pay | Admitting: Family Medicine

## 2021-06-09 ENCOUNTER — Other Ambulatory Visit: Payer: Self-pay

## 2021-06-09 ENCOUNTER — Ambulatory Visit (INDEPENDENT_AMBULATORY_CARE_PROVIDER_SITE_OTHER): Payer: Medicare Other | Admitting: Family Medicine

## 2021-06-09 VITALS — BP 110/70 | HR 67 | Ht 64.0 in | Wt 133.2 lb

## 2021-06-09 DIAGNOSIS — I2510 Atherosclerotic heart disease of native coronary artery without angina pectoris: Secondary | ICD-10-CM | POA: Diagnosis not present

## 2021-06-09 DIAGNOSIS — M6281 Muscle weakness (generalized): Secondary | ICD-10-CM

## 2021-06-09 DIAGNOSIS — R109 Unspecified abdominal pain: Secondary | ICD-10-CM | POA: Diagnosis not present

## 2021-06-09 NOTE — Patient Instructions (Addendum)
Thank you for coming in today.   I've referred you to Physical Therapy.  Let us know if you don't hear from them in one week.   Use the muscle relaxer as needed.   Recheck in about 6 weeks.   Follow up as schedule with Dr Johney Maine.   Let me know if you have a problem.

## 2021-06-20 ENCOUNTER — Telehealth: Payer: Self-pay | Admitting: Internal Medicine

## 2021-06-20 NOTE — Telephone Encounter (Signed)
Please refill as per office routine med refill policy (all routine meds refilled for 3 mo or monthly per pt preference up to one year from last visit, then month to month grace period for 3 mo, then further med refills will have to be denied)  

## 2021-06-23 ENCOUNTER — Other Ambulatory Visit: Payer: Self-pay | Admitting: Internal Medicine

## 2021-06-24 MED ORDER — SIMVASTATIN 40 MG PO TABS: ORAL_TABLET | ORAL | 0 refills | Status: DC

## 2021-06-24 NOTE — Telephone Encounter (Signed)
Pt notified that short supply sent & verb understanding.

## 2021-06-24 NOTE — Telephone Encounter (Signed)
  Patient is requesting a short supply of  simvastatin (ZOCOR) 40 MG tablet  [facility], [address]   Patient has an appointment on 7/20. He is requesting a call back and can be reached at [phone]

## 2021-06-28 DIAGNOSIS — R1084 Generalized abdominal pain: Secondary | ICD-10-CM | POA: Diagnosis not present

## 2021-06-28 DIAGNOSIS — Z9889 Other specified postprocedural states: Secondary | ICD-10-CM | POA: Diagnosis not present

## 2021-06-28 DIAGNOSIS — Z8719 Personal history of other diseases of the digestive system: Secondary | ICD-10-CM | POA: Diagnosis not present

## 2021-06-29 ENCOUNTER — Other Ambulatory Visit: Payer: Self-pay

## 2021-06-29 ENCOUNTER — Ambulatory Visit (INDEPENDENT_AMBULATORY_CARE_PROVIDER_SITE_OTHER): Payer: Medicare Other | Admitting: Internal Medicine

## 2021-06-29 VITALS — BP 128/74 | HR 62 | Temp 98.3°F | Ht 64.0 in | Wt 136.2 lb

## 2021-06-29 DIAGNOSIS — E559 Vitamin D deficiency, unspecified: Secondary | ICD-10-CM

## 2021-06-29 DIAGNOSIS — E7800 Pure hypercholesterolemia, unspecified: Secondary | ICD-10-CM

## 2021-06-29 DIAGNOSIS — F32A Depression, unspecified: Secondary | ICD-10-CM | POA: Diagnosis not present

## 2021-06-29 DIAGNOSIS — R109 Unspecified abdominal pain: Secondary | ICD-10-CM

## 2021-06-29 DIAGNOSIS — R739 Hyperglycemia, unspecified: Secondary | ICD-10-CM

## 2021-06-29 DIAGNOSIS — I2510 Atherosclerotic heart disease of native coronary artery without angina pectoris: Secondary | ICD-10-CM | POA: Diagnosis not present

## 2021-06-29 DIAGNOSIS — N320 Bladder-neck obstruction: Secondary | ICD-10-CM | POA: Diagnosis not present

## 2021-06-29 DIAGNOSIS — G8929 Other chronic pain: Secondary | ICD-10-CM | POA: Diagnosis not present

## 2021-06-29 DIAGNOSIS — E538 Deficiency of other specified B group vitamins: Secondary | ICD-10-CM | POA: Diagnosis not present

## 2021-06-29 DIAGNOSIS — R10A2 Flank pain, left side: Secondary | ICD-10-CM

## 2021-06-29 LAB — BASIC METABOLIC PANEL
BUN: 17 mg/dL (ref 6–23)
CO2: 30 mEq/L (ref 19–32)
Calcium: 9.6 mg/dL (ref 8.4–10.5)
Chloride: 102 mEq/L (ref 96–112)
Creatinine, Ser: 1.1 mg/dL (ref 0.40–1.50)
GFR: 63.91 mL/min (ref >60.00)
Glucose, Bld: 114 mg/dL — ABNORMAL HIGH (ref 70–99)
Potassium: 4.7 mEq/L (ref 3.5–5.1)
Sodium: 139 mEq/L (ref 135–145)

## 2021-06-29 LAB — URINALYSIS, ROUTINE W REFLEX MICROSCOPIC
Bilirubin Urine: NEGATIVE
Hgb urine dipstick: NEGATIVE
Ketones, ur: NEGATIVE
Leukocytes,Ua: NEGATIVE
Nitrite: NEGATIVE
RBC / HPF: NONE SEEN (ref 0–?)
Specific Gravity, Urine: 1.01 (ref 1.000–1.030)
Total Protein, Urine: NEGATIVE
Urine Glucose: NEGATIVE
Urobilinogen, UA: 0.2 (ref 0.0–1.0)
WBC, UA: NONE SEEN (ref 0–?)
pH: 5.5 (ref 5.0–8.0)

## 2021-06-29 LAB — HEPATIC FUNCTION PANEL
ALT: 15 U/L (ref 0–53)
AST: 23 U/L (ref 0–37)
Albumin: 4.4 g/dL (ref 3.5–5.2)
Alkaline Phosphatase: 48 U/L (ref 39–117)
Bilirubin, Direct: 0.1 mg/dL (ref 0.0–0.3)
Total Bilirubin: 0.6 mg/dL (ref 0.2–1.2)
Total Protein: 7.1 g/dL (ref 6.0–8.3)

## 2021-06-29 LAB — LIPID PANEL
Cholesterol: 158 mg/dL (ref 0–200)
HDL: 43.4 mg/dL (ref >39.00)
LDL Cholesterol: 76 mg/dL (ref 0–99)
NonHDL: 114.55
Total CHOL/HDL Ratio: 4
Triglycerides: 192 mg/dL — ABNORMAL HIGH (ref 0.0–149.0)
VLDL: 38.4 mg/dL (ref 0.0–40.0)

## 2021-06-29 LAB — CBC WITH DIFFERENTIAL/PLATELET
Basophils Absolute: 0 10*3/uL (ref 0.0–0.1)
Basophils Relative: 0.3 % (ref 0.0–3.0)
Eosinophils Absolute: 0.2 10*3/uL (ref 0.0–0.7)
Eosinophils Relative: 3 % (ref 0.0–5.0)
HCT: 43.8 % (ref 39.0–52.0)
Hemoglobin: 15 g/dL (ref 13.0–17.0)
Lymphocytes Relative: 36.4 % (ref 12.0–46.0)
Lymphs Abs: 2.8 10*3/uL (ref 0.7–4.0)
MCHC: 34.4 g/dL (ref 30.0–36.0)
MCV: 88.8 fl (ref 78.0–100.0)
Monocytes Absolute: 0.7 10*3/uL (ref 0.1–1.0)
Monocytes Relative: 9.5 % (ref 3.0–12.0)
Neutro Abs: 3.9 10*3/uL (ref 1.4–7.7)
Neutrophils Relative %: 50.8 % (ref 43.0–77.0)
Platelets: 132 10*3/uL — ABNORMAL LOW (ref 150.0–400.0)
RBC: 4.93 Mil/uL (ref 4.22–5.81)
RDW: 12.6 % (ref 11.5–15.5)
WBC: 7.7 10*3/uL (ref 4.0–10.5)

## 2021-06-29 LAB — HEMOGLOBIN A1C: Hgb A1c MFr Bld: 6.3 % (ref 4.6–6.5)

## 2021-06-29 MED ORDER — SIMVASTATIN 40 MG PO TABS: ORAL_TABLET | ORAL | 3 refills | Status: DC

## 2021-06-29 NOTE — Progress Notes (Signed)
Patient ID: [name], male   DOB: [birth year], 80 y.o.   MRN: [id number]         Chief Complaint:: yearly exam       HPI:  [name] is a 80 y.o. male here with c/o left sided pain uncontrolled, and worse per pt since attempted surgical repair.  Pt is S/p third surgical intervention with mesh placement for lef sided wound hernia.  Still painful x 5 mo, did see urology yesterday with ongoing pain, not clear if going to resolve, given gabapentin for pain.  Has not thought about pain management.  He cant play golf, and currently cannot do this, frustrated.  Has not yet tried the gabapentin, not sure that was a good idea.  Pt denies chest pain, increased sob or doe, wheezing, orthopnea, PND, increased LE swelling, palpitations, dizziness or syncope.   Pt denies polydipsia, polyuria, or new focal neuro s/s.   Pt denies fever, wt loss, night sweats, loss of appetite, or other constitutional symptoms  No other new complaints   Denies worsening depressive symptoms, suicidal ideation, or panic;  Wt Readings from Last 3 Encounters:  06/29/21 136 lb 3.2 oz (61.8 kg)  06/09/21 133 lb 3.2 oz (60.4 kg)  02/18/21 136 lb (61.7 kg)   BP Readings from Last 3 Encounters:  06/29/21 128/74  06/09/21 110/70  02/18/21 130/60   Immunization History  Administered Date(s) Administered   Fluad Quad(high Dose 65+) 08/14/2019   Influenza, High Dose Seasonal PF 10/04/2017, 08/30/2018   Influenza,inj,Quad PF,6+ Mos 09/12/2013, 01/14/2015   PFIZER(Purple Top)SARS-COV-2 Vaccination 02/08/2020, 03/09/2020   Pneumococcal Conjugate-13 09/12/2013   Pneumococcal Polysaccharide-23 08/31/2008   Td 02/09/2009   Zoster, Live 02/09/2009   There are no preventive care reminders to display for this patient.     Past Medical History:  Diagnosis Date   Arthritis    Atrial fibrillation ([place])    post MI-amiodarone stopped 07/2008   Benign prostatic hypertrophy    CAD (coronary artery disease) 07/2008   2 BMS placed for  MI, August, 2009   Carotid artery disease without cerebral infarction [facility])    CORONARY ARTERY BYPASS GRAFT, HX OF 07/13/2009   August, 2009, Dr. Cyndia Bent   Ejection fraction    .   History of kidney stones    History of nephrolithiasis    Hx of CABG    Hx of colonoscopy    Hyperlipidemia    HYPERLIPIDEMIA 02/09/2009   Qualifier: Diagnosis of  By: Jenny Reichmann MD, Hunt Oris    Increased prostate specific antigen (PSA) velocity    Myocardial infarction ([place])    NEPHROLITHIASIS, HX OF 02/09/2009   Qualifier: Diagnosis of  By: Jenny Reichmann MD, Hunt Oris    Prostatitis    PSA elevation 09/06/2012   Thrombocytopenia [facility])    Past Surgical History:  Procedure Laterality Date   CORONARY ARTERY BYPASS GRAFT  08/05/08   x5, [place] to the LAD.    CORONARY STENT PLACEMENT  07/2008   2 BMS to RCA   [place]  04/16/2006   Left flank incisional hernia repair with mesh.  Dr Lonzo Cloud HERNIA REPAIR N/A 02/11/2021   Procedure: LAPAROSCOPIC REPAIR OF  INCISIONAL RECURRENT LEFT FLANK HERNIA WITH MESH TAP BLOCK - BILATERAL;  Surgeon: Michael Boston, MD;  Location: [facility];  Service: General;  Laterality: N/A;   left leg broken     PROSTATE BIOPSY  2007   neg   ROTATOR CUFF REPAIR     bilateral; Dr. Noemi Chapel    reports that he has never smoked. He has never used smokeless tobacco. He reports that he does not drink alcohol and does not use drugs. family history includes Healthy in his brother; Leukemia in his father; Lung cancer in his mother. Allergies  Allergen Reactions   Niacin Itching    Leg aches and itching   Current Outpatient Medications on File Prior to Visit  Medication Sig Dispense Refill   acetaminophen (TYLENOL) 500 MG tablet Take 1,000 mg by mouth every 6 (six) hours as needed for mild pain.     aspirin 81 MG EC tablet Take 1 tablet (81 mg total) by mouth daily. Swallow whole. 30 tablet 12   Baclofen 5 MG TABS Take 1 tablet by mouth 2 (two)  times daily as needed.     Cholecalciferol (VITAMIN D) 50 MCG (2000 UT) CAPS Take 2,000 Units by mouth daily.     diazepam (VALIUM) 5 MG tablet Take 1 tablet (5 mg total) by mouth every 8 (eight) hours as needed for muscle spasms. 20 tablet 0   EC-NAPROXEN 500 MG EC tablet Take 500 mg by mouth 2 (two) times daily as needed.     gabapentin (NEURONTIN) 300 MG capsule Take by mouth.     hydrocortisone (ANUSOL-HC) 25 MG suppository Place 1 suppository (25 mg total) rectally 2 (two) times daily. 12 suppository 0   methocarbamol (ROBAXIN) 750 MG tablet Take 1 tablet (750 mg total) by mouth 4 (four) times daily as needed (use for muscle cramps/pain). 30 tablet 2   Multiple Vitamin (MULTIVITAMIN) tablet Take 1 tablet by mouth daily.     oxyCODONE (OXY IR/ROXICODONE) 5 MG immediate release tablet Take 1-2 tablets (5-10 mg total) by mouth every 6 (six) hours as needed for moderate pain, severe pain or breakthrough pain. 30 tablet 0   traMADol (ULTRAM) 50 MG tablet Take by mouth every 6 (six) hours as needed.     No current facility-administered medications on file prior to visit.        ROS:  All others reviewed and negative.  Objective        PE:  BP 128/74 (BP Location: Left Arm, Patient Position: Sitting, Cuff Size: Normal)   Pulse 62   Temp 98.3 F (36.8 C) (Oral)   Ht 5\' 4"  (1.626 m)   Wt 136 lb 3.2 oz (61.8 kg)   SpO2 96%   BMI 23.38 kg/m                 Constitutional: Pt appears in NAD               HENT: Head: NCAT.                Right Ear: External ear normal.                 Left Ear: External ear normal.                Eyes: . Pupils are equal, round, and reactive to light. Conjunctivae and EOM are normal               Nose: without d/c or deformity               Neck: Neck supple. Gross normal ROM               Cardiovascular: Normal rate and regular rhythm.  Pulmonary/Chest: Effort normal and breath sounds without rales or wheezing.                Abd:  Soft,  NT, ND, + BS, no organomegaly               Neurological: Pt is alert. At baseline orientation, motor grossly intact               Skin: Skin is warm. No rashes, no other new lesions, LE edema - none               Psychiatric: Pt behavior is normal without agitation ; somewhat depressed affect, frustrated  Micro: none  Cardiac tracings I have personally interpreted today:  none  Pertinent Radiological findings (summarize): none   Lab Results  Component Value Date   WBC 7.7 06/29/2021   HGB 15.0 06/29/2021   HCT 43.8 06/29/2021   PLT 132.0 (L) 06/29/2021   GLUCOSE 114 (H) 06/29/2021   CHOL 158 06/29/2021   TRIG 192.0 (H) 06/29/2021   HDL 43.40 06/29/2021   LDLCALC 76 06/29/2021   ALT 15 06/29/2021   AST 23 06/29/2021   NA 139 06/29/2021   K 4.7 06/29/2021   CL 102 06/29/2021   CREATININE 1.10 06/29/2021   BUN 17 06/29/2021   CO2 30 06/29/2021   TSH 3.51 06/29/2021   PSA 6.56 (H) 06/29/2021   HGBA1C 6.3 06/29/2021   Assessment/Plan:  [name] is a 80 y.o. White or Caucasian [1] male with  has a past medical history of Arthritis, Atrial fibrillation ([place]), Benign prostatic hypertrophy, CAD (coronary artery disease) (07/2008), Carotid artery disease without cerebral infarction [facility]), CORONARY ARTERY BYPASS GRAFT, HX OF (07/13/2009), Ejection fraction, History of kidney stones, History of nephrolithiasis, CABG, colonoscopy, Hyperlipidemia, HYPERLIPIDEMIA (02/09/2009), Increased prostate specific antigen (PSA) velocity, Myocardial infarction ([place]), NEPHROLITHIASIS, HX OF (02/09/2009), Prostatitis, PSA elevation (09/06/2012), and Thrombocytopenia ([place]).  Hyperlipidemia Lab Results  Component Value Date   LDLCALC 76 06/29/2021   Uncontrolled, goal ldl < 70 pt to continue current zocor 40 as declines further change today, and lower chol diet   Hyperglycemia Lab Results  Component Value Date   HGBA1C 6.3 06/29/2021   Stable, pt to continue current medical treatment  -  diet   Chronic left flank pain Uncontrolled, post surgical, pt urged to try the gabapentin as rx, but to also consider pain management for nerve ablation if not successful  Depression Stable overall, declines need for change in tx at this time  Followup: Return in about 6 months (around 12/30/2021), or if symptoms worsen or fail to improve.  Cathlean Cower, MD 07/03/2021 7:32 PM [facility]

## 2021-06-29 NOTE — Patient Instructions (Signed)
Ok to try the gabapentin as you have been prescribed  Please let us know if at any point you would want a referral to pain management to consider nerve ablation to get rid of the pain  Please continue all other medications as before, and refills have been done if requested.  Please have the pharmacy call with any other refills you may need.  Please continue your efforts at being more active, low cholesterol diet, and weight control.  You are otherwise up to date with prevention measures today.  Please keep your appointments with your specialists as you may have planned  Please go to the LAB at the blood drawing area for the tests to be done  You will be contacted by phone if any changes need to be made immediately.  Otherwise, you will receive a letter about your results with an explanation, but please check with MyChart first.  Please remember to sign up for MyChart if you have not done so, as this will be important to you in the future with finding out test results, communicating by private email, and scheduling acute appointments online when needed.  Please make an Appointment to return in 6 months, or sooner if needed

## 2021-06-30 ENCOUNTER — Encounter: Payer: Self-pay | Admitting: Internal Medicine

## 2021-06-30 DIAGNOSIS — R109 Unspecified abdominal pain: Secondary | ICD-10-CM | POA: Diagnosis not present

## 2021-06-30 DIAGNOSIS — M6281 Muscle weakness (generalized): Secondary | ICD-10-CM | POA: Diagnosis not present

## 2021-06-30 LAB — VITAMIN D 25 HYDROXY (VIT D DEFICIENCY, FRACTURES): VITD: 61.96 ng/mL (ref 30.00–100.00)

## 2021-06-30 LAB — TSH: TSH: 3.51 u[IU]/mL (ref 0.35–5.50)

## 2021-06-30 LAB — VITAMIN B12: Vitamin B-12: 313 pg/mL (ref 211–911)

## 2021-06-30 LAB — PSA: PSA: 6.56 ng/mL — ABNORMAL HIGH (ref 0.10–4.00)

## 2021-07-03 ENCOUNTER — Encounter: Payer: Self-pay | Admitting: Internal Medicine

## 2021-07-03 NOTE — Assessment & Plan Note (Signed)
Uncontrolled, post surgical, pt urged to try the gabapentin as rx, but to also consider pain management for nerve ablation if not successful

## 2021-07-03 NOTE — Assessment & Plan Note (Signed)
Lab Results  Component Value Date   LDLCALC 76 06/29/2021   Uncontrolled, goal ldl < 70 pt to continue current zocor 40 as declines further change today, and lower chol diet

## 2021-07-03 NOTE — Assessment & Plan Note (Signed)
Lab Results  Component Value Date   HGBA1C 6.3 06/29/2021   Stable, pt to continue current medical treatment  - diet

## 2021-07-03 NOTE — Assessment & Plan Note (Signed)
Stable overall, declines need for change in tx at this time

## 2021-07-06 DIAGNOSIS — R109 Unspecified abdominal pain: Secondary | ICD-10-CM | POA: Diagnosis not present

## 2021-07-06 DIAGNOSIS — M6281 Muscle weakness (generalized): Secondary | ICD-10-CM | POA: Diagnosis not present

## 2021-07-07 ENCOUNTER — Telehealth: Payer: Self-pay | Admitting: Family Medicine

## 2021-07-07 DIAGNOSIS — M545 Low back pain, unspecified: Secondary | ICD-10-CM

## 2021-07-07 NOTE — Telephone Encounter (Signed)
Pt started PT and was feeling good until this week. Thinks PT is too much and he has taken a step back and pain has increased. Patient states that the Physical Therapist is concerned about continuing and recommended pt see a DO and get an MRI.  Pt also stated he was interested in [place] doing an ultrasound here, but was unsure how Dr. Georgina Snell felt about that.  Advised patient that I would ask Dr. Georgina Snell if he would agree to order an MRI or what alternatives he has without PT.

## 2021-07-11 ENCOUNTER — Encounter: Payer: Self-pay | Admitting: Dermatology

## 2021-07-11 ENCOUNTER — Ambulatory Visit (INDEPENDENT_AMBULATORY_CARE_PROVIDER_SITE_OTHER): Payer: Medicare Other | Admitting: Dermatology

## 2021-07-11 ENCOUNTER — Other Ambulatory Visit: Payer: Self-pay

## 2021-07-11 DIAGNOSIS — Z1283 Encounter for screening for malignant neoplasm of skin: Secondary | ICD-10-CM

## 2021-07-11 DIAGNOSIS — D1801 Hemangioma of skin and subcutaneous tissue: Secondary | ICD-10-CM

## 2021-07-11 DIAGNOSIS — L309 Dermatitis, unspecified: Secondary | ICD-10-CM | POA: Diagnosis not present

## 2021-07-11 DIAGNOSIS — L570 Actinic keratosis: Secondary | ICD-10-CM

## 2021-07-11 DIAGNOSIS — I2510 Atherosclerotic heart disease of native coronary artery without angina pectoris: Secondary | ICD-10-CM | POA: Diagnosis not present

## 2021-07-11 DIAGNOSIS — L821 Other seborrheic keratosis: Secondary | ICD-10-CM | POA: Diagnosis not present

## 2021-07-11 NOTE — Telephone Encounter (Signed)
MRI lumbar spine is probably the next imaging step. Would you like me to order it?

## 2021-07-11 NOTE — Telephone Encounter (Signed)
Left pt a VM inquiring if she would like [place] to order an L-spine MRI and asked pt to call the office to let us know how to proceed.

## 2021-07-11 NOTE — Telephone Encounter (Signed)
Patient informed of message below and wants to proceed with L-spine MRI this has been ordered and no pre cert required. Patient given number to call tomorrow to schedule.

## 2021-07-11 NOTE — Patient Instructions (Signed)
Hydrocortisone cream OTC apply to beard x 3 weeks

## 2021-07-17 ENCOUNTER — Other Ambulatory Visit: Payer: Self-pay

## 2021-07-17 ENCOUNTER — Ambulatory Visit
Admission: RE | Admit: 2021-07-17 | Discharge: 2021-07-17 | Disposition: A | Discharge status: Home / Self Care | Payer: Medicare Other | Source: Ambulatory Visit | Attending: Family Medicine | Admitting: Family Medicine

## 2021-07-17 DIAGNOSIS — M545 Low back pain, unspecified: Secondary | ICD-10-CM | POA: Diagnosis not present

## 2021-07-18 NOTE — Progress Notes (Signed)
MRI lumbar spine does show a problem with the lumbar spine itself.  You do have bilateral pars defects that allow the spine to shift and cause anterior listhesis at L5 and S1 which can cause back pain.  There are also some arthritis changes in and around the spine that can cause back pain itself.  Otherwise things look basically okay.  Recommend return to clinic in the near future to go over the results in full detail and discuss what the next steps might be.

## 2021-07-21 ENCOUNTER — Encounter: Payer: Self-pay | Admitting: Family Medicine

## 2021-07-21 ENCOUNTER — Ambulatory Visit (INDEPENDENT_AMBULATORY_CARE_PROVIDER_SITE_OTHER): Payer: Medicare Other | Admitting: Family Medicine

## 2021-07-21 ENCOUNTER — Other Ambulatory Visit: Payer: Self-pay

## 2021-07-21 VITALS — BP 124/64 | HR 74 | Ht 64.0 in | Wt 135.4 lb

## 2021-07-21 DIAGNOSIS — R109 Unspecified abdominal pain: Secondary | ICD-10-CM | POA: Diagnosis not present

## 2021-07-21 DIAGNOSIS — I2510 Atherosclerotic heart disease of native coronary artery without angina pectoris: Secondary | ICD-10-CM

## 2021-07-21 DIAGNOSIS — M545 Low back pain, unspecified: Secondary | ICD-10-CM

## 2021-07-21 NOTE — Progress Notes (Signed)
I, [name], LAT, ATC, am serving as scribe for Dr. [name].  [name] is a 80 y.o. male who presents to [place] at [facility] today for f/u of L-sided LBP/flank pain in the location of a prior L flank hernia repair.  He was last seen by Dr. Georgina Snell on 06/09/21 and was referred to [name].  He has completed x visits and was initially improving until late July when he felt like he was over-doing things at PT.  He asked for an L-spine MRI that he had on 07/17/21 and is here today to review these results.  He reports that his symptoms are currently about the same.  Diagnostic testing: L-spine MRI- 07/17/21;   Pertinent review of systems: No fevers or chills  Relevant historical information: History of large and abdominal surgery   Exam:  BP 124/64 (BP Location: Right Arm, Patient Position: Sitting, Cuff Size: Normal)   Pulse 74   Ht '5\' 4"'$  (1.626 m)   Wt 135 lb 6.4 oz (61.4 kg)   SpO2 96%   BMI 23.24 kg/m  General: Well Developed, well nourished, and in no acute distress.   MSK: Lspine:  Nontender midline.  Tender palpation left lumbar paraspinal musculature TTP along posterior aspect of scar left lower abdomen and flank.  Decreased lumbar ROM to lateral flexion especially to the right.     Lab and Radiology Results No results found for this or any previous visit (from the past 72 hour(s)). MR Lumbar Spine Wo Contrast  Result Date: 07/18/2021 CLINICAL DATA:  Acute low back pain, unspecified back pain laterality, unspecified whether sciatica present; low back pain; technologist note states pain is at incision EXAM: MRI LUMBAR SPINE WITHOUT CONTRAST TECHNIQUE: Multiplanar, multisequence MR imaging of the lumbar spine was performed. No intravenous contrast was administered. COMPARISON:  None. FINDINGS: Segmentation:  Standard. Alignment: Trace anterolisthesis at L5-S1 likely secondary to chronic bilateral pars breaks. Vertebrae: Vertebral body  heights are maintained. There is no significant marrow edema. There is no suspicious osseous lesion. Conus medullaris and cauda equina: Conus extends to the L1-L2 level. Conus and cauda equina appear normal. Paraspinal and other soft tissues: Skin marker overlies the posterior left lateral soft tissues at the L2 level. This is partially imaged on the axial series. On the coronal series, there is some heterogeneous signal presumably reflecting postoperative changes. No well-defined fluid collection. Disc levels: Intervertebral disc heights are maintained. L1-L2:  No canal or foraminal stenosis. L2-L3:  No canal or foraminal stenosis. L3-L4: Mild facet arthropathy with ligamentum flavum infolding. No canal or foraminal stenosis. L4-L5: Punctate central annular fissure. Mild to moderate facet arthropathy with ligamentum flavum infolding. Small intraosseous synovial cyst on the left. No canal or foraminal stenosis. L5-S1: Anterolisthesis with uncovering of disc bulge. Mild facet arthropathy. No canal or foraminal stenosis. IMPRESSION: Trace anterolisthesis at L5-S1 likely secondary to chronic bilateral pars breaks. Facet predominant degenerative changes without high-grade stenosis. Heterogeneous signal underlying left flank skin marker is imaged only on the coronal series and presumably reflects postoperative changes. No well-defined fluid collection. Electronically Signed   By: Macy Mis M.D.   On: 07/18/2021 09:57    I, [name], personally (independently) visualized and performed the interpretation of the images attached in this note.    Assessment and Plan: 80 y.o. male with persistent left low back/left flank pain.  Pain has been ongoing for the last few months following his hernia surgery.  I saw him for this  for the first time June 30 and attempted to treat with core strengthening with physical therapy.  This seemed to make him a bit worse especially with attempt at stretching with physical  therapy.  He followed back up with his general surgeon who has been attempted to treat with gabapentin and baclofen with not much benefit.  At this point its not clear to me why exactly he is hurting.  Pretty likely that he either has a significant amount of muscle inflammation or potential entrapment or potentially entrapped nerve in the mesh.  Unfortunately the lumbar spine MRI was not very helpful to further evaluate cause of his pain.  After discussion with the patient and extensive discussion with helpful radiology plan to proceed with a modified radiology study.  Plan for a modified limited MRI of the abdomen with special focus on the posterior abdominal wall to further evaluate potential cause of pain.  I called the patient back after the MRI was ordered and explained the plan with the patient who expresses understanding and agreement.  Orders Placed This Encounter  Procedures   MR ABDOMEN LIMITED    Per conversation  With Dr  Ree Edman  Same protocol as MSK chest wall. Area of focus is left posterior abdominal wall and flank area of pain    Standing Status:   Future    Standing Expiration Date:   07/21/2022    Order Specific Question:   What is the patient's sedation requirement?    Answer:   No Sedation    Order Specific Question:   Does the patient have a pacemaker or implanted devices?    Answer:   No    Order Specific Question:   Preferred imaging location?    Answer:   [facility] (table limit-550lbs)    No orders of the defined types were placed in this encounter.    Discussed warning signs or symptoms. Please see discharge instructions. Patient expresses understanding.   The above documentation has been reviewed and is accurate and complete [name], M.D.  Total encounter time 40 minutes including face-to-face time with the patient and, reviewing past medical record, and charting on the date of service.   Extensive discussion with patient planning and  discussion with radiology time.

## 2021-07-21 NOTE — Patient Instructions (Signed)
Thank you for coming in today.   I will talk with Radiology about the right test to do next.   Next step if imaging is not helpful is a referral to [facility] / pain doctor for nerve block and ablation.

## 2021-07-26 ENCOUNTER — Telehealth: Payer: Self-pay | Admitting: Family Medicine

## 2021-07-26 NOTE — Telephone Encounter (Signed)
Pt has some worries about the abdominal MRI he would like to discuss with Dr. Georgina Snell before he schedules. I asked him to write down any questions he had to keep the conversation focused.   [phone]

## 2021-07-27 NOTE — Telephone Encounter (Signed)
Called and left a message with the patient explaining that the MRI ordered is not would limit the pain in his back where he is hurting.

## 2021-07-29 ENCOUNTER — Encounter: Payer: Self-pay | Admitting: Dermatology

## 2021-07-29 NOTE — Progress Notes (Signed)
   Follow-Up Visit   Subjective  [name] is a 80 y.o. male who presents for the following: Annual Exam (Left forehead x months- scaly ).  General skin examination, scaly spots on face. Location:  Duration:  Quality:  Associated Signs/Symptoms: Modifying Factors:  Severity:  Timing: Context:   Objective  Well appearing patient in no apparent distress; mood and affect are within normal limits. Mid Back Full body skin exam.  No atypical pigmented lesions or nonmelanoma skin cancer.  Left Buccal Cheek, Left Forehead, Left Zygomatic Area 2 to 4 mm crusts with pink gritty scale on  Left Anterior Mandible, Mid Chin, Right Buccal Cheek Pink scale, inflammationinvolving beard area.  More likely a variant of seborrheic dermatitis than contact.  Mid Back Stuck-on, waxy, tan-brown papules and plaques. --Discussed benign etiology and prognosis.     A full examination was performed including scalp, head, eyes, ears, nose, lips, neck, chest, axillae, abdomen, back, buttocks, bilateral upper extremities, bilateral lower extremities, hands, feet, fingers, toes, fingernails, and toenails. All findings within normal limits unless otherwise noted below.   Assessment & Plan    Screening for malignant neoplasm of skin Mid Back  Yearly skin exams.  Encouraged to self examine twice annually.  AK (actinic keratosis) (3) Left Zygomatic Area; Left Buccal Cheek; Left Forehead  Destruction of lesion - Left Buccal Cheek, Left Forehead, Left Zygomatic Area Complexity: simple   Destruction method: cryotherapy   Informed consent: discussed and consent obtained   Timeout:  patient name, date of birth, surgical site, and procedure verified Lesion destroyed using liquid nitrogen: Yes   Cryotherapy cycles:  3 Outcome: patient tolerated procedure well with no complications   Post-procedure details: wound care instructions given    Dermatitis Right Buccal Cheek; Left Anterior Mandible; Mid  Chin  Follow up in winter. Will use OTC hydrocortisone.  Hemangioma of skin Left Abdomen (side) - Upper  Benign no treatment needed.   Seborrheic keratosis Mid Back  Benign no treatment needed.       I, Lavonna Monarch, MD, have reviewed all documentation for this visit.  The documentation on 07/29/21 for the exam, diagnosis, procedures, and orders are all accurate and complete.

## 2021-08-07 ENCOUNTER — Other Ambulatory Visit: Payer: Self-pay

## 2021-08-07 ENCOUNTER — Ambulatory Visit
Admission: RE | Admit: 2021-08-07 | Discharge: 2021-08-07 | Disposition: A | Discharge status: Home / Self Care | Payer: Medicare Other | Source: Ambulatory Visit | Attending: Family Medicine | Admitting: Family Medicine

## 2021-08-07 DIAGNOSIS — K5730 Diverticulosis of large intestine without perforation or abscess without bleeding: Secondary | ICD-10-CM | POA: Diagnosis not present

## 2021-08-07 DIAGNOSIS — K439 Ventral hernia without obstruction or gangrene: Secondary | ICD-10-CM | POA: Diagnosis not present

## 2021-08-07 DIAGNOSIS — M4699 Unspecified inflammatory spondylopathy, multiple sites in spine: Secondary | ICD-10-CM | POA: Diagnosis not present

## 2021-08-07 DIAGNOSIS — R109 Unspecified abdominal pain: Secondary | ICD-10-CM

## 2021-08-07 DIAGNOSIS — M545 Low back pain, unspecified: Secondary | ICD-10-CM

## 2021-08-09 NOTE — Progress Notes (Signed)
MRI of the low back wall where the surgery was where you are having pain.  It shows that the hernia has been repaired and there are some postsurgical changes as expected.  No recurrent hernia is present.  There is a tiny fluid collection in the soft tissue kind of near where you hurt that could be a seroma.  This may or may not be the source of your pain.  I recommend that you return to clinic so we can go over the results in full detail and look at the pictures together and come up with a good plan.  I think we got some good pictures this time.

## 2021-08-09 NOTE — Progress Notes (Signed)
I, [name], LAT, ATC, am serving as scribe for [name].  [name] is a 80 y.o. male who presents to [place] at [facility] today for f/u of L-sided LBP/flank pain in the location of a prior L flank hernia repair.  He was last seen by [name] on 07/21/21 to review his L-spine MRI and was subsequently referred for an abdominal MRI that he had on 08/07/21.  He has had a course of PT at [facility].  Today, pt reports no change in his symptoms.  He notes that lumbar flexion is an aggravating activity as is L-spine rotation and lateral flexion.  He remains very frustrated that he has not been able to return to normal activity.  Even simple activities such as standing for longer than 15 or 20 minutes is painful.  An initial trial of physical therapy at [facility] rehab.  However the second visit he had mechanical stretching which significantly worsened his pain.  He has been fearful to return to PT since.  Diagnostic imaging: Abdominal MRI- 08/07/21; L-spine MRI- 07/17/21;   Pertinent review of systems: No fevers or chills  Relevant historical information: History recurrent left flank abdominal wall hernia with repair.   Exam:  BP 112/64 (BP Location: Right Arm, Patient Position: Sitting, Cuff Size: Normal)   Pulse (!) 53   Ht '5\' 4"'$  (1.626 m)   Wt 136 lb (61.7 kg)   SpO2 97%   BMI 23.34 kg/m  General: Well Developed, well nourished, and in no acute distress.   MSK: L-spine nontender midline.  Decreased lumbar motion pain with flexion and rotation and lateral flexion.  Posterior abdominal wall small area of palpable tenderness and swelling superior to the incisional scar. No palpable swelling or tenderness along the incisional scar.  Lower extremity strength is intact.    Lab and Radiology Results No results found for this or any previous visit (from the past 72 hour(s)). MR ABDOMEN WO CONTRAST  Result Date: 08/08/2021 CLINICAL DATA:  Left  anterior abdominal pain radiating to the back. EXAM: MRI ABDOMEN WITHOUT CONTRAST TECHNIQUE: Multiplanar multisequence MR imaging was performed without the administration of intravenous contrast. COMPARISON:  None. FINDINGS: Bones/Joint/Cartilage No fracture or dislocation. Normal alignment. No joint effusion. No marrow signal abnormality. Bilateral facet arthropathy at L4-5 and L5-S1. Muscles and Tendons Muscles are normal. Soft tissue No fluid collection or hematoma. No soft tissue mass. Left lateral and posterior abdominal wall hernia repair with postsurgical changes in the overlying subcutaneous fat. Tiny cystic area measuring 10 mm likely reflecting a seroma. No recurrent abdominal wall hernia. No focal abnormality of the liver, gallbladder, kidneys, pancreas or spleen. No bowel dilatation. Sigmoid diverticulosis without evidence of diverticulitis. No abdominal or pelvic free fluid. No bowel dilatation. IMPRESSION: 1. Left lateral and posterior abdominal wall hernia repair with postsurgical changes in the overlying subcutaneous fat. Tiny cystic area measuring 10 mm likely reflecting a seroma. No recurrent abdominal wall hernia. Electronically Signed   By: Kathreen Devoid M.D.   On: 08/08/2021 14:58    I, [name], personally (independently) visualized and performed the interpretation of the images attached in this note.  Diagnostic Limited MSK Ultrasound of: Posterior abdominal wall/lumbar spine Visible inspection ultrasound skin along incision reveals seroma identified on MRI measuring less than 1 cm.  This is nontender on examination.  No surrounding blood flow on Doppler.  Area of tenderness superior to incision on posterior abdominal wall/low back is noncystic in nature and  ultrasound.  Appears consistent with surrounding musculature thought to be muscle overlying posterior inferior rib.   Impression: Small nontender seroma.   Assessment and Plan: 80 y.o. male with persistently painful low back  following left abdominal wall/flank hernia repair/revision.  [name] has had extensive evaluation for this pain.  After some careful discussion with radiology I think we have a good MRI of the area of his pain.  He does have decreased muscle bulk of the abdominal wall around the area of his hernia repair which is to be expected.  He does have what seems to be a possible tiny seroma or cystic structure but it is not tender to palpation and I do not think is causing his pain.  His main source of pain is along the posterior musculature along left side of his abdominal wall and low back.  On MRI these structures look normal which is reassuring.  I think the main source of his pain is significant weakness and dysfunction of the muscles at the posterior lumbar spine and abdominal wall.  I think he will do well with physical therapy however I think her first attempted physical therapy failed because he did not tolerate the mechanical stretching at all.  I think second attempt at physical therapy should focus on strengthening with very gentle stretching.  If he cannot tolerate conventional physical therapy I think aquatic physical therapy would be a good alternative.  My backup backup plan is referral to [facility] to discuss possible ablation of the intercostal nerve providing sensation to this area or other potential interventions.   PDMP not reviewed this encounter. Orders Placed This Encounter  Procedures   [place] LIMITED JOINT SPACE STRUCTURES LOW LEFT(NO LINKED CHARGES)    Order Specific Question:   Reason for Exam (SYMPTOM  OR DIAGNOSIS REQUIRED)    Answer:   L flank pain    Order Specific Question:   Preferred imaging location?    Answer:   [place]   Ambulatory referral to Physical Therapy    Referral Priority:   Routine    Referral Type:   Physical Medicine    Referral Reason:   Specialty Services Required    Requested Specialty:   Physical Therapy    Number of Visits  Requested:   1   No orders of the defined types were placed in this encounter.    Discussed warning signs or symptoms. Please see discharge instructions. Patient expresses understanding.   The above documentation has been reviewed and is accurate and complete [name], M.D.  Total encounter time 40 minutes including face-to-face time with the patient and, reviewing past medical record, and charting on the date of service.   Lengthy discussion regarding MRI findings treatment plan and options.

## 2021-08-10 ENCOUNTER — Other Ambulatory Visit: Payer: Self-pay

## 2021-08-10 ENCOUNTER — Ambulatory Visit: Payer: Self-pay

## 2021-08-10 ENCOUNTER — Ambulatory Visit (INDEPENDENT_AMBULATORY_CARE_PROVIDER_SITE_OTHER): Payer: Medicare Other | Admitting: Family Medicine

## 2021-08-10 ENCOUNTER — Encounter: Payer: Self-pay | Admitting: Family Medicine

## 2021-08-10 VITALS — BP 112/64 | HR 53 | Ht 64.0 in | Wt 136.0 lb

## 2021-08-10 DIAGNOSIS — R109 Unspecified abdominal pain: Secondary | ICD-10-CM

## 2021-08-10 DIAGNOSIS — I2510 Atherosclerotic heart disease of native coronary artery without angina pectoris: Secondary | ICD-10-CM

## 2021-08-10 DIAGNOSIS — M545 Low back pain, unspecified: Secondary | ICD-10-CM | POA: Diagnosis not present

## 2021-08-10 NOTE — Patient Instructions (Signed)
Thank you for coming in today.   I've referred you to Physical Therapy.  Let us know if you don't hear from them in one week.   Recheck in 6 weeks.   Let me know if you have a problem.

## 2021-08-25 ENCOUNTER — Encounter: Payer: Self-pay | Admitting: Physical Therapy

## 2021-08-25 ENCOUNTER — Other Ambulatory Visit: Payer: Self-pay

## 2021-08-25 ENCOUNTER — Ambulatory Visit (INDEPENDENT_AMBULATORY_CARE_PROVIDER_SITE_OTHER): Payer: Medicare Other | Admitting: Physical Therapy

## 2021-08-25 DIAGNOSIS — M545 Low back pain, unspecified: Secondary | ICD-10-CM

## 2021-08-25 NOTE — Patient Instructions (Signed)
Access Code: [email or web address] URL: [email or web address] Date: 08/25/2021 Prepared by: Lyndee Hensen  Exercises Single Knee to Chest Stretch - 2 x daily - 3 reps - 30 hold Supine Posterior Pelvic Tilt - 2 x daily - 1-2 sets - 10 reps Supine Lower Trunk Rotation - 2 x daily - 10 reps - 5 hold

## 2021-08-28 ENCOUNTER — Encounter: Payer: Self-pay | Admitting: Physical Therapy

## 2021-08-28 NOTE — Therapy (Signed)
[facility] [address] [place], [place], [address] Phone: [phone]   Fax:  [phone]  Physical Therapy Evaluation  Patient Details  Name: [name] MRN: [id number] Date of Birth: 02-18-1941 Referring Provider (PT): [name]   Encounter Date: 08/25/2021   PT End of Session - 08/28/21 1017     Visit Number 1    Number of Visits 12    Date for PT Re-Evaluation 10/06/21    Authorization Type Medicare    PT Start Time 1215    PT Stop Time [id number]    PT Time Calculation (min) 53 min    Activity Tolerance Patient tolerated treatment well;Patient limited by pain    Behavior During Therapy [facility] for tasks assessed/performed             Past Medical History:  Diagnosis Date   Arthritis    Atrial fibrillation ([place])    post MI-amiodarone stopped 07/2008   Benign prostatic hypertrophy    CAD (coronary artery disease) 07/2008   2 BMS placed for MI, August, 2009   Carotid artery disease without cerebral infarction [facility])    CORONARY ARTERY BYPASS GRAFT, HX OF 07/13/2009   August, 2009, Dr. Cyndia Bent   Ejection fraction    .   History of kidney stones    History of nephrolithiasis    Hx of CABG    Hx of colonoscopy    Hyperlipidemia    HYPERLIPIDEMIA 02/09/2009   Qualifier: Diagnosis of  By: Jenny Reichmann MD, Hunt Oris    Increased prostate specific antigen (PSA) velocity    Myocardial infarction ([place])    NEPHROLITHIASIS, HX OF 02/09/2009   Qualifier: Diagnosis of  By: Jenny Reichmann MD, Hunt Oris    Prostatitis    PSA elevation 09/06/2012   Thrombocytopenia [facility])     Past Surgical History:  Procedure Laterality Date   CORONARY ARTERY BYPASS GRAFT  08/05/08   x5, LIMA to the LAD.    CORONARY STENT PLACEMENT  07/2008   2 BMS to RCA   [place]  04/16/2006   Left flank incisional hernia repair with mesh.  Dr Lonzo Cloud HERNIA REPAIR N/A 02/11/2021   Procedure: LAPAROSCOPIC REPAIR OF   INCISIONAL RECURRENT LEFT FLANK HERNIA WITH MESH TAP BLOCK - BILATERAL;  Surgeon: Michael Boston, MD;  Location: [facility];  Service: General;  Laterality: N/A;   left leg broken     PROSTATE BIOPSY  2007   neg   ROTATOR CUFF REPAIR     bilateral; Dr. Noemi Chapel    There were no vitals filed for this visit.    Subjective Assessment - 08/28/21 1002     Subjective Pt with L sided posterior abdominal/back pain since hernia surgery in March 2022. Pt reports severe pain that comes on with lumbar flexion. He has had signficant decease in functional and community activities due to pain, and has been unable to play golf. Due to pain, he is avoiding this position, and is unable to bend forward for sitting for meals, dressing, putting on shoes, and most functional activity and IADLs. He sits sideways in recliner for meals. Pt is able to walk, about 2.5 mi/day and does not have any pain with walking. He is fearful of movement into flexion, due to onset of pain. If pain is present, he can stand for relief, but usually needs to lay flat, he says for about 1  hr, then pain will subside. He has most pain at mid scar site for recent hernia surgery. He has had recent imaging that is negative. Pain has not improved since March. Pt very concerned for pain, and very frustrated wtih minimal improvment up to this point.    Pertinent History Hernia surgery in 2010, and 2022.    Limitations Lifting;House hold activities;Sitting    Patient Stated Goals decreased pain, improved ability for lumbar flexion, sitting, and bending.    Currently in Pain? Yes    Pain Score 8     Pain Location Back    Pain Orientation Left    Pain Descriptors / Indicators Sore    Pain Type Acute pain;Chronic pain    Pain Onset More than a month ago    Pain Frequency Intermittent    Aggravating Factors  lumbar flexion, sitting    Pain Relieving Factors standing up, laying flat                [facility] PT Assessment - 08/28/21 0001        Assessment   Medical Diagnosis Flank pain, back pain    Referring Provider (PT) [name]    Prior Therapy no      Precautions   Precaution Comments previous hernia repair      Balance Screen   Has the patient fallen in the past 6 months No      Prior Function   Level of Independence Independent      Cognition   Overall Cognitive Status Within Functional Limits for tasks assessed      Observation/Other Assessments   Observations L abdomen: Large incision from 1st hernia surgery (10 yrs ago); smaller incisions from recent hernia surgery (2022)      AROM   Overall AROM Comments Lumbar flexion: severe limitation, (flexible posture) but limited due to pain and anticipation of pain;  Hips: WNL      Strength   Overall Strength Comments Hips: 4/5, knee: 5/5      Palpation   Palpation comment Tenderness at mid scar, and at lateral/lower rib just superior to scar      Special Tests   Other special tests pain reproduced with lumbar flexion of 30-45 deg; Decreased with standing up straight, or laying flat for a few minutes. No other aggravating factors;                        Objective measurements completed on examination: See above findings.       [facility] Adult PT Treatment/Exercise - 08/28/21 0001       Exercises   Exercises Lumbar      Lumbar Exercises: Stretches   Single Knee to Chest Stretch 3 reps;30 seconds;Right;Left    Lower Trunk Rotation 5 reps;10 seconds    Pelvic Tilt 15 reps      Lumbar Exercises: Standing   Other Standing Lumbar Exercises Standing lumbar flexion (30-45 deg) x 4, causes increased pain.                     PT Education - 08/28/21 1017     Education Details PT POC, Exam findings, HEP    Person(s) Educated Patient    Methods Explanation;Demonstration;Verbal cues;Tactile cues;Handout    Comprehension Verbalized understanding;Returned demonstration;Verbal cues required;Tactile cues required;Need further instruction               PT Short Term Goals - 08/28/21 1020  PT SHORT TERM GOAL #1   Title Pt to be independent with initial HEP    Time 2    Period Weeks    Status New    Target Date 09/08/21               PT Long Term Goals - 08/28/21 1021       PT LONG TERM GOAL #1   Title Pt to be independent with final HEP    Time 6    Period Weeks    Status New    Target Date 10/06/21      PT LONG TERM GOAL #2   Title Pt to report decreased pain in back to 0-2/10 with lumbar flexion.    Time 6    Period Weeks    Status New    Target Date 10/06/21      PT LONG TERM GOAL #3   Title Pt to demo ability for lumbar flexion ROM , to at least 60 deg, to improve ability for bending, dressing, and IADLS.    Time 6    Period Weeks    Status New    Target Date 10/06/21      PT LONG TERM GOAL #4   Title Pt to report ability to sit upright in chair, for meals and relaxing/rest, for at least 15-30 min. without pain more than 2/10    Time 6    Period Weeks    Status New    Target Date 10/06/21                    Plan - 08/28/21 1025     Clinical Impression Statement Pt presents with primary complaint of increased pain in lateral trunk and low back, at site of scar that started after hernia repair in March. Pt with pain only with lumbar flexion, causing severe pain. Due to pain, pt has been avoiding flexion, reaching, bending for dressing, or sitting at table for meals. Pt has severe limitation in ability for regular ADLS and IADLS, due to pain. Pt is able to stand and walk without pain. Pt does not appear to have significant scar tissue superficially at the site, but it is tender with palpation.  Pt to benefit from trial of skilled PT to improve pain and deficit.  Discussed trying stretching and mobility ther ex for improving fascial restrictions , to improve pain with ROM. If pts pain does not improve, he will be referred back to MD for further assessment. He has had negtive  imaging up to this point, but location and nature of this pain is concerning for dysfunction of the tisue at the surgical site, and his ongoing pain is having a signficant impact on his ability for functional activity.    Personal Factors and Comorbidities Comorbidity 1    Comorbidities hernia repair march 2022    Examination-Activity Limitations Bathing;Lift;Bed Mobility;Toileting;Bend;Transfers;Self Feeding;Carry;Sit;Squat;Dressing;Hygiene/Grooming    Examination-Participation Restrictions Cleaning;Meal Prep;Yard Work;Community Activity;Driving;Shop    Stability/Clinical Decision Making Evolving/Moderate complexity    Clinical Decision Making Moderate    Rehab Potential Fair    PT Frequency 2x / week    PT Duration 6 weeks    PT Treatment/Interventions ADLs/Self Care Home Management;Cryotherapy;Moist Heat;Traction;DME Instruction;Balance training;Therapeutic exercise;Therapeutic activities;Functional mobility training;Stair training;Gait training;Neuromuscular re-education;Patient/family education;Manual techniques;Passive range of motion;Dry needling;Spinal Manipulations;Joint Manipulations;Scar mobilization    Consulted and Agree with Plan of Care Patient             Patient will benefit from skilled  therapeutic intervention in order to improve the following deficits and impairments:  Pain, Decreased mobility, Increased muscle spasms, Decreased range of motion, Decreased activity tolerance, Impaired flexibility  Visit Diagnosis: Acute left-sided low back pain without sciatica     Problem List Patient Active Problem List   Diagnosis Date Noted   Chronic left flank pain 06/29/2021   Constipation, chronic 02/18/2021   Acute urinary retention 02/18/2021   Diverticular disease of colon 02/11/2021   Personal history of colonic polyps 02/11/2021   Colon cancer screening 02/11/2021   Recurrent left flank incisional hernia s/p lap repair w mesh 02/11/2021 02/11/2021   Postcalcaneal  bursitis of left foot 04/09/2020   Disorder of left eustachian tube 08/14/2019   Vertigo 12/19/2018   Partial degenerative rupture of right biceps tendon 07/18/2017   Right lateral epicondylitis 07/18/2017   Hyperglycemia 04/18/2017   High foot arch 03/29/2016   Arthritis, midfoot 03/29/2016   Tibialis posterior tendinitis 03/29/2016   Left foot pain 03/28/2016   Mitral regurgitation 09/05/2013   Carotid artery disease without cerebral infarction [facility])    Hx of CABG    Ejection fraction    Preventative health care 08/30/2012   Atrial fibrillation ([place])    Thrombocytopenia ([place]) 07/26/2009   Headache(784.0) 07/15/2009   Hyperlipidemia 02/09/2009   Anxiety state 02/09/2009   Depression 02/09/2009   BENIGN PROSTATIC HYPERTROPHY 02/09/2009   PSA, INCREASED 02/09/2009   NEPHROLITHIASIS, HX OF 02/09/2009   CAD (coronary artery disease) 07/11/2008    Lyndee Hensen, PT, DPT 10:44 AM  08/28/21   [facility] [address] [place], [place], [address] Phone: [phone]   Fax:  [phone]  Name: [name] MRN: [id number] Date of Birth: Apr 16, 1941

## 2021-08-30 ENCOUNTER — Encounter: Payer: Self-pay | Admitting: Physical Therapy

## 2021-08-30 ENCOUNTER — Other Ambulatory Visit: Payer: Self-pay

## 2021-08-30 ENCOUNTER — Ambulatory Visit (INDEPENDENT_AMBULATORY_CARE_PROVIDER_SITE_OTHER): Payer: Medicare Other | Admitting: Physical Therapy

## 2021-08-30 DIAGNOSIS — M545 Low back pain, unspecified: Secondary | ICD-10-CM | POA: Diagnosis not present

## 2021-08-30 NOTE — Therapy (Signed)
[facility] [address] [place], [place], [address] Phone: [phone]   Fax:  [phone]  Physical Therapy Treatment  Patient Details  Name: [name] MRN: [id number] Date of Birth: 04/09/1941 Referring Provider (PT): [name]   Encounter Date: 08/30/2021   PT End of Session - 08/30/21 1202     Visit Number 2    Number of Visits 12    Date for PT Re-Evaluation 10/06/21    Authorization Type Medicare    PT Start Time 5456    PT Stop Time 2563    PT Time Calculation (min) 43 min    Activity Tolerance Patient tolerated treatment well;Patient limited by pain    Behavior During Therapy [facility] for tasks assessed/performed             Past Medical History:  Diagnosis Date   Arthritis    Atrial fibrillation ([place])    post MI-amiodarone stopped 07/2008   Benign prostatic hypertrophy    CAD (coronary artery disease) 07/2008   2 BMS placed for MI, August, 2009   Carotid artery disease without cerebral infarction [facility])    CORONARY ARTERY BYPASS GRAFT, HX OF 07/13/2009   August, 2009, Dr. Cyndia Bent   Ejection fraction    .   History of kidney stones    History of nephrolithiasis    Hx of CABG    Hx of colonoscopy    Hyperlipidemia    HYPERLIPIDEMIA 02/09/2009   Qualifier: Diagnosis of  By: Jenny Reichmann MD, Hunt Oris    Increased prostate specific antigen (PSA) velocity    Myocardial infarction ([place])    NEPHROLITHIASIS, HX OF 02/09/2009   Qualifier: Diagnosis of  By: Jenny Reichmann MD, Hunt Oris    Prostatitis    PSA elevation 09/06/2012   Thrombocytopenia [facility])     Past Surgical History:  Procedure Laterality Date   CORONARY ARTERY BYPASS GRAFT  08/05/08   x5, LIMA to the LAD.    CORONARY STENT PLACEMENT  07/2008   2 BMS to RCA   [place]  04/16/2006   Left flank incisional hernia repair with mesh.  Dr Lonzo Cloud HERNIA REPAIR N/A 02/11/2021   Procedure: LAPAROSCOPIC REPAIR OF   INCISIONAL RECURRENT LEFT FLANK HERNIA WITH MESH TAP BLOCK - BILATERAL;  Surgeon: Michael Boston, MD;  Location: [facility];  Service: General;  Laterality: N/A;   left leg broken     PROSTATE BIOPSY  2007   neg   ROTATOR CUFF REPAIR     bilateral; Dr. Noemi Chapel    There were no vitals filed for this visit.   Subjective Assessment - 08/30/21 1110     Subjective Pt states soreness in anterior/lateral trunk/hernia. States he also has pain here with any straining, going to bathroom, etc.    Currently in Pain? Yes    Pain Score 4     Pain Location Abdomen    Pain Orientation Left    Pain Descriptors / Indicators Aching    Pain Type Chronic pain    Pain Onset More than a month ago    Pain Frequency Intermittent                               [facility] Adult PT Treatment/Exercise - 08/30/21 0001       Exercises   Exercises Lumbar  Lumbar Exercises: Stretches   Single Knee to Chest Stretch 3 reps;30 seconds;Right;Left    Lower Trunk Rotation 5 reps;10 seconds    Pelvic Tilt 15 reps    Standing Side Bend 3 reps;20 seconds;Left      Lumbar Exercises: Standing   Other Standing Lumbar Exercises Standing lumbar flexion (30-45 deg) x 4, causes increased pain.      Lumbar Exercises: Supine   Ab Set 15 reps;3 seconds      Lumbar Exercises: Quadruped   [place] 15 reps      Manual Therapy   Manual Therapy Soft tissue mobilization    Soft tissue mobilization STM , TPR to L lumbar, lateral at incision, and at lower rib attachment.                       PT Short Term Goals - 08/28/21 1020       PT SHORT TERM GOAL #1   Title Pt to be independent with initial HEP    Time 2    Period Weeks    Status New    Target Date 09/08/21               PT Long Term Goals - 08/28/21 1021       PT LONG TERM GOAL #1   Title Pt to be independent with final HEP    Time 6    Period Weeks    Status New    Target Date 10/06/21      PT LONG TERM GOAL  #2   Title Pt to report decreased pain in back to 0-2/10 with lumbar flexion.    Time 6    Period Weeks    Status New    Target Date 10/06/21      PT LONG TERM GOAL #3   Title Pt to demo ability for lumbar flexion ROM , to at least 60 deg, to improve ability for bending, dressing, and IADLS.    Time 6    Period Weeks    Status New    Target Date 10/06/21      PT LONG TERM GOAL #4   Title Pt to report ability to sit upright in chair, for meals and relaxing/rest, for at least 15-30 min. without pain more than 2/10    Time 6    Period Weeks    Status New    Target Date 10/06/21                   Plan - 08/30/21 1203     Clinical Impression Statement Pt with no significant pain with STM to scar region today, tissue mobilization done to improve ability for lumbar flexion ROM. Pt able to perform ther ex today, but does have mild discomfort with stretching and TA contraction ( in back). He also has more discomfort in area of hernia laterally today. Discussed using light abdominal brace for improved pain, pt reporting diffiuclty with going to bathroom, has to use manual pressure at side to avoid pain in abdomen. Plan to progress light trunk bending, with hip hinge motion next visit as tolerated.    Personal Factors and Comorbidities Comorbidity 1    Comorbidities hernia repair march 2022    Examination-Activity Limitations Bathing;Lift;Bed Mobility;Toileting;Bend;Transfers;Self Feeding;Carry;Sit;Squat;Dressing;Hygiene/Grooming    Examination-Participation Restrictions Cleaning;Meal Prep;Yard Work;Community Activity;Driving;Shop    Stability/Clinical Decision Making Evolving/Moderate complexity    Rehab Potential Fair    PT Frequency 2x / week  PT Duration 6 weeks    PT Treatment/Interventions ADLs/Self Care Home Management;Cryotherapy;Moist Heat;Traction;DME Instruction;Balance training;Therapeutic exercise;Therapeutic activities;Functional mobility training;Stair training;Gait  training;Neuromuscular re-education;Patient/family education;Manual techniques;Passive range of motion;Dry needling;Spinal Manipulations;Joint Manipulations;Scar mobilization    Consulted and Agree with Plan of Care Patient             Patient will benefit from skilled therapeutic intervention in order to improve the following deficits and impairments:  Pain, Decreased mobility, Increased muscle spasms, Decreased range of motion, Decreased activity tolerance, Impaired flexibility  Visit Diagnosis: Acute left-sided low back pain without sciatica     Problem List Patient Active Problem List   Diagnosis Date Noted   Chronic left flank pain 06/29/2021   Constipation, chronic 02/18/2021   Acute urinary retention 02/18/2021   Diverticular disease of colon 02/11/2021   Personal history of colonic polyps 02/11/2021   Colon cancer screening 02/11/2021   Recurrent left flank incisional hernia s/p lap repair w mesh 02/11/2021 02/11/2021   Postcalcaneal bursitis of left foot 04/09/2020   Disorder of left eustachian tube 08/14/2019   Vertigo 12/19/2018   Partial degenerative rupture of right biceps tendon 07/18/2017   Right lateral epicondylitis 07/18/2017   Hyperglycemia 04/18/2017   High foot arch 03/29/2016   Arthritis, midfoot 03/29/2016   Tibialis posterior tendinitis 03/29/2016   Left foot pain 03/28/2016   Mitral regurgitation 09/05/2013   Carotid artery disease without cerebral infarction [facility])    Hx of CABG    Ejection fraction    Preventative health care 08/30/2012   Atrial fibrillation ([place])    Thrombocytopenia ([place]) 07/26/2009   Headache(784.0) 07/15/2009   Hyperlipidemia 02/09/2009   Anxiety state 02/09/2009   Depression 02/09/2009   BENIGN PROSTATIC HYPERTROPHY 02/09/2009   PSA, INCREASED 02/09/2009   NEPHROLITHIASIS, HX OF 02/09/2009   CAD (coronary artery disease) 07/11/2008    Lyndee Hensen, PT, DPT 12:06 PM  08/30/21    [facility] [address] [place], [place], [address] Phone: [phone]   Fax:  [phone]  Name: [name] MRN: [id number] Date of Birth: 03/17/1941

## 2021-09-03 ENCOUNTER — Emergency Department (HOSPITAL_COMMUNITY)
Admission: EM | Admit: 2021-09-03 | Discharge: 2021-09-04 | Disposition: A | Discharge status: Home / Self Care | Payer: Medicare Other | Attending: Emergency Medicine | Admitting: Emergency Medicine

## 2021-09-03 ENCOUNTER — Other Ambulatory Visit: Payer: Self-pay

## 2021-09-03 DIAGNOSIS — L03114 Cellulitis of left upper limb: Secondary | ICD-10-CM

## 2021-09-03 DIAGNOSIS — Z951 Presence of aortocoronary bypass graft: Secondary | ICD-10-CM | POA: Insufficient documentation

## 2021-09-03 DIAGNOSIS — Z7982 Long term (current) use of aspirin: Secondary | ICD-10-CM | POA: Diagnosis not present

## 2021-09-03 DIAGNOSIS — R2232 Localized swelling, mass and lump, left upper limb: Secondary | ICD-10-CM | POA: Insufficient documentation

## 2021-09-03 DIAGNOSIS — Z955 Presence of coronary angioplasty implant and graft: Secondary | ICD-10-CM | POA: Insufficient documentation

## 2021-09-03 DIAGNOSIS — I2510 Atherosclerotic heart disease of native coronary artery without angina pectoris: Secondary | ICD-10-CM | POA: Diagnosis not present

## 2021-09-04 DIAGNOSIS — R2232 Localized swelling, mass and lump, left upper limb: Secondary | ICD-10-CM | POA: Diagnosis not present

## 2021-09-04 MED ORDER — CEFTRIAXONE SODIUM 1 G IJ SOLR
1.0000 g | Freq: Once | INTRAMUSCULAR | Status: AC
  Administered 2021-09-04: 1 g via INTRAMUSCULAR
  Filled 2021-09-04: qty 10

## 2021-09-04 MED ORDER — SULFAMETHOXAZOLE-TRIMETHOPRIM 800-160 MG PO TABS
1.0000 | ORAL_TABLET | Freq: Once | ORAL | Status: AC
  Administered 2021-09-04: 1 via ORAL
  Filled 2021-09-04: qty 1

## 2021-09-04 MED ORDER — SULFAMETHOXAZOLE-TRIMETHOPRIM 800-160 MG PO TABS: 1.0000 | ORAL_TABLET | Freq: Two times a day (BID) | ORAL | 0 refills | Status: AC

## 2021-09-04 MED ORDER — STERILE WATER FOR INJECTION IJ SOLN
INTRAMUSCULAR | Status: AC
  Administered 2021-09-04: 2.1 mL
  Filled 2021-09-04: qty 10

## 2021-09-04 MED ORDER — CEPHALEXIN 500 MG PO CAPS
500.0000 mg | ORAL_CAPSULE | Freq: Four times a day (QID) | ORAL | 0 refills | Status: DC
Start: 2021-09-04 — End: 2021-10-12

## 2021-09-04 NOTE — Discharge Instructions (Addendum)
Begin taking Keflex and Bactrim as prescribed.  Apply warm compresses as frequently as possible for the next 2 days.  Return to the emergency department for worsening redness, streaking extending above the shoulder, high fevers, or other new and concerning symptoms.

## 2021-09-04 NOTE — ED Provider Notes (Signed)
[facility] DEPT Provider Note   CSN: [id number] Arrival date & time: 09/03/21  2345     History Chief Complaint  Patient presents with   Insect Bite    [name] is a 80 y.o. male.  Patient is a 81 year old male with past medical history of coronary artery disease with CABG, atrial fibrillation, hyperlipidemia.  Patient presenting today for evaluation of left arm pain and redness.  This began 2 days ago and is worsening.  He first noticed a small bump to the area of his left antecubital fossa.  He scratched this area, then it became more swollen and red and is now noticing streaks extending into the bicep.  He denies any fevers or chills.  He denies any numbness or tingling.  The history is provided by the patient.      Past Medical History:  Diagnosis Date   Arthritis    Atrial fibrillation ([place])    post MI-amiodarone stopped 07/2008   Benign prostatic hypertrophy    CAD (coronary artery disease) 07/2008   2 BMS placed for MI, August, 2009   Carotid artery disease without cerebral infarction [facility])    CORONARY ARTERY BYPASS GRAFT, HX OF 07/13/2009   August, 2009, Dr. Cyndia Bent   Ejection fraction    .   History of kidney stones    History of nephrolithiasis    Hx of CABG    Hx of colonoscopy    Hyperlipidemia    HYPERLIPIDEMIA 02/09/2009   Qualifier: Diagnosis of  By: Jenny Reichmann MD, Hunt Oris    Increased prostate specific antigen (PSA) velocity    Myocardial infarction ([place])    NEPHROLITHIASIS, HX OF 02/09/2009   Qualifier: Diagnosis of  By: Jenny Reichmann MD, Hunt Oris    Prostatitis    PSA elevation 09/06/2012   Thrombocytopenia [facility])     Patient Active Problem List   Diagnosis Date Noted   Chronic left flank pain 06/29/2021   Constipation, chronic 02/18/2021   Acute urinary retention 02/18/2021   Diverticular disease of colon 02/11/2021   Personal history of colonic polyps 02/11/2021   Colon cancer screening 02/11/2021   Recurrent left flank  incisional hernia s/p lap repair w mesh 02/11/2021 02/11/2021   Postcalcaneal bursitis of left foot 04/09/2020   Disorder of left eustachian tube 08/14/2019   Vertigo 12/19/2018   Partial degenerative rupture of right biceps tendon 07/18/2017   Right lateral epicondylitis 07/18/2017   Hyperglycemia 04/18/2017   High foot arch 03/29/2016   Arthritis, midfoot 03/29/2016   Tibialis posterior tendinitis 03/29/2016   Left foot pain 03/28/2016   Mitral regurgitation 09/05/2013   Carotid artery disease without cerebral infarction [facility])    Hx of CABG    Ejection fraction    Preventative health care 08/30/2012   Atrial fibrillation ([place])    Thrombocytopenia ([place]) 07/26/2009   Headache(784.0) 07/15/2009   Hyperlipidemia 02/09/2009   Anxiety state 02/09/2009   Depression 02/09/2009   BENIGN PROSTATIC HYPERTROPHY 02/09/2009   PSA, INCREASED 02/09/2009   NEPHROLITHIASIS, HX OF 02/09/2009   CAD (coronary artery disease) 07/11/2008    Past Surgical History:  Procedure Laterality Date   CORONARY ARTERY BYPASS GRAFT  08/05/08   x5, LIMA to the LAD.    CORONARY STENT PLACEMENT  07/2008   2 BMS to RCA   [place]  04/16/2006   Left flank incisional hernia repair with mesh.  Dr Harlow Asa  INGUINAL HERNIA REPAIR N/A 02/11/2021   Procedure: LAPAROSCOPIC REPAIR OF  INCISIONAL RECURRENT LEFT FLANK HERNIA WITH MESH TAP BLOCK - BILATERAL;  Surgeon: Michael Boston, MD;  Location: [facility];  Service: General;  Laterality: N/A;   left leg broken     PROSTATE BIOPSY  2007   neg   ROTATOR CUFF REPAIR     bilateral; Dr. Noemi Chapel       Family History  Problem Relation Age of Onset   Lung cancer Mother    Leukemia Father    Healthy Brother    Colon cancer Neg Hx    Rectal cancer Neg Hx    Stomach cancer Neg Hx     Social History   Tobacco Use   Smoking status: Never   Smokeless tobacco: Never  Vaping Use   Vaping Use: Never used   Substance Use Topics   Alcohol use: No   Drug use: No    Home Medications Prior to Admission medications   Medication Sig Start Date End Date Taking? Authorizing Provider  acetaminophen (TYLENOL) 500 MG tablet Take 1,000 mg by mouth every 6 (six) hours as needed for mild pain.    [provider]  aspirin 81 MG EC tablet Take 1 tablet (81 mg total) by mouth daily. Swallow whole. 09/06/12   Biagio Borg, MD  Cholecalciferol (VITAMIN D) 50 MCG (2000 UT) CAPS Take 2,000 Units by mouth daily.    [provider]  Multiple Vitamin (MULTIVITAMIN) tablet Take 1 tablet by mouth daily.    [provider]  simvastatin (ZOCOR) 40 MG tablet TAKE 1/2 (ONE-HALF) TABLET BY MOUTH AT BEDTIME 06/29/21   Biagio Borg, MD    Allergies    Niacin  Review of Systems   Review of Systems  All other systems reviewed and are negative.  Physical Exam Updated Vital Signs BP 120/77 (BP Location: Right Arm)   Pulse 60   Temp 98 F (36.7 C) (Oral)   Resp 18   Ht 5\' 4"  (1.626 m)   Wt 61.7 kg   SpO2 100%   BMI 23.34 kg/m   Physical Exam Vitals and nursing note reviewed.  Constitutional:      General: He is not in acute distress.    Appearance: Normal appearance. He is not ill-appearing.  HENT:     Head: Normocephalic and atraumatic.  Pulmonary:     Effort: Pulmonary effort is normal.  Skin:    General: Skin is warm and dry.     Comments: There is a 2 cm, round, erythematous area just above the left antecubital fossa.  There are lymphangitic streaks extending into the bicep.  See photo below.  The arm is otherwise neurovascularly intact with easily palpable ulnar and radial pulses and motor and sensation intact throughout the entire hand.  Neurological:     Mental Status: He is alert and oriented to person, place, and time.      ED Results / Procedures / Treatments   Labs (all labs ordered are listed, but only abnormal results are displayed) Labs Reviewed - No data to  display  EKG None  Radiology No results found.  Procedures Procedures   Medications Ordered in ED Medications  cefTRIAXone (ROCEPHIN) injection 1 g (has no administration in time range)  sulfamethoxazole-trimethoprim (BACTRIM DS) 800-160 MG per tablet 1 tablet (has no administration in time range)    ED Course  I have reviewed the triage vital signs and the nursing notes.  Pertinent labs &  imaging results that were available during my care of the patient were reviewed by me and considered in my medical decision making (see chart for details).    MDM Rules/Calculators/A&P  Patient with a possibly infected insect bite with lymphangitic streaking up into the arm.  Patient will be treated with Keflex and Bactrim.  IM Rocephin given this evening prior to discharge.  He is to return as needed if symptoms worsen.  Final Clinical Impression(s) / ED Diagnoses Final diagnoses:  None    Rx / DC Orders ED Discharge Orders     None        Veryl Speak, MD 09/04/21 [phone]

## 2021-09-04 NOTE — ED Triage Notes (Signed)
Pt has insect bite to L anterior upper arm with phlebitis

## 2021-09-05 ENCOUNTER — Telehealth: Payer: Self-pay | Admitting: Internal Medicine

## 2021-09-05 NOTE — Telephone Encounter (Signed)
[facility] FYI...   Caller states he has had a an itchy bump thinking it was a bug bite that happened about 2-3 days ago, scratched it this morning or last night that caused an abrasion and just noticed red line that is running up his arm from the center of the elbow up toward the shoulder about 2-3 hours ago.  Advised to see PCP within 4 hrs. Patient understood and decided to go to [facility] ED

## 2021-09-05 NOTE — Telephone Encounter (Signed)
See below

## 2021-09-06 ENCOUNTER — Other Ambulatory Visit: Payer: Self-pay

## 2021-09-06 ENCOUNTER — Ambulatory Visit (INDEPENDENT_AMBULATORY_CARE_PROVIDER_SITE_OTHER): Payer: Medicare Other | Admitting: Physical Therapy

## 2021-09-06 DIAGNOSIS — M545 Low back pain, unspecified: Secondary | ICD-10-CM

## 2021-09-08 ENCOUNTER — Telehealth: Payer: Medicare Other | Admitting: Internal Medicine

## 2021-09-08 ENCOUNTER — Encounter: Payer: Self-pay | Admitting: Physical Therapy

## 2021-09-08 NOTE — Therapy (Signed)
[facility] [address] [place], [place], [address] Phone: [phone]   Fax:  [phone]  Physical Therapy Treatment  Patient Details  Name: [name] MRN: [id number] Date of Birth: 1941/09/03 Referring Provider (PT): [name]   Encounter Date: 09/06/2021   PT End of Session - 09/08/21 0818     Visit Number 3    Number of Visits 12    Date for PT Re-Evaluation 10/06/21    Authorization Type Medicare    PT Start Time 0930    PT Stop Time 7106    PT Time Calculation (min) 45 min    Activity Tolerance Patient tolerated treatment well;Patient limited by pain    Behavior During Therapy [facility] for tasks assessed/performed             Past Medical History:  Diagnosis Date   Arthritis    Atrial fibrillation ([place])    post MI-amiodarone stopped 07/2008   Benign prostatic hypertrophy    CAD (coronary artery disease) 07/2008   2 BMS placed for MI, August, 2009   Carotid artery disease without cerebral infarction [facility])    CORONARY ARTERY BYPASS GRAFT, HX OF 07/13/2009   August, 2009, Dr. Cyndia Bent   Ejection fraction    .   History of kidney stones    History of nephrolithiasis    Hx of CABG    Hx of colonoscopy    Hyperlipidemia    HYPERLIPIDEMIA 02/09/2009   Qualifier: Diagnosis of  By: Jenny Reichmann MD, Hunt Oris    Increased prostate specific antigen (PSA) velocity    Myocardial infarction ([place])    NEPHROLITHIASIS, HX OF 02/09/2009   Qualifier: Diagnosis of  By: Jenny Reichmann MD, Hunt Oris    Prostatitis    PSA elevation 09/06/2012   Thrombocytopenia [facility])     Past Surgical History:  Procedure Laterality Date   CORONARY ARTERY BYPASS GRAFT  08/05/08   x5, LIMA to the LAD.    CORONARY STENT PLACEMENT  07/2008   2 BMS to RCA   [place]  04/16/2006   Left flank incisional hernia repair with mesh.  Dr Lonzo Cloud HERNIA REPAIR N/A 02/11/2021   Procedure: LAPAROSCOPIC REPAIR OF   INCISIONAL RECURRENT LEFT FLANK HERNIA WITH MESH TAP BLOCK - BILATERAL;  Surgeon: Michael Boston, MD;  Location: [facility];  Service: General;  Laterality: N/A;   left leg broken     PROSTATE BIOPSY  2007   neg   ROTATOR CUFF REPAIR     bilateral; Dr. Noemi Chapel    There were no vitals filed for this visit.   Subjective Assessment - 09/08/21 0818     Subjective Pt with no new complaints. Still unable to sit in chair, sit for meals, or bend forward due to pain.    Currently in Pain? No/denies    Pain Score 0-No pain                               [facility] Adult PT Treatment/Exercise - 09/08/21 0001       Exercises   Exercises Lumbar      Lumbar Exercises: Stretches   Single Knee to Chest Stretch 3 reps;30 seconds;Right;Left    Lower Trunk Rotation 5 reps;10 seconds    Pelvic Tilt 15 reps      Lumbar Exercises: Standing   Functional  Squats 10 reps    Lifting 10 reps    Lifting Limitations education on posture for hip hinge motion , lifting light object from chair.      Lumbar Exercises: Supine   Ab Set 15 reps;3 seconds    Clam 5 reps    Clam Limitations Increased pain after 5;    Bent Knee Raise 15 reps    Bent Knee Raise Limitations with TA    Bridge 15 reps    Other Supine Lumbar Exercises supine shoulder flex with cane x 10      Lumbar Exercises: Quadruped   [place] 15 reps      Manual Therapy   Manual Therapy Soft tissue mobilization    Soft tissue mobilization STM , TPR to L lumbar, lateral at incision, and at lower rib attachment.                       PT Short Term Goals - 08/28/21 1020       PT SHORT TERM GOAL #1   Title Pt to be independent with initial HEP    Time 2    Period Weeks    Status New    Target Date 09/08/21               PT Long Term Goals - 08/28/21 1021       PT LONG TERM GOAL #1   Title Pt to be independent with final HEP    Time 6    Period Weeks    Status New    Target Date 10/06/21       PT LONG TERM GOAL #2   Title Pt to report decreased pain in back to 0-2/10 with lumbar flexion.    Time 6    Period Weeks    Status New    Target Date 10/06/21      PT LONG TERM GOAL #3   Title Pt to demo ability for lumbar flexion ROM , to at least 60 deg, to improve ability for bending, dressing, and IADLS.    Time 6    Period Weeks    Status New    Target Date 10/06/21      PT LONG TERM GOAL #4   Title Pt to report ability to sit upright in chair, for meals and relaxing/rest, for at least 15-30 min. without pain more than 2/10    Time 6    Period Weeks    Status New    Target Date 10/06/21                   Plan - 09/08/21 0820     Clinical Impression Statement Pt with less discomfort in lateral abdomen and hernia today from last visit. He has improved ability for TA without pain. Able to tolerate manual therapy to posterior scar/area of most pain. He is still unable to sit in chair comfortably, sit for meals, or bend forward due to onset of pain . Education and practice today for hip hinge motion with flat back, for transfers, and shallow squat. Will progress to further flexion next visit as tolerated. Pt with less pain with hip hinge, still unable to do actual lumbar flexion due to pain onset.    Personal Factors and Comorbidities Comorbidity 1    Comorbidities hernia repair march 2022    Examination-Activity Limitations Bathing;Lift;Bed Mobility;Toileting;Bend;Transfers;Self Feeding;Carry;Sit;Squat;Dressing;Hygiene/Grooming    Examination-Participation Restrictions Cleaning;Meal Prep;Yard Work;Community Activity;Driving;Shop    Stability/Clinical Decision  Making Evolving/Moderate complexity    Rehab Potential Fair    PT Frequency 2x / week    PT Duration 6 weeks    PT Treatment/Interventions ADLs/Self Care Home Management;Cryotherapy;Moist Heat;Traction;DME Instruction;Balance training;Therapeutic exercise;Therapeutic activities;Functional mobility  training;Stair training;Gait training;Neuromuscular re-education;Patient/family education;Manual techniques;Passive range of motion;Dry needling;Spinal Manipulations;Joint Manipulations;Scar mobilization    Consulted and Agree with Plan of Care Patient             Patient will benefit from skilled therapeutic intervention in order to improve the following deficits and impairments:  Pain, Decreased mobility, Increased muscle spasms, Decreased range of motion, Decreased activity tolerance, Impaired flexibility  Visit Diagnosis: Acute left-sided low back pain without sciatica     Problem List Patient Active Problem List   Diagnosis Date Noted   Chronic left flank pain 06/29/2021   Constipation, chronic 02/18/2021   Acute urinary retention 02/18/2021   Diverticular disease of colon 02/11/2021   Personal history of colonic polyps 02/11/2021   Colon cancer screening 02/11/2021   Recurrent left flank incisional hernia s/p lap repair w mesh 02/11/2021 02/11/2021   Postcalcaneal bursitis of left foot 04/09/2020   Disorder of left eustachian tube 08/14/2019   Vertigo 12/19/2018   Partial degenerative rupture of right biceps tendon 07/18/2017   Right lateral epicondylitis 07/18/2017   Hyperglycemia 04/18/2017   High foot arch 03/29/2016   Arthritis, midfoot 03/29/2016   Tibialis posterior tendinitis 03/29/2016   Left foot pain 03/28/2016   Mitral regurgitation 09/05/2013   Carotid artery disease without cerebral infarction [facility])    Hx of CABG    Ejection fraction    Preventative health care 08/30/2012   Atrial fibrillation ([place])    Thrombocytopenia ([place]) 07/26/2009   Headache(784.0) 07/15/2009   Hyperlipidemia 02/09/2009   Anxiety state 02/09/2009   Depression 02/09/2009   BENIGN PROSTATIC HYPERTROPHY 02/09/2009   PSA, INCREASED 02/09/2009   NEPHROLITHIASIS, HX OF 02/09/2009   CAD (coronary artery disease) 07/11/2008   Lyndee Hensen, PT, DPT 8:25 AM  09/08/21    [facility]  [facility] [place] [address] [place], [place], [address] Phone: [phone]   Fax:  [phone]  Name: CASHIUS GRANDSTAFF MRN: [id number] Date of Birth: June 20, 1941

## 2021-09-13 ENCOUNTER — Encounter: Payer: Medicare Other | Admitting: Physical Therapy

## 2021-09-13 ENCOUNTER — Ambulatory Visit (INDEPENDENT_AMBULATORY_CARE_PROVIDER_SITE_OTHER): Payer: Medicare Other | Admitting: Physical Therapy

## 2021-09-13 ENCOUNTER — Other Ambulatory Visit: Payer: Self-pay

## 2021-09-13 ENCOUNTER — Encounter: Payer: Self-pay | Admitting: Physical Therapy

## 2021-09-13 DIAGNOSIS — M545 Low back pain, unspecified: Secondary | ICD-10-CM

## 2021-09-13 NOTE — Therapy (Signed)
[facility] [address] [place], [place], [address] Phone: [phone]   Fax:  [phone]  Physical Therapy Treatment  Patient Details  Name: [name] MRN: [id number] Date of Birth: 04/29/1941 Referring Provider (PT): [name]   Encounter Date: 09/13/2021   PT End of Session - 09/13/21 1202     Visit Number 4    Number of Visits 12    Date for PT Re-Evaluation 10/06/21    Authorization Type Medicare    PT Start Time 1100    PT Stop Time 1138    PT Time Calculation (min) 38 min    Activity Tolerance Patient tolerated treatment well;Patient limited by pain    Behavior During Therapy [facility] for tasks assessed/performed             Past Medical History:  Diagnosis Date   Arthritis    Atrial fibrillation ([place])    post MI-amiodarone stopped 07/2008   Benign prostatic hypertrophy    CAD (coronary artery disease) 07/2008   2 BMS placed for MI, August, 2009   Carotid artery disease without cerebral infarction [facility])    CORONARY ARTERY BYPASS GRAFT, HX OF 07/13/2009   August, 2009, Dr. Cyndia Bent   Ejection fraction    .   History of kidney stones    History of nephrolithiasis    Hx of CABG    Hx of colonoscopy    Hyperlipidemia    HYPERLIPIDEMIA 02/09/2009   Qualifier: Diagnosis of  By: Jenny Reichmann MD, Hunt Oris    Increased prostate specific antigen (PSA) velocity    Myocardial infarction ([place])    NEPHROLITHIASIS, HX OF 02/09/2009   Qualifier: Diagnosis of  By: Jenny Reichmann MD, Hunt Oris    Prostatitis    PSA elevation 09/06/2012   Thrombocytopenia [facility])     Past Surgical History:  Procedure Laterality Date   CORONARY ARTERY BYPASS GRAFT  08/05/08   x5, LIMA to the LAD.    CORONARY STENT PLACEMENT  07/2008   2 BMS to RCA   [place]  04/16/2006   Left flank incisional hernia repair with mesh.  Dr Lonzo Cloud HERNIA REPAIR N/A 02/11/2021   Procedure: LAPAROSCOPIC REPAIR OF   INCISIONAL RECURRENT LEFT FLANK HERNIA WITH MESH TAP BLOCK - BILATERAL;  Surgeon: Michael Boston, MD;  Location: [facility];  Service: General;  Laterality: N/A;   left leg broken     PROSTATE BIOPSY  2007   neg   ROTATOR CUFF REPAIR     bilateral; Dr. Noemi Chapel    There were no vitals filed for this visit.   Subjective Assessment - 09/13/21 1201     Subjective Pt with no new complaints. Still having pain with fwd bending and sitting.    Currently in Pain? No/denies    Pain Score 0-No pain                               [facility] Adult PT Treatment/Exercise - 09/13/21 0001       Exercises   Exercises Lumbar      Lumbar Exercises: Stretches   Single Knee to Chest Stretch 3 reps;30 seconds;Right;Left    Lower Trunk Rotation 5 reps;10 seconds      Lumbar Exercises: Standing   Functional Squats 15 reps    Lifting 10 reps    Lifting Limitations  from 12 in and from floor x 10 ea;    Other Standing Lumbar Exercises Standing lumbar flexion x 5;      Lumbar Exercises: Seated   Sit to Stand 15 reps      Lumbar Exercises: Supine   Bridge 15 reps      Manual Therapy   Manual Therapy Soft tissue mobilization    Soft tissue mobilization STM , TPR to L lumbar, lateral at incision, and at lower rib attachment.                     PT Education - 09/13/21 1201     Education Details Reviewed HEP and PT plan, discussed return to MD in about 2 weeks.    Person(s) Educated Patient    Methods Demonstration;Explanation;Tactile cues;Verbal cues;Handout    Comprehension Verbalized understanding;Returned demonstration;Verbal cues required;Tactile cues required;Need further instruction              PT Short Term Goals - 08/28/21 1020       PT SHORT TERM GOAL #1   Title Pt to be independent with initial HEP    Time 2    Period Weeks    Status New    Target Date 09/08/21               PT Long Term Goals - 08/28/21 1021       PT LONG TERM GOAL #1    Title Pt to be independent with final HEP    Time 6    Period Weeks    Status New    Target Date 10/06/21      PT LONG TERM GOAL #2   Title Pt to report decreased pain in back to 0-2/10 with lumbar flexion.    Time 6    Period Weeks    Status New    Target Date 10/06/21      PT LONG TERM GOAL #3   Title Pt to demo ability for lumbar flexion ROM , to at least 60 deg, to improve ability for bending, dressing, and IADLS.    Time 6    Period Weeks    Status New    Target Date 10/06/21      PT LONG TERM GOAL #4   Title Pt to report ability to sit upright in chair, for meals and relaxing/rest, for at least 15-30 min. without pain more than 2/10    Time 6    Period Weeks    Status New    Target Date 10/06/21                   Plan - 09/13/21 1207     Clinical Impression Statement Pt with improved ability for lower squat today, as well as a few repititions for lumbar flexion. He did not have severe increase in pain today with this. Discussed trying to use fwd flexion to bend at home this week, a bit more, and see if pain is better with this motion. He also has less tenderness at area of scar on back with manual today. Discussed continuing PT for at least 1 more week, to improve strength, ability for bending, and pain relief.    Personal Factors and Comorbidities Comorbidity 1    Comorbidities hernia repair march 2022    Examination-Activity Limitations Bathing;Lift;Bed Mobility;Toileting;Bend;Transfers;Self Feeding;Carry;Sit;Squat;Dressing;Hygiene/Grooming    Examination-Participation Restrictions Cleaning;Meal Prep;Yard Work;Community Activity;Driving;Shop    Stability/Clinical Decision Making Evolving/Moderate complexity    Rehab Potential Fair  PT Frequency 2x / week    PT Duration 6 weeks    PT Treatment/Interventions ADLs/Self Care Home Management;Cryotherapy;Moist Heat;Traction;DME Instruction;Balance training;Therapeutic exercise;Therapeutic activities;Functional  mobility training;Stair training;Gait training;Neuromuscular re-education;Patient/family education;Manual techniques;Passive range of motion;Dry needling;Spinal Manipulations;Joint Manipulations;Scar mobilization    Consulted and Agree with Plan of Care Patient             Patient will benefit from skilled therapeutic intervention in order to improve the following deficits and impairments:  Pain, Decreased mobility, Increased muscle spasms, Decreased range of motion, Decreased activity tolerance, Impaired flexibility  Visit Diagnosis: Acute left-sided low back pain without sciatica     Problem List Patient Active Problem List   Diagnosis Date Noted   Chronic left flank pain 06/29/2021   Constipation, chronic 02/18/2021   Acute urinary retention 02/18/2021   Diverticular disease of colon 02/11/2021   Personal history of colonic polyps 02/11/2021   Colon cancer screening 02/11/2021   Recurrent left flank incisional hernia s/p lap repair w mesh 02/11/2021 02/11/2021   Postcalcaneal bursitis of left foot 04/09/2020   Disorder of left eustachian tube 08/14/2019   Vertigo 12/19/2018   Partial degenerative rupture of right biceps tendon 07/18/2017   Right lateral epicondylitis 07/18/2017   Hyperglycemia 04/18/2017   High foot arch 03/29/2016   Arthritis, midfoot 03/29/2016   Tibialis posterior tendinitis 03/29/2016   Left foot pain 03/28/2016   Mitral regurgitation 09/05/2013   Carotid artery disease without cerebral infarction [facility])    Hx of CABG    Ejection fraction    Preventative health care 08/30/2012   Atrial fibrillation ([place])    Thrombocytopenia ([place]) 07/26/2009   Headache(784.0) 07/15/2009   Hyperlipidemia 02/09/2009   Anxiety state 02/09/2009   Depression 02/09/2009   BENIGN PROSTATIC HYPERTROPHY 02/09/2009   PSA, INCREASED 02/09/2009   NEPHROLITHIASIS, HX OF 02/09/2009   CAD (coronary artery disease) 07/11/2008    Lyndee Hensen, PT, DPT 12:14 PM   09/13/21    [facility] [address] [place], [place], [address] Phone: [phone]   Fax:  [phone]  Name: REMBERT BROWE MRN: [id number] Date of Birth: February 03, 1941

## 2021-09-20 ENCOUNTER — Encounter: Payer: Self-pay | Admitting: Physical Therapy

## 2021-09-20 ENCOUNTER — Encounter: Payer: Medicare Other | Admitting: Physical Therapy

## 2021-09-20 ENCOUNTER — Other Ambulatory Visit: Payer: Self-pay

## 2021-09-20 ENCOUNTER — Ambulatory Visit (INDEPENDENT_AMBULATORY_CARE_PROVIDER_SITE_OTHER): Payer: Medicare Other | Admitting: Physical Therapy

## 2021-09-20 DIAGNOSIS — M545 Low back pain, unspecified: Secondary | ICD-10-CM

## 2021-09-20 NOTE — Therapy (Signed)
[facility] [address] [place], [place], [address] Phone: [phone]   Fax:  [phone]  Physical Therapy Treatment  Patient Details  Name: [name] MRN: [id number] Date of Birth: [birth year] Referring Provider (PT): [name]   Encounter Date: 09/20/2021   PT End of Session - 09/20/21 1113     Visit Number 5    Number of Visits 12    Date for PT Re-Evaluation 10/06/21    Authorization Type Medicare    PT Start Time 1106    PT Stop Time 1146    PT Time Calculation (min) 40 min    Activity Tolerance Patient tolerated treatment well;Patient limited by pain    Behavior During Therapy [facility] for tasks assessed/performed             Past Medical History:  Diagnosis Date   Arthritis    Atrial fibrillation ([place])    post MI-amiodarone stopped 07/2008   Benign prostatic hypertrophy    CAD (coronary artery disease) 07/2008   2 BMS placed for MI, August, 2009   Carotid artery disease without cerebral infarction [facility])    CORONARY ARTERY BYPASS GRAFT, HX OF 07/13/2009   August, 2009, Dr. Cyndia Bent   Ejection fraction    .   History of kidney stones    History of nephrolithiasis    Hx of CABG    Hx of colonoscopy    Hyperlipidemia    HYPERLIPIDEMIA 02/09/2009   Qualifier: Diagnosis of  By: Jenny Reichmann MD, Hunt Oris    Increased prostate specific antigen (PSA) velocity    Myocardial infarction ([place])    NEPHROLITHIASIS, HX OF 02/09/2009   Qualifier: Diagnosis of  By: Jenny Reichmann MD, Hunt Oris    Prostatitis    PSA elevation 09/06/2012   Thrombocytopenia [facility])     Past Surgical History:  Procedure Laterality Date   CORONARY ARTERY BYPASS GRAFT  08/05/08   x5, LIMA to the LAD.    CORONARY STENT PLACEMENT  07/2008   2 BMS to RCA   [place]  04/16/2006   Left flank incisional hernia repair with mesh.  Dr Lonzo Cloud HERNIA REPAIR N/A 02/11/2021   Procedure: LAPAROSCOPIC REPAIR OF   INCISIONAL RECURRENT LEFT FLANK HERNIA WITH MESH TAP BLOCK - BILATERAL;  Surgeon: Michael Boston, MD;  Location: [facility];  Service: General;  Laterality: N/A;   left leg broken     PROSTATE BIOPSY  2007   neg   ROTATOR CUFF REPAIR     bilateral; Dr. Noemi Chapel    There were no vitals filed for this visit.   Subjective Assessment - 09/20/21 1112     Subjective Pt states he thinks he has had less pain this week. He has been wearing soft abdominal brace. Thinks pain in back may be a bit better, but also states pain in anterior/lateral abdoment, area of another hernia, that is sore when he takes brace off.    Currently in Pain? No/denies    Pain Score 0-No pain                               [facility] Adult PT Treatment/Exercise - 09/20/21 0001       Lumbar Exercises: Stretches   Active Hamstring Stretch 2 reps;30 seconds    Active Hamstring Stretch Limitations seated    Single Knee  to Chest Stretch 3 reps;30 seconds;Right;Left      Lumbar Exercises: Aerobic   Recumbent Bike L 1 x 8 min;      Lumbar Exercises: Standing   Functional Squats 15 reps    Other Standing Lumbar Exercises Standing lumbar flexion x 5;      Lumbar Exercises: Seated   Sit to Stand 10 reps    Other Seated Lumbar Exercises static sitting x 4 min, seated in fwd flexed position x 2 min;      Lumbar Exercises: Supine   Bridge 15 reps    Other Supine Lumbar Exercises supine shoulder flex with cane x 10      Manual Therapy   Manual Therapy Manual Traction    Soft tissue mobilization STM , TPR to L lumbar, lateral at incision, and at lower rib attachment.    Manual Traction long leg distraction on L forlumbar pump x 2 min;                       PT Short Term Goals - 08/28/21 1020       PT SHORT TERM GOAL #1   Title Pt to be independent with initial HEP    Time 2    Period Weeks    Status New    Target Date 09/08/21               PT Long Term Goals - 08/28/21 1021        PT LONG TERM GOAL #1   Title Pt to be independent with final HEP    Time 6    Period Weeks    Status New    Target Date 10/06/21      PT LONG TERM GOAL #2   Title Pt to report decreased pain in back to 0-2/10 with lumbar flexion.    Time 6    Period Weeks    Status New    Target Date 10/06/21      PT LONG TERM GOAL #3   Title Pt to demo ability for lumbar flexion ROM , to at least 60 deg, to improve ability for bending, dressing, and IADLS.    Time 6    Period Weeks    Status New    Target Date 10/06/21      PT LONG TERM GOAL #4   Title Pt to report ability to sit upright in chair, for meals and relaxing/rest, for at least 15-30 min. without pain more than 2/10    Time 6    Period Weeks    Status New    Target Date 10/06/21                   Plan - 09/20/21 1233     Clinical Impression Statement Pt able to tolerate much more flexion and bending today than in previous sessions. He was able to sit longer, on bike, and with stretching, as well as perform squats without significant pain. Discussed more trials at home, for sitting at table, and bending forward, to see if he can tolerate these activities. Pt has been avoiding flexion motions for months, but those are the main aggravating factors. Discussed continued use of soft abdominal brace if he thinks it is helping pain. Pt seems to have less pain today, and be more tolerant of flexion and bending, he will benefit from continued care.    Personal Factors and Comorbidities Comorbidity 1    Comorbidities hernia repair  march 2022    Examination-Activity Limitations Bathing;Lift;Bed Mobility;Toileting;Bend;Transfers;Self Feeding;Carry;Sit;Squat;Dressing;Hygiene/Grooming    Examination-Participation Restrictions Cleaning;Meal Prep;Yard Work;Community Activity;Driving;Shop    Stability/Clinical Decision Making Evolving/Moderate complexity    Rehab Potential Fair    PT Frequency 2x / week    PT Duration 6 weeks    PT  Treatment/Interventions ADLs/Self Care Home Management;Cryotherapy;Moist Heat;Traction;DME Instruction;Balance training;Therapeutic exercise;Therapeutic activities;Functional mobility training;Stair training;Gait training;Neuromuscular re-education;Patient/family education;Manual techniques;Passive range of motion;Dry needling;Spinal Manipulations;Joint Manipulations;Scar mobilization    Consulted and Agree with Plan of Care Patient             Patient will benefit from skilled therapeutic intervention in order to improve the following deficits and impairments:  Pain, Decreased mobility, Increased muscle spasms, Decreased range of motion, Decreased activity tolerance, Impaired flexibility  Visit Diagnosis: Acute left-sided low back pain without sciatica     Problem List Patient Active Problem List   Diagnosis Date Noted   Chronic left flank pain 06/29/2021   Constipation, chronic 02/18/2021   Acute urinary retention 02/18/2021   Diverticular disease of colon 02/11/2021   Personal history of colonic polyps 02/11/2021   Colon cancer screening 02/11/2021   Recurrent left flank incisional hernia s/p lap repair w mesh 02/11/2021 02/11/2021   Postcalcaneal bursitis of left foot 04/09/2020   Disorder of left eustachian tube 08/14/2019   Vertigo 12/19/2018   Partial degenerative rupture of right biceps tendon 07/18/2017   Right lateral epicondylitis 07/18/2017   Hyperglycemia 04/18/2017   High foot arch 03/29/2016   Arthritis, midfoot 03/29/2016   Tibialis posterior tendinitis 03/29/2016   Left foot pain 03/28/2016   Mitral regurgitation 09/05/2013   Carotid artery disease without cerebral infarction [facility])    Hx of CABG    Ejection fraction    Preventative health care 08/30/2012   Atrial fibrillation ([place])    Thrombocytopenia ([place]) 07/26/2009   Headache(784.0) 07/15/2009   Hyperlipidemia 02/09/2009   Anxiety state 02/09/2009   Depression 02/09/2009   BENIGN PROSTATIC HYPERTROPHY  02/09/2009   PSA, INCREASED 02/09/2009   NEPHROLITHIASIS, HX OF 02/09/2009   CAD (coronary artery disease) 07/11/2008   Lyndee Hensen, PT, DPT 12:36 PM  09/20/21    [facility] [address] [place], [place], [address] Phone: [phone]   Fax:  [phone]  Name: [name] MRN: [id number] Date of Birth: [birth year]

## 2021-09-22 ENCOUNTER — Encounter: Payer: Medicare Other | Admitting: Physical Therapy

## 2021-09-29 ENCOUNTER — Other Ambulatory Visit: Payer: Self-pay

## 2021-09-29 ENCOUNTER — Ambulatory Visit (INDEPENDENT_AMBULATORY_CARE_PROVIDER_SITE_OTHER): Payer: Medicare Other | Admitting: Physical Therapy

## 2021-09-29 ENCOUNTER — Encounter: Payer: Self-pay | Admitting: Physical Therapy

## 2021-09-29 DIAGNOSIS — M545 Low back pain, unspecified: Secondary | ICD-10-CM | POA: Diagnosis not present

## 2021-10-02 ENCOUNTER — Encounter: Payer: Self-pay | Admitting: Physical Therapy

## 2021-10-02 NOTE — Therapy (Signed)
[facility] [address] [place], [place], [address] Phone: [phone]   Fax:  [phone]  Physical Therapy Treatment  Patient Details  Name: [name] MRN: [id number] Date of Birth: 10-18-1941 Referring Provider (PT): [name]   Encounter Date: 09/29/2021   PT End of Session - 10/02/21 2050     Visit Number 6    Number of Visits 12    Date for PT Re-Evaluation 10/06/21    Authorization Type Medicare    PT Start Time 1016    PT Stop Time 1100    PT Time Calculation (min) 44 min    Activity Tolerance Patient tolerated treatment well;Patient limited by pain    Behavior During Therapy [facility] for tasks assessed/performed             Past Medical History:  Diagnosis Date   Arthritis    Atrial fibrillation ([place])    post MI-amiodarone stopped 07/2008   Benign prostatic hypertrophy    CAD (coronary artery disease) 07/2008   2 BMS placed for MI, August, 2009   Carotid artery disease without cerebral infarction [facility])    CORONARY ARTERY BYPASS GRAFT, HX OF 07/13/2009   August, 2009, Dr. Cyndia Bent   Ejection fraction    .   History of kidney stones    History of nephrolithiasis    Hx of CABG    Hx of colonoscopy    Hyperlipidemia    HYPERLIPIDEMIA 02/09/2009   Qualifier: Diagnosis of  By: Jenny Reichmann MD, Hunt Oris    Increased prostate specific antigen (PSA) velocity    Myocardial infarction ([place])    NEPHROLITHIASIS, HX OF 02/09/2009   Qualifier: Diagnosis of  By: Jenny Reichmann MD, Hunt Oris    Prostatitis    PSA elevation 09/06/2012   Thrombocytopenia [facility])     Past Surgical History:  Procedure Laterality Date   CORONARY ARTERY BYPASS GRAFT  08/05/08   x5, LIMA to the LAD.    CORONARY STENT PLACEMENT  07/2008   2 BMS to RCA   [place]  04/16/2006   Left flank incisional hernia repair with mesh.  Dr Lonzo Cloud HERNIA REPAIR N/A 02/11/2021   Procedure: LAPAROSCOPIC REPAIR OF   INCISIONAL RECURRENT LEFT FLANK HERNIA WITH MESH TAP BLOCK - BILATERAL;  Surgeon: Michael Boston, MD;  Location: [facility];  Service: General;  Laterality: N/A;   left leg broken     PROSTATE BIOPSY  2007   neg   ROTATOR CUFF REPAIR     bilateral; Dr. Noemi Chapel    There were no vitals filed for this visit.   Subjective Assessment - 10/02/21 2042     Subjective Pt reports decreasing pain intensity. He still has soreness, increased with bending, but has not gotten too bad . He admits that he is still not sitting upright for meals, but does report ability for doing more activity around the house. Washed his car last week, without diffiuclty. Tried to swing golf club, did have increased pain. He is also still wearing abdominal brace, that he thinks is helpful.    Currently in Pain? No/denies    Pain Score 0-No pain                               [facility] Adult PT Treatment/Exercise - 10/02/21 0001       Lumbar  Exercises: Stretches   Active Hamstring Stretch 2 reps;30 seconds    Active Hamstring Stretch Limitations seated    Single Knee to Chest Stretch 3 reps;30 seconds;Right;Left      Lumbar Exercises: Aerobic   Recumbent Bike L 1 x 8 min;      Lumbar Exercises: Standing   Functional Squats 20 reps    Other Standing Lumbar Exercises Standing lumbar flexion x 10; Slow/controlled swing of golf club x 10;    Other Standing Lumbar Exercises Step ups x 10 bil 6 in step, no HR.      Lumbar Exercises: Seated   Sit to Stand 10 reps      Lumbar Exercises: Supine   Bridge 15 reps      Manual Therapy   Manual Therapy Manual Traction                     PT Education - 10/02/21 2042     Education Details Reviewed HEP, discussed activity recommendations, and trying to return to normal    Person(s) Educated Patient    Methods Explanation;Demonstration;Verbal cues    Comprehension Verbalized understanding;Returned demonstration;Verbal cues required;Need further  instruction              PT Short Term Goals - 10/02/21 2050       PT SHORT TERM GOAL #1   Title Pt to be independent with initial HEP    Time 2    Period Weeks    Status Achieved    Target Date 09/08/21               PT Long Term Goals - 08/28/21 1021       PT LONG TERM GOAL #1   Title Pt to be independent with final HEP    Time 6    Period Weeks    Status New    Target Date 10/06/21      PT LONG TERM GOAL #2   Title Pt to report decreased pain in back to 0-2/10 with lumbar flexion.    Time 6    Period Weeks    Status New    Target Date 10/06/21      PT LONG TERM GOAL #3   Title Pt to demo ability for lumbar flexion ROM , to at least 60 deg, to improve ability for bending, dressing, and IADLS.    Time 6    Period Weeks    Status New    Target Date 10/06/21      PT LONG TERM GOAL #4   Title Pt to report ability to sit upright in chair, for meals and relaxing/rest, for at least 15-30 min. without pain more than 2/10    Time 6    Period Weeks    Status New    Target Date 10/06/21                   Plan - 10/02/21 2051     Clinical Impression Statement Pt wtih improving ability for activity in sessions. He has been able to do quite a bit more flexion and bending during the last 2 sessions without increased  pain like he had previously. He does have mild soreness at end of session today, after several exercises, as well as (lightly) swinging a golf club. He will benefit from continued progression of strengthening with focus on improving flexion tolerance. Continued to discuss need and importance for trying, and not avoiding flexion at home, with  sitting in chair, and performing forward bending for IADLs. Pt has avoided this for so long that he is still not using regular mechanics at home and with several functional activities.    Personal Factors and Comorbidities Comorbidity 1    Comorbidities hernia repair march 2022    Examination-Activity  Limitations Bathing;Lift;Bed Mobility;Toileting;Bend;Transfers;Self Feeding;Carry;Sit;Squat;Dressing;Hygiene/Grooming    Examination-Participation Restrictions Cleaning;Meal Prep;Yard Work;Community Activity;Driving;Shop    Stability/Clinical Decision Making Evolving/Moderate complexity    Rehab Potential Fair    PT Frequency 2x / week    PT Duration 6 weeks    PT Treatment/Interventions ADLs/Self Care Home Management;Cryotherapy;Moist Heat;Traction;DME Instruction;Balance training;Therapeutic exercise;Therapeutic activities;Functional mobility training;Stair training;Gait training;Neuromuscular re-education;Patient/family education;Manual techniques;Passive range of motion;Dry needling;Spinal Manipulations;Joint Manipulations;Scar mobilization    Consulted and Agree with Plan of Care Patient             Patient will benefit from skilled therapeutic intervention in order to improve the following deficits and impairments:  Pain, Decreased mobility, Increased muscle spasms, Decreased range of motion, Decreased activity tolerance, Impaired flexibility  Visit Diagnosis: Acute left-sided low back pain without sciatica     Problem List Patient Active Problem List   Diagnosis Date Noted   Chronic left flank pain 06/29/2021   Constipation, chronic 02/18/2021   Acute urinary retention 02/18/2021   Diverticular disease of colon 02/11/2021   Personal history of colonic polyps 02/11/2021   Colon cancer screening 02/11/2021   Recurrent left flank incisional hernia s/p lap repair w mesh 02/11/2021 02/11/2021   Postcalcaneal bursitis of left foot 04/09/2020   Disorder of left eustachian tube 08/14/2019   Vertigo 12/19/2018   Partial degenerative rupture of right biceps tendon 07/18/2017   Right lateral epicondylitis 07/18/2017   Hyperglycemia 04/18/2017   High foot arch 03/29/2016   Arthritis, midfoot 03/29/2016   Tibialis posterior tendinitis 03/29/2016   Left foot pain 03/28/2016   Mitral  regurgitation 09/05/2013   Carotid artery disease without cerebral infarction [facility])    Hx of CABG    Ejection fraction    Preventative health care 08/30/2012   Atrial fibrillation ([place])    Thrombocytopenia ([place]) 07/26/2009   Headache(784.0) 07/15/2009   Hyperlipidemia 02/09/2009   Anxiety state 02/09/2009   Depression 02/09/2009   BENIGN PROSTATIC HYPERTROPHY 02/09/2009   PSA, INCREASED 02/09/2009   NEPHROLITHIASIS, HX OF 02/09/2009   CAD (coronary artery disease) 07/11/2008    Lyndee Hensen, PT, DPT 9:11 PM  10/02/21    [facility] [address] [place], [place], [address] Phone: [phone]   Fax:  [phone]  Name: [name] MRN: [id number] Date of Birth: Apr 24, 1941

## 2021-10-04 ENCOUNTER — Encounter: Payer: Medicare Other | Admitting: Physical Therapy

## 2021-10-11 ENCOUNTER — Encounter: Payer: Self-pay | Admitting: Physical Therapy

## 2021-10-11 ENCOUNTER — Ambulatory Visit (INDEPENDENT_AMBULATORY_CARE_PROVIDER_SITE_OTHER): Payer: Medicare Other | Admitting: Physical Therapy

## 2021-10-11 ENCOUNTER — Other Ambulatory Visit: Payer: Self-pay

## 2021-10-11 DIAGNOSIS — M545 Low back pain, unspecified: Secondary | ICD-10-CM | POA: Diagnosis not present

## 2021-10-11 NOTE — Patient Instructions (Signed)
Access Code: [email or web address] URL: [email or web address] Date: 10/11/2021 Prepared by: Lyndee Hensen  Exercises Seated Hamstring Stretch - 2 x daily - 3 reps - 30 hold Child's Pose with Sidebending - 2 x daily - 3 reps - 30 hold Single Knee to Chest Stretch - 2 x daily - 3 reps - 30 hold Supine Lower Trunk Rotation - 2 x daily - 10 reps - 5 hold Supine Transversus Abdominis Bracing - Hands on Stomach - 1 x daily - 1 sets - 10 reps - 3 hold Supine March - 1 x daily - 2 sets - 10 reps Supine Bridge - 1 x daily - 1 sets - 10 reps Sit to Stand - 1 x daily - 2 sets - 10 reps Standing Anti-Rotation Press with Anchored Resistance - 1 x daily - 2 sets - 10 reps

## 2021-10-11 NOTE — Progress Notes (Signed)
I, [name], LAT, ATC, am serving as scribe for Dr. [name].  WASHINGTON WHEDBEE is a 80 y.o. male who presents to [place] at [facility] today for f/u of L-sided LBP/flank pain in the location of a prior L flank hernia repair.  He was last seen by Dr. Georgina Snell on 08/10/21 and noted no change in his symptoms.  He voiced con't frustration about not being able to return to normal activity.  He was referred to PT at [facility] and has completed 7 sessions.  Today, pt reports that he feels slightly improved but still is not happy w/ how he's doing/feeling.  He states that PT is not hurting him but can't really say that he's feeling much better.  He states that he can walk w/o much difficulty and he's sleeping ok.  He reports that he still cannot play golf which is frustrating.  Diagnostic imaging: Abdominal MRI- 08/07/21; L-spine MRI- 07/17/21  Pertinent review of systems: No fevers or chills.  No radiating pain or weakness distal lower extremities.  Relevant historical information: History of left flank hernia with revision most recently March 2022.  Dr. Johney Maine.  Operative report is attached to the bottom of this note.   Exam:  BP 130/72 (BP Location: Right Arm, Patient Position: Sitting, Cuff Size: Normal)   Pulse (!) 57   Ht 5\' 4"  (1.626 m)   Wt 136 lb 6.4 oz (61.9 kg)   SpO2 98%   BMI 23.41 kg/m  General: Well Developed, well nourished, and in no acute distress.   MSK: Left flank: Large scar left flank.  Tender palpation posterior left abdomen wall to lumbar spine paraspinal muscular area. Pain with rotation and lateral flexion. Lower extremity strength is intact.    Lab and Radiology Results  EXAM: MRI ABDOMEN WITHOUT CONTRAST   TECHNIQUE: Multiplanar multisequence MR imaging was performed without the administration of intravenous contrast.   COMPARISON:  None.   FINDINGS: Bones/Joint/Cartilage   No fracture or dislocation. Normal alignment. No joint effusion.  No marrow signal abnormality. Bilateral facet arthropathy at L4-5 and L5-S1.   Muscles and Tendons   Muscles are normal.   Soft tissue No fluid collection or hematoma. No soft tissue mass. Left lateral and posterior abdominal wall hernia repair with postsurgical changes in the overlying subcutaneous fat. Tiny cystic area measuring 10 mm likely reflecting a seroma. No recurrent abdominal wall hernia.   No focal abnormality of the liver, gallbladder, kidneys, pancreas or spleen. No bowel dilatation. Sigmoid diverticulosis without evidence of diverticulitis. No abdominal or pelvic free fluid. No bowel dilatation.   IMPRESSION: 1. Left lateral and posterior abdominal wall hernia repair with postsurgical changes in the overlying subcutaneous fat. Tiny cystic area measuring 10 mm likely reflecting a seroma. No recurrent abdominal wall hernia.     Electronically Signed   By: Kathreen Devoid M.D.   On: 08/08/2021 14:58  EXAM: MRI LUMBAR SPINE WITHOUT CONTRAST   TECHNIQUE: Multiplanar, multisequence MR imaging of the lumbar spine was performed. No intravenous contrast was administered.   COMPARISON:  None.   FINDINGS: Segmentation:  Standard.   Alignment: Trace anterolisthesis at L5-S1 likely secondary to chronic bilateral pars breaks.   Vertebrae: Vertebral body heights are maintained. There is no significant marrow edema. There is no suspicious osseous lesion.   Conus medullaris and cauda equina: Conus extends to the L1-L2 level. Conus and cauda equina appear normal.   Paraspinal and other soft tissues: Skin marker overlies the posterior  left lateral soft tissues at the L2 level. This is partially imaged on the axial series. On the coronal series, there is some heterogeneous signal presumably reflecting postoperative changes. No well-defined fluid collection.   Disc levels: Intervertebral disc heights are maintained.   L1-L2:  No canal or foraminal stenosis.    L2-L3:  No canal or foraminal stenosis.   L3-L4: Mild facet arthropathy with ligamentum flavum infolding. No canal or foraminal stenosis.   L4-L5: Punctate central annular fissure. Mild to moderate facet arthropathy with ligamentum flavum infolding. Small intraosseous synovial cyst on the left. No canal or foraminal stenosis.   L5-S1: Anterolisthesis with uncovering of disc bulge. Mild facet arthropathy. No canal or foraminal stenosis.   IMPRESSION: Trace anterolisthesis at L5-S1 likely secondary to chronic bilateral pars breaks. Facet predominant degenerative changes without high-grade stenosis.   Heterogeneous signal underlying left flank skin marker is imaged only on the coronal series and presumably reflects postoperative changes. No well-defined fluid collection.     Electronically Signed   By: Macy Mis M.D.   On: 07/18/2021 09:57    I, [name], personally (independently) visualized and performed the interpretation of the images attached in this note.    Assessment and Plan: 80 y.o. male with   Left low back pain to left flank pain.  Pain occurred in the setting of a surgical revision to a left flank hernia.  This was his third surgery in this area.  He has failed to improve despite extensive conservative management including dedicated trials of physical therapy focused on core strengthening.  Additionally he has tried trials of medication including muscle relaxers and gabapentin etc.  At this point he can has not been able to return to his normal level of activity including golf or being able to sit up in a chair unassisted by pillows comfortably.  I think at this point it is reasonable to consider other options.  Plan to refer to physical medicine and rehab to consider potential nerve block or ablation of intercostal nerves or similar to try to achieve better pain control to this area.  Will refer to Dr. Ernestina Patches.  Additionally he would like a second opinion  from a general surgeon to make sure there is no other potential options.  I think this is reasonable but I do not think there will likely be any good surgical options.  Plan to refer outside of Dr. Clyda Greener group to [facility].  Advised patient to make sure he gets a copy of his MRI images and bring with him to the general surgery consultation. For convenience a copy of the operative report is attached to the bottom of this note.   PDMP not reviewed this encounter. Orders Placed This Encounter  Procedures   Ambulatory referral to Physical Medicine Rehab    Referral Priority:   Routine    Referral Type:   Rehabilitation    Referral Reason:   Specialty Services Required    Referred to Provider:   Magnus Sinning, MD    Requested Specialty:   Physical Medicine and Rehabilitation    Number of Visits Requested:   1   Ambulatory referral to General Surgery    Referral Priority:   Routine    Referral Type:   Surgical    Referral Reason:   Specialty Services Required    Requested Specialty:   General Surgery    Number of Visits Requested:   1   No orders of the defined types were placed in this  encounter.    Discussed warning signs or symptoms. Please see discharge instructions. Patient expresses understanding.   The above documentation has been reviewed and is accurate and complete [name], M.D.   Total encounter time 40 minutes including face-to-face time with the patient and, reviewing past medical record, and charting on the date of service.   Extensive discussion of treatment plan and options   Attached operative report Dr Johney Maine 02/11/21  Op Note by Michael Boston, MD at 02/11/2021 10:18 AM  Author: Michael Boston, MD Service: Surgery Author Type: Physician  Filed: 02/11/2021 11:07 AM Date of Service: 02/11/2021 10:18 AM Status: Signed  Editor: Michael Boston, MD (Physician)                                                                                                                       02/11/2021   PATIENT:  Nonnie Done  80 y.o. male   Patient Care Team: Biagio Borg, MD as PCP - General Stanford Breed Denice Bors, MD as PCP - Cardiology (Cardiology) Michael Boston, MD as Consulting Physician (General Surgery) Irine Seal, MD as Attending Physician (Urology) Carlena Bjornstad, MD (Cardiology)   PRE-OPERATIVE DIAGNOSIS:  ventral incisional recurrent and incarcerated abdominal wall hernia, left inguinal hernia   POST-OPERATIVE DIAGNOSIS:  Recurrent incisional flank hernia    PROCEDURE:   LAPAROSCOPIC REPAIR OF  INCISIONAL RECURRENT LEFT FLANK HERNIA WITH MESH TAP BLOCK - BILATERAL   SURGEON:  [name], MD   ASSISTANT:  Lonia Skinner, MD, [facility] PGY-5   ANESTHESIA:      General   Nerve block provided with liposomal bupivacaine (Experel) mixed with 0.25% bupivacaine as a Bilateral TAP block x 64mL each side at the level of the transverse abdominis & preperitoneal spaces along the flank at the anterior axillary line, from subcostal ridge to iliac crest under laparoscopic guidance    EBL:  Total I/O In: 1000 [I.V.:1000] Out: 64 [Blood:50]  Per anesthesia record   Delay start of Pharmacological VTE agent (>24hrs) due to surgical blood loss or risk of bleeding:  no   DRAINS: none    SPECIMEN:  No Specimen   DISPOSITION OF SPECIMEN:  N/A   COUNTS:  YES   PLAN OF CARE: Admit for overnight observation   PATIENT DISPOSITION:  PACU - hemodynamically stable.   INDICATION: Pleasant patient has developed a ventral wall abdominal hernia.  History of prior left kidney surgery with flank hernia status post open primary repair with Swiss cheese recurrence containing colon.  Recommendation was made for surgical repair   The anatomy & physiology of the abdominal wall was discussed. The pathophysiology of hernias was discussed. Natural history risks without surgery including progeressive enlargement, pain, incarceration &  strangulation was discussed. Contributors to complications such as smoking, obesity, diabetes, prior surgery, etc were discussed.  I feel the risks of no intervention will lead to serious problems that outweigh the operative risks; therefore, I recommended surgery to reduce and repair the hernia. I  explained laparoscopic techniques with possible need for an open approach. I noted the probable use of mesh to patch and/or buttress the hernia repair.   Risks such as bleeding, infection, abscess, need for further treatment, heart attack, death, and other risks were discussed. I noted a good likelihood this will help address the problem. Goals of post-operative recovery were discussed as well. Possibility that this will not correct all symptoms was explained. I stressed the importance of low-impact activity, aggressive pain control, avoiding constipation, & not pushing through pain to minimize risk of post-operative chronic pain or injury. Possibility of reherniation especially with smoking, obesity, diabetes, immunosuppression, and other health conditions was discussed. We will work to minimize complications.    An educational handout further explaining the pathology & treatment options was given as well. Questions were answered. The patient expresses understanding & wishes to proceed with surgery.    OR FINDINGS: 15 x 6 cm Swiss cheese region along the left flank with moderate diastases bowing at old flank incision.  Moderate omental adhesions but no strangulation.   Type of repair: Laparoscopic underlay repair.  Primary repair of part of hernia              Placement of mesh: Retroperitoneal & intraperitoneal   Name of mesh: Bard Ventralight dual sided (polypropylene / Seprafilm)   Size of mesh: 33x27cm   Orientation: Transverse   Mesh overlap:  5-7cm     DESCRIPTION:    Informed consent was confirmed. The patient underwent general anaesthesia without difficulty. The patient was positioned  appropriately left side up decubitus in flex with careful protection of extremities. VTE prevention in place. The patient's abdomen was clipped, prepped, & draped in a sterile fashion. Surgical timeout confirmed our plan.  The patient was positioned in reverse Trendelenburg. Abdominal entry was gained using Varess & then optical entry technique in the left upper abdomen. Entry was clean. I induced carbon dioxide insufflation. Camera inspection revealed no injury. Extra ports were carefully placed under direct laparoscopic visualization.    I could see adhesions on the parietal peritoneum under the abdominal wall.   I did laparoscopic lysis of adhesions to expose the entire anterior abdominal wall.  Freed off greater omentum from the midline and left side.  I then mobilized the left colon and left kidney in a lateral to medial fashion to the left posterior retroperitoneum.  Saw the left ureter in its course and rotated that more medially.  I primarily used focused sharp dissection.  I made sure hemostasis was good.  Patient had moderate diastases and bowing of the Swiss cheese hernia of a moderate region.  He had concerns about old stitches causing pain so I removed the majority him along the left posterior lateral costal ridge where he had noted some occasional pains.  I mapped out the region using a needle passer.   To ensure that I would have at least 5 cm radial coverage outside of the hernia defect, I chose a 33x27cm dual sided mesh.  I placed #1 Prolene stitches around the left posterior lateral paraspinal border x2 and 4 on the medial edge x4 = 6 total.  I rolled the mesh & placed into the peritoneal cavity through one of the Swiss cheese hernias around the mid axillary line.  I unrolled the mesh and positioned it appropriately.   We then used a spinal needle to help map out the region and used a suture passer to secure the mesh on the left retroperitoneum x2.  I then brought up the medial corners along  the left anterior paramedian abdomen.  This help center the mesh for good control.  We then brought out the remaining sutures on the left paraspinal retroperitoneal sutures and tied those down.  I then used a secure strap tacker to help tack the mesh along the retroperitoneum, iliac crest, left costal ridge, came more medially.  Brought the 4 sutures on the medial and closer to the left anterior paramedian region up using laparoscopic suture guidance.  Because this was a giant mesh I also had #1 Prolene was passed through the center of the mesh between iliac crest and left subcostal ridge superior and inferior to the hernias.  Did 5 central sutures.  We then used #1 PDS to close the hernia defect that we have passed the mesh through using laparoscopic suture passer x2.  This allowed the mesh to be centrally tacked in 4 locations with good transfascial sutures.  Of note we used a few of those transfacial sutures to help tack the left colon back along the line of Toldt by taking some good bites of tissue and epiploic appendages, taking care to avoid injuring the colon.   After this the mesh laid well and had good central and paraspinal and anterior paramedian fascial securing.  SecureStrap absorbable tacks around the edges and centrally.  Brought greater omentum to help cover up the region again.  Hemostasis was good.  Mesh laid well. I completed a broad field block of local anesthesia at fascial stitch sites & fascial closure areas.     Capnoperitoneum was evacuated. Ports were removed. The skin was closed with Monocryl at the port sites and Steri-Strips on the fascial stitch puncture sites.  Patient is being extubated to go to the recovery room.  I discussed operative findings, updated the patient's status, discussed probable steps to recovery, and gave postoperative recommendations to the patient's spouse.  Recommendations were made.  Questions were answered.  She expressed understanding &  appreciation.  [name], M.D., F.A.C.S. Gastrointestinal and Minimally Invasive Surgery [facility] [address], [place], [place] [address] [phone] Main / Paging   02/11/2021 10:18 AM

## 2021-10-11 NOTE — Therapy (Addendum)
[facility] [address] [place], [place], [address] Phone: [phone]   Fax:  [phone]  Physical Therapy Treatment/Re-Cert/Discharge  Patient Details  Name: [name] MRN: [id number] Date of Birth: [birth year] Referring Provider (PT): [name]   Encounter Date: 10/11/2021   PT End of Session - 10/11/21 1359     Visit Number 7    Number of Visits 12    Date for PT Re-Evaluation 10/11/21    Authorization Type Medicare    PT Start Time 1100    PT Stop Time 1143    PT Time Calculation (min) 43 min    Activity Tolerance Patient tolerated treatment well;Patient limited by pain    Behavior During Therapy [facility] for tasks assessed/performed             Past Medical History:  Diagnosis Date   Arthritis    Atrial fibrillation ([place])    post MI-amiodarone stopped 07/2008   Benign prostatic hypertrophy    CAD (coronary artery disease) 07/2008   2 BMS placed for MI, August, 2009   Carotid artery disease without cerebral infarction [facility])    CORONARY ARTERY BYPASS GRAFT, HX OF 07/13/2009   August, 2009, Dr. Cyndia Bent   Ejection fraction    .   History of kidney stones    History of nephrolithiasis    Hx of CABG    Hx of colonoscopy    Hyperlipidemia    HYPERLIPIDEMIA 02/09/2009   Qualifier: Diagnosis of  By: Jenny Reichmann MD, Hunt Oris    Increased prostate specific antigen (PSA) velocity    Myocardial infarction ([place])    NEPHROLITHIASIS, HX OF 02/09/2009   Qualifier: Diagnosis of  By: Jenny Reichmann MD, Hunt Oris    Prostatitis    PSA elevation 09/06/2012   Thrombocytopenia [facility])     Past Surgical History:  Procedure Laterality Date   CORONARY ARTERY BYPASS GRAFT  08/05/08   x5, LIMA to the LAD.    CORONARY STENT PLACEMENT  07/2008   2 BMS to RCA   [place]  04/16/2006   Left flank incisional hernia repair with mesh.  Dr Lonzo Cloud HERNIA REPAIR N/A 02/11/2021   Procedure:  LAPAROSCOPIC REPAIR OF  INCISIONAL RECURRENT LEFT FLANK HERNIA WITH MESH TAP BLOCK - BILATERAL;  Surgeon: Michael Boston, MD;  Location: [facility];  Service: General;  Laterality: N/A;   left leg broken     PROSTATE BIOPSY  2007   neg   ROTATOR CUFF REPAIR     bilateral; Dr. Noemi Chapel    There were no vitals filed for this visit.   Subjective Assessment - 10/11/21 1357     Subjective Pt states increased pain after end of last session when he swung a golf club a few times. He has been doing exercises and regular activities. He still feels limited with housework and outdoor work, and is unable to play golf. He states soreness persists, and increased pain comes with increased bending/flexion of back.    Currently in Pain? Yes    Pain Score 2     Pain Location Abdomen    Pain Orientation Left    Pain Descriptors / Indicators Aching    Pain Type Chronic pain    Pain Onset More than a month ago    Pain Frequency Intermittent                [facility]  PT Assessment - 10/11/21 0001       Assessment   Medical Diagnosis Flank pain, back pain    Referring Provider (PT) [name]    Prior Therapy no      Precautions   Precaution Comments previous hernia repair      Balance Screen   Has the patient fallen in the past 6 months No      Prior Function   Level of Independence Independent      Cognition   Overall Cognitive Status Within Functional Limits for tasks assessed      Observation/Other Assessments   Observations L abdomen: Large incision from 1st hernia surgery (10 yrs ago); smaller incisions from recent hernia surgery (2022)      AROM   Overall AROM Comments Lumbar flexion: mod limitation, Hips: WNL      Strength   Overall Strength Comments Hips: 4+/5, knee: 5/5      Palpation   Palpation comment Tenderness at mid scar, and at lateral/lower rib just superior to scar                           [facility] Adult PT Treatment/Exercise - 10/11/21 0001       Lumbar  Exercises: Stretches   Active Hamstring Stretch 2 reps;30 seconds    Active Hamstring Stretch Limitations seated    Single Knee to Chest Stretch 3 reps;30 seconds;Right;Left    Piriformis Stretch 2 reps;30 seconds    Piriformis Stretch Limitations seated    Other Lumbar Stretch Exercise childs pose 30 sec x 3 , L, R, Center.      Lumbar Exercises: Aerobic   Recumbent Bike L 2 x 8 min;      Lumbar Exercises: Standing   Functional Squats 20 reps    Other Standing Lumbar Exercises Paloff Press Blue TB x 15 bil;    Other Standing Lumbar Exercises --      Lumbar Exercises: Seated   Sit to Stand 15 reps   10 lb     Lumbar Exercises: Supine   Bent Knee Raise 20 reps    Bridge 20 reps      Manual Therapy   Manual Therapy Manual Traction    Soft tissue mobilization STM , TPR to L lumbar, lateral at incision, and at lower rib attachment.    Manual Traction long leg distraction on L forlumbar pump x 2 min;                     PT Education - 10/11/21 1127     Education Details Final HEP reviewed, Discussed f/u with MD for further assessment or treatment.    Person(s) Educated Patient    Methods Explanation;Demonstration;Tactile cues;Verbal cues;Handout    Comprehension Verbalized understanding;Returned demonstration;Verbal cues required;Tactile cues required              PT Short Term Goals - 10/02/21 2050       PT SHORT TERM GOAL #1   Title Pt to be independent with initial HEP    Time 2    Period Weeks    Status Achieved    Target Date 09/08/21               PT Long Term Goals - 10/11/21 1359       PT LONG TERM GOAL #1   Title Pt to be independent with final HEP    Time 6    Period  Weeks    Status Achieved      PT LONG TERM GOAL #2   Title Pt to report decreased pain in back to 0-2/10 with lumbar flexion.    Time 6    Period Weeks    Status Partially Met      PT LONG TERM GOAL #3   Title Pt to demo ability for lumbar flexion ROM , to at  least 60 deg, to improve ability for bending, dressing, and IADLS.    Time 6    Period Weeks    Status Achieved      PT LONG TERM GOAL #4   Title Pt to report ability to sit upright in chair, for meals and relaxing/rest, for at least 15-30 min. without pain more than 2/10    Time 6    Period Weeks    Status Partially Met                   Plan - 10/11/21 1400     Clinical Impression Statement Pt has shown some improvment in pain. He is doing more (lumbar)flexion that he was previously and is tolerant of regular daily activities. He is still having increased pain with increased time spent in flexion, for housework and outdoor activities. He is able to perform lumbar flexion and squat motions for IADLS easier than he was previously (he was avoiding any type of bending).  He has been encourged to try to sit upright in chair for meals, he is still not able to sit and eat with family due to pain and/or fear of pain. He does have tenderness to palpate area just above incision at lateral torso that has been unchanged. Pt doing well with HEP at this time. He is still quite frustrated with not knowing the source/cause of the pain, and that he is not able to be at his prior level of function and activity level/playing golf.  Recommend that he continue HEP and f/u with MD for further assessment of cause of pain,  options for finding pain source, and options for treatment. Pt in agreement with plan. He will schedule f/u with MD.    Personal Factors and Comorbidities Comorbidity 1    Comorbidities hernia repair march 2022    Examination-Activity Limitations Bathing;Lift;Bed Mobility;Toileting;Bend;Transfers;Self Feeding;Carry;Sit;Squat;Dressing;Hygiene/Grooming    Examination-Participation Restrictions Cleaning;Meal Prep;Yard Work;Community Activity;Driving;Shop    Stability/Clinical Decision Making Evolving/Moderate complexity    Rehab Potential Fair    PT Frequency 2x / week    PT Duration 6  weeks    PT Treatment/Interventions ADLs/Self Care Home Management;Cryotherapy;Moist Heat;Traction;DME Instruction;Balance training;Therapeutic exercise;Therapeutic activities;Functional mobility training;Stair training;Gait training;Neuromuscular re-education;Patient/family education;Manual techniques;Passive range of motion;Dry needling;Spinal Manipulations;Joint Manipulations;Scar mobilization    Consulted and Agree with Plan of Care Patient             Patient will benefit from skilled therapeutic intervention in order to improve the following deficits and impairments:  Pain, Decreased mobility, Increased muscle spasms, Decreased range of motion, Decreased activity tolerance, Impaired flexibility  Visit Diagnosis: Acute left-sided low back pain without sciatica     Problem List Patient Active Problem List   Diagnosis Date Noted   Chronic left flank pain 06/29/2021   Constipation, chronic 02/18/2021   Acute urinary retention 02/18/2021   Diverticular disease of colon 02/11/2021   Personal history of colonic polyps 02/11/2021   Colon cancer screening 02/11/2021   Recurrent left flank incisional hernia s/p lap repair w mesh 02/11/2021 02/11/2021   Postcalcaneal bursitis of left  foot 04/09/2020   Disorder of left eustachian tube 08/14/2019   Vertigo 12/19/2018   Partial degenerative rupture of right biceps tendon 07/18/2017   Right lateral epicondylitis 07/18/2017   Hyperglycemia 04/18/2017   High foot arch 03/29/2016   Arthritis, midfoot 03/29/2016   Tibialis posterior tendinitis 03/29/2016   Left foot pain 03/28/2016   Mitral regurgitation 09/05/2013   Carotid artery disease without cerebral infarction [facility])    Hx of CABG    Ejection fraction    Preventative health care 08/30/2012   Atrial fibrillation ([place])    Thrombocytopenia ([place]) 07/26/2009   Headache(784.0) 07/15/2009   Hyperlipidemia 02/09/2009   Anxiety state 02/09/2009   Depression 02/09/2009   BENIGN  PROSTATIC HYPERTROPHY 02/09/2009   PSA, INCREASED 02/09/2009   NEPHROLITHIASIS, HX OF 02/09/2009   CAD (coronary artery disease) 07/11/2008   Lyndee Hensen, PT, DPT 2:08 PM  10/11/21    [facility] [place], [place], [address] Phone: [phone]   Fax:  [phone]  Name: [name] MRN: [id number] Date of Birth: 14-Nov-1941   PHYSICAL THERAPY DISCHARGE SUMMARY  Visits from Start of Care: 7   Plan: Patient agrees to discharge.  Patient goals were partially met. Patient is being discharged due to no further progress. Will refer back to MD.    Lyndee Hensen, PT, DPT 2:09 PM  10/11/21

## 2021-10-12 ENCOUNTER — Encounter: Payer: Self-pay | Admitting: Family Medicine

## 2021-10-12 ENCOUNTER — Ambulatory Visit (INDEPENDENT_AMBULATORY_CARE_PROVIDER_SITE_OTHER): Payer: Medicare Other | Admitting: Family Medicine

## 2021-10-12 VITALS — BP 130/72 | HR 57 | Ht 64.0 in | Wt 136.4 lb

## 2021-10-12 DIAGNOSIS — I2510 Atherosclerotic heart disease of native coronary artery without angina pectoris: Secondary | ICD-10-CM | POA: Diagnosis not present

## 2021-10-12 DIAGNOSIS — R109 Unspecified abdominal pain: Secondary | ICD-10-CM

## 2021-10-12 NOTE — Patient Instructions (Addendum)
Good to see you today.  Follow-up:as needed.   Follow up with Dr Ernestina Patches and General surgery.   Get a CD made of the MRI  Please call [facility] at [phone] to get that CD made.

## 2021-10-18 ENCOUNTER — Encounter: Payer: Self-pay | Admitting: Physical Medicine and Rehabilitation

## 2021-10-18 ENCOUNTER — Ambulatory Visit (INDEPENDENT_AMBULATORY_CARE_PROVIDER_SITE_OTHER): Payer: Medicare Other | Admitting: Physical Medicine and Rehabilitation

## 2021-10-18 ENCOUNTER — Other Ambulatory Visit: Payer: Self-pay

## 2021-10-18 VITALS — BP 132/87 | HR 87

## 2021-10-18 DIAGNOSIS — Z8719 Personal history of other diseases of the digestive system: Secondary | ICD-10-CM

## 2021-10-18 DIAGNOSIS — Z9889 Other specified postprocedural states: Secondary | ICD-10-CM

## 2021-10-18 DIAGNOSIS — L7682 Other postprocedural complications of skin and subcutaneous tissue: Secondary | ICD-10-CM | POA: Diagnosis not present

## 2021-10-18 DIAGNOSIS — G588 Other specified mononeuropathies: Secondary | ICD-10-CM | POA: Diagnosis not present

## 2021-10-18 DIAGNOSIS — G894 Chronic pain syndrome: Secondary | ICD-10-CM | POA: Diagnosis not present

## 2021-10-18 DIAGNOSIS — G8929 Other chronic pain: Secondary | ICD-10-CM

## 2021-10-18 DIAGNOSIS — R109 Unspecified abdominal pain: Secondary | ICD-10-CM

## 2021-10-18 DIAGNOSIS — I2510 Atherosclerotic heart disease of native coronary artery without angina pectoris: Secondary | ICD-10-CM

## 2021-10-18 NOTE — Progress Notes (Signed)
Pt state flank pain. Pt state sitting and bending makes the pain worse. Pt state he uses heating and laying down to help ease his pain.  Numeric Pain Rating Scale and Functional Assessment Average Pain 10 Pain Right Now 2 My pain is intermittent and aching Pain is worse with: sitting and standing Pain improves with: heat/ice   In the last MONTH (on 0-10 scale) has pain interfered with the following?  1. General activity like being  able to carry out your everyday physical activities such as walking, climbing stairs, carrying groceries, or moving a chair?  Rating(6)  2. Relation with others like being able to carry out your usual social activities and roles such as  activities at home, at work and in your community. Rating(7)  3. Enjoyment of life such that you have  been bothered by emotional problems such as feeling anxious, depressed or irritable?  Rating(8)

## 2021-10-18 NOTE — Progress Notes (Signed)
[name] - 80 y.o. male MRN [id number]  Date of birth: [birth year]  Office Visit Note: Visit Date: 10/18/2021 PCP: Biagio Borg, MD Referred by: Biagio Borg, MD  Subjective: No chief complaint on file.  HPI: [name] is a 80 y.o. male who comes in today  at the request of Dr. [name] for evaluation of chronic, worsening and severe left sided flank incisional pain. Patient had surgery to remove kidney stones many years ago which required open flank incision. Patient developed incisional site hernia after procedure and was initially repaired by Dr. Armandina Gemma about 20 years ago. Patient began experiencing swelling and pain to left flank incision site several years ago. He then underwent laparoscopic repair of incisional recurrent left flank hernia with mesh tap block by Dr. Michael Boston on 02/11/2021, which did provide some pain relief, however pain relief was short lived. Patient states the pain he is experiencing is directly over incision site and describes as sharp in nature, currently rates pain as 7 out of 10. Patient reports pain is exacerbated by bending and twisting. Patient is currently attending formal physical therapy at [place] and reports these treatments do not help to alleviate pain. Patient reports some relief of pain with rest and topical pain creams. Patient states he is growing frustrated because he feels like this is so difficult to figure out and he just wants to feel better. Patient states he is normally a very active person and played golf multiple times a week before he underwent hernia repair in March. Patient states he feels like he can't do anything at this point and has become very sedentary.  He is actually happy that he has no pain usually with getting comfortable or at night and he can sleep etc. and he can walk but he just cannot do anything real active.  Patient was recently seen and evaluated by Dr. [name] whom placed a  referral for surgical consultation at [facility] with Dr. Judeth Cornfield for a second opinion regarding the chronic incisional hernia issues. Patient is scheduled for this appointment on 10/26/2021. Overall, patient states he can walk without difficulty and does not want to try procedure or treatment that could potentially make his pain worse. Patient denies focal weakness, numbness and tingling. Patient denies recent trauma or falls.   Review of Systems  Musculoskeletal:  Positive for back pain.       Pt reports pain to left flank area directly over surgical incision.   Neurological:  Negative for tingling, sensory change, focal weakness and weakness.  All other systems reviewed and are negative. Otherwise per HPI.  Assessment & Plan: Visit Diagnoses:    ICD-10-CM   1. Chronic left flank pain  R10.9    G89.29     2. Intercostal neuritis  G58.8     3. Incisional pain  L76.82     4. H/O hernia repair  Z98.890    Z87.19     5. Chronic pain syndrome  G89.4        Plan: Findings:  Chronic, worsening and severe left sided flank incisional pain. Patient continues to have pain despite good conservative therapies such as formal physical therapy and rest. Patient's clinical presentation and exam seem to be complex. His symptoms could be consistent with multiple differentials such as intercostal neuritis as his pain is in close proximity of the 12 th rib, secondary pain related to multiple incisional hernia repairs or  en growth of nerves within the mesh used for hernia repair.  He has not used Voltaren gel or lidocaine patches over this incisional area and we did give him some details on this today to try over the next couple of weeks.  We feel the next step is to perform a diagnostic and hopefully therapeutic left sided intercostal nerve block under fluoroscopic guidance. We did speak with patient in great detail today regarding this procedure and we informed him that this could help, however  we can't guarantee this will eliminate his pain. Patient voices concern that he does not want to move forward with intercostal nerve block at this time because he is concerned about this procedure making his symptoms worse. We did speak with patient about other treatment options such as referral to [facility] for more advanced interventional treatment such as peripheral nerve stimulation or perhaps ablation.  This would depend on his second opinion with the surgeon. Patient informed to keep upcoming appointment with Dr. Judeth Cornfield at [facility]. We are happy to see patient back if he changes his mind about performing intercostal nerve block. Patient instructed to follow-up with [place] as needed. No red flag symptoms noted upon exam today.    Meds & Orders: No orders of the defined types were placed in this encounter.  No orders of the defined types were placed in this encounter.   Follow-up: Return if symptoms worsen or fail to improve.   Procedures: No procedures performed      Clinical History: No specialty comments available.   He reports that he has never smoked. He has never used smokeless tobacco.  Recent Labs    06/29/21 1019  HGBA1C 6.3    Objective:  VS:  HT:    WT:   BMI:     BP:132/87  HR:87bpm  TEMP: ( )  RESP:  Physical Exam Vitals and nursing note reviewed.  HENT:     Head: Normocephalic and atraumatic.     Left Ear: External ear normal.     Nose: Nose normal.     Mouth/Throat:     Mouth: Mucous membranes are dry.  Eyes:     Extraocular Movements: Extraocular movements intact.  Cardiovascular:     Rate and Rhythm: Normal rate.     Pulses: Normal pulses.  Pulmonary:     Effort: Pulmonary effort is normal.  Abdominal:     Hernia: A hernia is present.     Comments: Left sided abdominal hernia noted. Incisional scar noted to left flank area.   Musculoskeletal:        General: Tenderness present.     Cervical back: Normal range of motion.      Comments: Pt is slow to rise from seated position to standing. Strong distal strength without clonus, no pain upon palpation of greater trochanters. Sensation intact bilaterally. Walks independently, gait steady.   Tenderness noted upon palpation of left abdominal area around to left paraspinal region. Pain noted upon palpation of left flank incisional scar. Pain with lumbar rotation and extension.  The pain does tend to run along the 12th rib and this can be confirmed looking at the right side as well.  Skin:    General: Skin is warm and dry.     Capillary Refill: Capillary refill takes less than 2 seconds.  Neurological:     General: No focal deficit present.     Mental Status: He is alert.     Sensory: No sensory  deficit.     Motor: No weakness.     Gait: Gait normal.  Psychiatric:        Mood and Affect: Mood normal.    Ortho Exam  Imaging: No results found.  Past Medical/Family/Surgical/Social History: Medications & Allergies reviewed per EMR, new medications updated. Patient Active Problem List   Diagnosis Date Noted   Chronic left flank pain 06/29/2021   Constipation, chronic 02/18/2021   Acute urinary retention 02/18/2021   Diverticular disease of colon 02/11/2021   Personal history of colonic polyps 02/11/2021   Colon cancer screening 02/11/2021   Recurrent left flank incisional hernia s/p lap repair w mesh 02/11/2021 02/11/2021   Postcalcaneal bursitis of left foot 04/09/2020   Disorder of left eustachian tube 08/14/2019   Vertigo 12/19/2018   Partial degenerative rupture of right biceps tendon 07/18/2017   Right lateral epicondylitis 07/18/2017   Hyperglycemia 04/18/2017   High foot arch 03/29/2016   Arthritis, midfoot 03/29/2016   Tibialis posterior tendinitis 03/29/2016   Left foot pain 03/28/2016   Mitral regurgitation 09/05/2013   Carotid artery disease without cerebral infarction [facility])    Hx of CABG    Ejection fraction    Preventative health care  08/30/2012   Atrial fibrillation ([place])    Thrombocytopenia ([place]) 07/26/2009   Headache(784.0) 07/15/2009   Hyperlipidemia 02/09/2009   Anxiety state 02/09/2009   Depression 02/09/2009   BENIGN PROSTATIC HYPERTROPHY 02/09/2009   PSA, INCREASED 02/09/2009   NEPHROLITHIASIS, HX OF 02/09/2009   CAD (coronary artery disease) 07/11/2008   Past Medical History:  Diagnosis Date   Arthritis    Atrial fibrillation ([place])    post MI-amiodarone stopped 07/2008   Benign prostatic hypertrophy    CAD (coronary artery disease) 07/2008   2 BMS placed for MI, August, 2009   Carotid artery disease without cerebral infarction [facility])    CORONARY ARTERY BYPASS GRAFT, HX OF 07/13/2009   August, 2009, Dr. Cyndia Bent   Ejection fraction    .   History of kidney stones    History of nephrolithiasis    Hx of CABG    Hx of colonoscopy    Hyperlipidemia    HYPERLIPIDEMIA 02/09/2009   Qualifier: Diagnosis of  By: Jenny Reichmann MD, Hunt Oris    Increased prostate specific antigen (PSA) velocity    Myocardial infarction ([place])    NEPHROLITHIASIS, HX OF 02/09/2009   Qualifier: Diagnosis of  By: Jenny Reichmann MD, Hunt Oris    Prostatitis    PSA elevation 09/06/2012   Thrombocytopenia ([place])    Family History  Problem Relation Age of Onset   Lung cancer Mother    Leukemia Father    Healthy Brother    Colon cancer Neg Hx    Rectal cancer Neg Hx    Stomach cancer Neg Hx    Past Surgical History:  Procedure Laterality Date   CORONARY ARTERY BYPASS GRAFT  08/05/08   x5, LIMA to the LAD.    CORONARY STENT PLACEMENT  07/2008   2 BMS to RCA   [place]  04/16/2006   Left flank incisional hernia repair with mesh.  Dr Lonzo Cloud HERNIA REPAIR N/A 02/11/2021   Procedure: LAPAROSCOPIC REPAIR OF  INCISIONAL RECURRENT LEFT FLANK HERNIA WITH MESH TAP BLOCK - BILATERAL;  Surgeon: Michael Boston, MD;  Location: [facility];  Service: General;  Laterality: N/A;   left leg broken      PROSTATE  BIOPSY  2007   neg   ROTATOR CUFF REPAIR     bilateral; Dr. Noemi Chapel   Social History   Occupational History   Not on file  Tobacco Use   Smoking status: Never   Smokeless tobacco: Never  Vaping Use   Vaping Use: Never used  Substance and Sexual Activity   Alcohol use: No   Drug use: No   Sexual activity: Not Currently

## 2021-10-26 DIAGNOSIS — K458 Other specified abdominal hernia without obstruction or gangrene: Secondary | ICD-10-CM | POA: Diagnosis not present

## 2021-10-26 DIAGNOSIS — R1032 Left lower quadrant pain: Secondary | ICD-10-CM | POA: Diagnosis not present

## 2021-10-26 DIAGNOSIS — M6258 Muscle wasting and atrophy, not elsewhere classified, other site: Secondary | ICD-10-CM | POA: Diagnosis not present

## 2021-11-07 DIAGNOSIS — M795 Residual foreign body in soft tissue: Secondary | ICD-10-CM | POA: Insufficient documentation

## 2021-11-14 ENCOUNTER — Other Ambulatory Visit: Payer: Self-pay | Admitting: Internal Medicine

## 2021-11-14 ENCOUNTER — Encounter: Payer: Self-pay | Admitting: Dermatology

## 2021-11-14 ENCOUNTER — Ambulatory Visit (INDEPENDENT_AMBULATORY_CARE_PROVIDER_SITE_OTHER): Payer: Medicare Other | Admitting: Dermatology

## 2021-11-14 ENCOUNTER — Other Ambulatory Visit: Payer: Self-pay

## 2021-11-14 DIAGNOSIS — I2510 Atherosclerotic heart disease of native coronary artery without angina pectoris: Secondary | ICD-10-CM

## 2021-11-14 DIAGNOSIS — L219 Seborrheic dermatitis, unspecified: Secondary | ICD-10-CM

## 2021-11-14 DIAGNOSIS — L570 Actinic keratosis: Secondary | ICD-10-CM | POA: Diagnosis not present

## 2021-11-14 DIAGNOSIS — M795 Residual foreign body in soft tissue: Secondary | ICD-10-CM | POA: Diagnosis not present

## 2021-11-14 DIAGNOSIS — Z01818 Encounter for other preprocedural examination: Secondary | ICD-10-CM | POA: Diagnosis not present

## 2021-11-14 MED ORDER — CLOBETASOL PROPIONATE 0.05 % EX FOAM: Freq: Two times a day (BID) | CUTANEOUS | 5 refills | Status: DC

## 2021-11-14 MED ORDER — TOLAK 4 % EX CREA: TOPICAL_CREAM | CUTANEOUS | 0 refills | Status: DC

## 2021-11-14 NOTE — Patient Instructions (Signed)
If you do not hear from [facility] by the end of the day today then give them a call tomorrow @ [phone].   Get OTC 1% Nizoral Shampoo.

## 2021-11-14 NOTE — Telephone Encounter (Signed)
Please refill as per office routine med refill policy (all routine meds to be refilled for 3 mo or monthly (per pt preference) up to one year from last visit, then month to month grace period for 3 mo, then further med refills will have to be denied) ? ?

## 2021-11-17 DIAGNOSIS — M795 Residual foreign body in soft tissue: Secondary | ICD-10-CM | POA: Diagnosis not present

## 2021-11-17 DIAGNOSIS — L923 Foreign body granuloma of the skin and subcutaneous tissue: Secondary | ICD-10-CM | POA: Diagnosis not present

## 2021-11-17 DIAGNOSIS — T8189XA Other complications of procedures, not elsewhere classified, initial encounter: Secondary | ICD-10-CM | POA: Diagnosis not present

## 2021-11-17 DIAGNOSIS — D216 Benign neoplasm of connective and other soft tissue of trunk, unspecified: Secondary | ICD-10-CM | POA: Diagnosis not present

## 2021-12-04 ENCOUNTER — Encounter: Payer: Self-pay | Admitting: Dermatology

## 2021-12-04 NOTE — Progress Notes (Signed)
° °  Follow-Up Visit   Subjective  [name] is a 80 y.o. male who presents for the following: Follow-up (4 month f/u- left cheek x 2 & right forehead- still scaly).  Scaly spots head and neck Location:  Duration:  Quality:  Associated Signs/Symptoms: Modifying Factors:  Severity:  Timing: Context:   Objective  Well appearing patient in no apparent distress; mood and affect are within normal limits. Left Buccal Cheek, Right Buccal Cheek Gritty pink 5 mm crust  Left Anterior Mandible, Right Anterior Mandible, Scalp Scale around ears, scalp benefits inflammation and UV damage.    All skin waist up examined.   Assessment & Plan    AK (actinic keratosis) (2) Left Buccal Cheek; Right Buccal Cheek  Patient has diffuse UV damage so we will initially use topical fluorouracil and recheck after this is completed.  Goal will be a total of 28 total applications but if he gets too irritated he will contact me to modify the regimen.  Fluorouracil (TOLAK) 4 % CREA - Left Buccal Cheek, Right Buccal Cheek Apply to affected area nightly x 28 applications.  Seborrheic dermatitis Left Anterior Mandible; Right Anterior Mandible; Scalp  Clobetasol foam daily for maximum 4 weeks, avoid use on face  clobetasol (OLUX) 0.05 % topical foam - Left Anterior Mandible, Right Anterior Mandible, Scalp Apply topically 2 (two) times daily.      I, Lavonna Monarch, MD, have reviewed all documentation for this visit.  The documentation on 12/04/21 for the exam, diagnosis, procedures, and orders are all accurate and complete.

## 2021-12-30 ENCOUNTER — Ambulatory Visit (INDEPENDENT_AMBULATORY_CARE_PROVIDER_SITE_OTHER): Payer: Medicare Other | Admitting: Internal Medicine

## 2021-12-30 ENCOUNTER — Other Ambulatory Visit: Payer: Self-pay

## 2021-12-30 ENCOUNTER — Encounter: Payer: Self-pay | Admitting: Internal Medicine

## 2021-12-30 VITALS — BP 120/72 | HR 63 | Temp 98.7°F | Ht 64.0 in | Wt 135.0 lb

## 2021-12-30 DIAGNOSIS — D696 Thrombocytopenia, unspecified: Secondary | ICD-10-CM

## 2021-12-30 DIAGNOSIS — R739 Hyperglycemia, unspecified: Secondary | ICD-10-CM | POA: Diagnosis not present

## 2021-12-30 DIAGNOSIS — G8929 Other chronic pain: Secondary | ICD-10-CM

## 2021-12-30 DIAGNOSIS — E7800 Pure hypercholesterolemia, unspecified: Secondary | ICD-10-CM

## 2021-12-30 DIAGNOSIS — R109 Unspecified abdominal pain: Secondary | ICD-10-CM | POA: Diagnosis not present

## 2021-12-30 NOTE — Progress Notes (Signed)
Patient ID: [name], male   DOB: [birth year], 81 y.o.   MRN: [id number]         Chief Complaint:: yearly exam       HPI:  [name] is a 81 y.o. male   S/p mar 2022 abd wall hernia surgury but still slowed in recovery, not playing golf as before but is walking 2.5 miles per day  Pt left flank pian resolved after stitch and partial mesh removed.  Only taking 1/2 statin as he believes this can make his chronic low mild platelet count worse   Pt denies chest pain, increased sob or doe, wheezing, orthopnea, PND, increased LE swelling, palpitations, dizziness or syncope.   Pt denies polydipsia, polyuria, or new focal neuro s/s   Pt denies fever, wt loss, night sweats, loss of appetite, or other constitutional symptoms   Wt Readings from Last 3 Encounters:  12/30/21 135 lb (61.2 kg)  10/12/21 136 lb 6.4 oz (61.9 kg)  09/04/21 136 lb (61.7 kg)   BP Readings from Last 3 Encounters:  12/30/21 120/72  10/18/21 132/87  10/12/21 130/72   Immunization History  Administered Date(s) Administered   Fluad Quad(high Dose 65+) 08/14/2019   Influenza, High Dose Seasonal PF 10/04/2017, 08/30/2018   Influenza,inj,Quad PF,6+ Mos 09/12/2013, 01/14/2015   PFIZER(Purple Top)SARS-COV-2 Vaccination 02/08/2020, 03/09/2020   Pneumococcal Conjugate-13 09/12/2013   Pneumococcal Polysaccharide-23 08/31/2008   Td 02/09/2009   Zoster, Live 02/09/2009   There are no preventive care reminders to display for this patient.     Past Medical History:  Diagnosis Date   Arthritis    Atrial fibrillation ([place])    post MI-amiodarone stopped 07/2008   Benign prostatic hypertrophy    CAD (coronary artery disease) 07/2008   2 BMS placed for MI, August, 2009   Carotid artery disease without cerebral infarction [facility])    CORONARY ARTERY BYPASS GRAFT, HX OF 07/13/2009   August, 2009, Dr. Cyndia Bent   Ejection fraction    .   History of kidney stones    History of nephrolithiasis    Hx of CABG    Hx of colonoscopy     Hyperlipidemia    HYPERLIPIDEMIA 02/09/2009   Qualifier: Diagnosis of  By: Jenny Reichmann MD, Hunt Oris    Increased prostate specific antigen (PSA) velocity    Myocardial infarction ([place])    NEPHROLITHIASIS, HX OF 02/09/2009   Qualifier: Diagnosis of  By: Jenny Reichmann MD, Hunt Oris    Prostatitis    PSA elevation 09/06/2012   Thrombocytopenia [facility])    Past Surgical History:  Procedure Laterality Date   CORONARY ARTERY BYPASS GRAFT  08/05/08   x5, LIMA to the LAD.    CORONARY STENT PLACEMENT  07/2008   2 BMS to RCA   [place]  04/16/2006   Left flank incisional hernia repair with mesh.  Dr Lonzo Cloud HERNIA REPAIR N/A 02/11/2021   Procedure: LAPAROSCOPIC REPAIR OF  INCISIONAL RECURRENT LEFT FLANK HERNIA WITH MESH TAP BLOCK - BILATERAL;  Surgeon: Michael Boston, MD;  Location: [facility];  Service: General;  Laterality: N/A;   left leg broken     PROSTATE BIOPSY  2007   neg   ROTATOR CUFF REPAIR     bilateral; Dr. Noemi Chapel    reports that he has never smoked. He has never used smokeless tobacco. He reports that he does not drink alcohol and does not use  drugs. family history includes Healthy in his brother; Leukemia in his father; Lung cancer in his mother. Allergies  Allergen Reactions   Niacin Itching    Leg aches and itching   Current Outpatient Medications on File Prior to Visit  Medication Sig Dispense Refill   acetaminophen (TYLENOL) 500 MG tablet Take 1,000 mg by mouth every 6 (six) hours as needed for mild pain.     aspirin 81 MG EC tablet Take 1 tablet (81 mg total) by mouth daily. Swallow whole. 30 tablet 12   Cholecalciferol (VITAMIN D) 50 MCG (2000 UT) CAPS Take 2,000 Units by mouth daily.     clobetasol (OLUX) 0.05 % topical foam Apply topically 2 (two) times daily. 50 g 5   Fluorouracil (TOLAK) 4 % CREA Apply to affected area nightly x 28 applications. 40 g 0   Multiple Vitamin (MULTIVITAMIN) tablet Take 1 tablet by mouth daily.      simvastatin (ZOCOR) 40 MG tablet TAKE 1/2 (ONE-HALF) TABLET BY MOUTH AT BEDTIME 45 tablet 0   No current facility-administered medications on file prior to visit.        ROS:  All others reviewed and negative.  Objective        PE:  BP 120/72 (BP Location: Right Arm, Patient Position: Sitting, Cuff Size: Normal)    Pulse 63    Temp 98.7 F (37.1 C) (Oral)    Ht 5\' 4"  (1.626 m)    Wt 135 lb (61.2 kg)    SpO2 99%    BMI 23.17 kg/m                 Constitutional: Pt appears in NAD               HENT: Head: NCAT.                Right Ear: External ear normal.                 Left Ear: External ear normal.                Eyes: . Pupils are equal, round, and reactive to light. Conjunctivae and EOM are normal               Nose: without d/c or deformity               Neck: Neck supple. Gross normal ROM               Cardiovascular: Normal rate and regular rhythm.                 Pulmonary/Chest: Effort normal and breath sounds without rales or wheezing.                Abd:  Soft, NT, ND, + BS, no organomegaly               Neurological: Pt is alert. At baseline orientation, motor grossly intact               Skin: Skin is warm. No rashes, no other new lesions, LE edema - none               Psychiatric: Pt behavior is normal without agitation   Micro: none  Cardiac tracings I have personally interpreted today:  none  Pertinent Radiological findings (summarize): none   Lab Results  Component Value Date   WBC 7.7 06/29/2021   HGB 15.0 06/29/2021   HCT 43.8 06/29/2021  PLT 132.0 (L) 06/29/2021   GLUCOSE 114 (H) 06/29/2021   CHOL 158 06/29/2021   TRIG 192.0 (H) 06/29/2021   HDL 43.40 06/29/2021   LDLCALC 76 06/29/2021   ALT 15 06/29/2021   AST 23 06/29/2021   NA 139 06/29/2021   K 4.7 06/29/2021   CL 102 06/29/2021   CREATININE 1.10 06/29/2021   BUN 17 06/29/2021   CO2 30 06/29/2021   TSH 3.51 06/29/2021   PSA 6.56 (H) 06/29/2021   HGBA1C 6.3 06/29/2021    Assessment/Plan:  [name] is a 81 y.o. White or Caucasian [1] male with  has a past medical history of Arthritis, Atrial fibrillation ([place]), Benign prostatic hypertrophy, CAD (coronary artery disease) (07/2008), Carotid artery disease without cerebral infarction [facility]), CORONARY ARTERY BYPASS GRAFT, HX OF (07/13/2009), Ejection fraction, History of kidney stones, History of nephrolithiasis, CABG, colonoscopy, Hyperlipidemia, HYPERLIPIDEMIA (02/09/2009), Increased prostate specific antigen (PSA) velocity, Myocardial infarction ([place]), NEPHROLITHIASIS, HX OF (02/09/2009), Prostatitis, PSA elevation (09/06/2012), and Thrombocytopenia ([place]).  Chronic left flank pain Resolved after extensive multiple provider evaluation, MRI then stitch and partial mesh removal  Hyperglycemia Lab Results  Component Value Date   HGBA1C 6.3 06/29/2021   Stable, pt to continue current medical treatment  - diet   Hyperlipidemia Lab Results  Component Value Date   LDLCALC 76 06/29/2021   Uncontrolled with goal ldl < 70 but, pt to continue current statin zocor 40 - 1/2 qd - will not take more than this due to wary of making platelet count worse (father with bone marrow disease); for lower chol diet   Thrombocytopenia (HCC) Lab Results  Component Value Date   WBC 7.7 06/29/2021   HGB 15.0 06/29/2021   HCT 43.8 06/29/2021   MCV 88.8 06/29/2021   PLT 132.0 (L) 06/29/2021  chronic stable, no bleeding, bruising, will continue to follow  Followup: Return in about 6 months (around 06/29/2022).  Cathlean Cower, MD 12/31/2021 8:43 AM [facility]

## 2021-12-30 NOTE — Patient Instructions (Signed)
Please continue all other medications as before, and refills have been done if requested.  Please have the pharmacy call with any other refills you may need.  Please continue your efforts at being more active, low cholesterol diet, and weight control.  You are otherwise up to date with prevention measures today.  Please keep your appointments with your specialists as you may have planned  Please make an Appointment to return in 6 months, or sooner if needed,

## 2021-12-31 ENCOUNTER — Encounter: Payer: Self-pay | Admitting: Internal Medicine

## 2021-12-31 NOTE — Assessment & Plan Note (Signed)
Lab Results  Component Value Date   HGBA1C 6.3 06/29/2021   Stable, pt to continue current medical treatment  - diet

## 2021-12-31 NOTE — Assessment & Plan Note (Signed)
Lab Results  Component Value Date   WBC 7.7 06/29/2021   HGB 15.0 06/29/2021   HCT 43.8 06/29/2021   MCV 88.8 06/29/2021   PLT 132.0 (L) 06/29/2021  chronic stable, no bleeding, bruising, will continue to follow

## 2021-12-31 NOTE — Assessment & Plan Note (Signed)
Resolved after extensive multiple provider evaluation, MRI then stitch and partial mesh removal

## 2021-12-31 NOTE — Assessment & Plan Note (Signed)
Lab Results  Component Value Date   LDLCALC 76 06/29/2021   Uncontrolled with goal ldl < 70 but, pt to continue current statin zocor 40 - 1/2 qd - will not take more than this due to wary of making platelet count worse (father with bone marrow disease); for lower chol diet

## 2022-01-03 DIAGNOSIS — H2512 Age-related nuclear cataract, left eye: Secondary | ICD-10-CM | POA: Diagnosis not present

## 2022-01-03 DIAGNOSIS — Z961 Presence of intraocular lens: Secondary | ICD-10-CM | POA: Diagnosis not present

## 2022-01-03 DIAGNOSIS — H5203 Hypermetropia, bilateral: Secondary | ICD-10-CM | POA: Diagnosis not present

## 2022-02-09 DIAGNOSIS — H10412 Chronic giant papillary conjunctivitis, left eye: Secondary | ICD-10-CM | POA: Diagnosis not present

## 2022-02-09 DIAGNOSIS — H04123 Dry eye syndrome of bilateral lacrimal glands: Secondary | ICD-10-CM | POA: Diagnosis not present

## 2022-03-03 DIAGNOSIS — T679XXA Effect of heat and light, unspecified, initial encounter: Secondary | ICD-10-CM | POA: Diagnosis not present

## 2022-03-06 DIAGNOSIS — H0100B Unspecified blepharitis left eye, upper and lower eyelids: Secondary | ICD-10-CM | POA: Diagnosis not present

## 2022-03-06 DIAGNOSIS — H04123 Dry eye syndrome of bilateral lacrimal glands: Secondary | ICD-10-CM | POA: Diagnosis not present

## 2022-03-06 DIAGNOSIS — H1820 Unspecified corneal edema: Secondary | ICD-10-CM | POA: Diagnosis not present

## 2022-03-06 DIAGNOSIS — H0100A Unspecified blepharitis right eye, upper and lower eyelids: Secondary | ICD-10-CM | POA: Diagnosis not present

## 2022-05-15 ENCOUNTER — Ambulatory Visit (INDEPENDENT_AMBULATORY_CARE_PROVIDER_SITE_OTHER): Payer: Medicare Other | Admitting: Dermatology

## 2022-05-15 DIAGNOSIS — Z1283 Encounter for screening for malignant neoplasm of skin: Secondary | ICD-10-CM

## 2022-05-15 DIAGNOSIS — L821 Other seborrheic keratosis: Secondary | ICD-10-CM

## 2022-05-15 DIAGNOSIS — L570 Actinic keratosis: Secondary | ICD-10-CM | POA: Diagnosis not present

## 2022-05-15 DIAGNOSIS — D1801 Hemangioma of skin and subcutaneous tissue: Secondary | ICD-10-CM

## 2022-06-02 ENCOUNTER — Encounter: Payer: Self-pay | Admitting: Dermatology

## 2022-06-21 ENCOUNTER — Encounter: Payer: Self-pay | Admitting: Family Medicine

## 2022-06-21 ENCOUNTER — Ambulatory Visit (INDEPENDENT_AMBULATORY_CARE_PROVIDER_SITE_OTHER): Payer: Medicare Other | Admitting: Family Medicine

## 2022-06-21 ENCOUNTER — Ambulatory Visit
Admission: RE | Admit: 2022-06-21 | Discharge: 2022-06-21 | Disposition: A | Discharge status: Home / Self Care | Payer: Medicare Other | Source: Ambulatory Visit | Attending: Family Medicine | Admitting: Family Medicine

## 2022-06-21 VITALS — BP 136/84 | HR 61 | Temp 97.9°F | Ht 64.0 in | Wt 133.4 lb

## 2022-06-21 DIAGNOSIS — H938X1 Other specified disorders of right ear: Secondary | ICD-10-CM

## 2022-06-21 DIAGNOSIS — I2510 Atherosclerotic heart disease of native coronary artery without angina pectoris: Secondary | ICD-10-CM

## 2022-06-21 DIAGNOSIS — R519 Headache, unspecified: Secondary | ICD-10-CM

## 2022-06-21 DIAGNOSIS — H93A1 Pulsatile tinnitus, right ear: Secondary | ICD-10-CM | POA: Diagnosis not present

## 2022-06-21 DIAGNOSIS — R1111 Vomiting without nausea: Secondary | ICD-10-CM

## 2022-06-21 DIAGNOSIS — I6523 Occlusion and stenosis of bilateral carotid arteries: Secondary | ICD-10-CM | POA: Diagnosis not present

## 2022-06-21 DIAGNOSIS — M47812 Spondylosis without myelopathy or radiculopathy, cervical region: Secondary | ICD-10-CM | POA: Diagnosis not present

## 2022-06-21 DIAGNOSIS — I672 Cerebral atherosclerosis: Secondary | ICD-10-CM | POA: Diagnosis not present

## 2022-06-21 DIAGNOSIS — I6503 Occlusion and stenosis of bilateral vertebral arteries: Secondary | ICD-10-CM | POA: Diagnosis not present

## 2022-06-21 DIAGNOSIS — I700 Atherosclerosis of aorta: Secondary | ICD-10-CM | POA: Insufficient documentation

## 2022-06-21 HISTORY — DX: Atherosclerosis of aorta: I70.0

## 2022-06-21 LAB — COMPREHENSIVE METABOLIC PANEL
ALT: 14 U/L (ref 0–53)
AST: 28 U/L (ref 0–37)
Albumin: 4.5 g/dL (ref 3.5–5.2)
Alkaline Phosphatase: 56 U/L (ref 39–117)
BUN: 15 mg/dL (ref 6–23)
CO2: 31 mEq/L (ref 19–32)
Calcium: 9.9 mg/dL (ref 8.4–10.5)
Chloride: 99 mEq/L (ref 96–112)
Creatinine, Ser: 1.15 mg/dL (ref 0.40–1.50)
GFR: 60.18 mL/min (ref >60.00)
Glucose, Bld: 96 mg/dL (ref 70–99)
Potassium: 3.9 mEq/L (ref 3.5–5.1)
Sodium: 136 mEq/L (ref 135–145)
Total Bilirubin: 0.5 mg/dL (ref 0.2–1.2)
Total Protein: 7.8 g/dL (ref 6.0–8.3)

## 2022-06-21 LAB — CBC WITH DIFFERENTIAL/PLATELET
Basophils Absolute: 0 10*3/uL (ref 0.0–0.1)
Basophils Relative: 0.3 % (ref 0.0–3.0)
Eosinophils Absolute: 0.1 10*3/uL (ref 0.0–0.7)
Eosinophils Relative: 2.4 % (ref 0.0–5.0)
HCT: 44.9 % (ref 39.0–52.0)
Hemoglobin: 14.8 g/dL (ref 13.0–17.0)
Lymphocytes Relative: 38 % (ref 12.0–46.0)
Lymphs Abs: 2.2 10*3/uL (ref 0.7–4.0)
MCHC: 32.9 g/dL (ref 30.0–36.0)
MCV: 91.1 fl (ref 78.0–100.0)
Monocytes Absolute: 0.9 10*3/uL (ref 0.1–1.0)
Monocytes Relative: 15 % — ABNORMAL HIGH (ref 3.0–12.0)
Neutro Abs: 2.6 10*3/uL (ref 1.4–7.7)
Neutrophils Relative %: 44.3 % (ref 43.0–77.0)
Platelets: 128 10*3/uL — ABNORMAL LOW (ref 150.0–400.0)
RBC: 4.93 Mil/uL (ref 4.22–5.81)
RDW: 12.6 % (ref 11.5–15.5)
WBC: 5.8 10*3/uL (ref 4.0–10.5)

## 2022-06-21 MED ORDER — IOPAMIDOL (ISOVUE-370) INJECTION 76%
75.0000 mL | Freq: Once | INTRAVENOUS | Status: AC | PRN
  Administered 2022-06-21: 75 mL via INTRAVENOUS

## 2022-06-21 NOTE — Patient Instructions (Signed)
Go downstairs for labs.   Then, go to [place] at [facility]. [address] for a CT of your neck.

## 2022-06-21 NOTE — Progress Notes (Signed)
Subjective:  [name] is a 81 y.o. male who presents for right fullness and pulsatile tinnitus. Feels his heart beating in his ear.   States he felt overheated at the beach last week playing golf. Was more tired than usual. States he developed a bad headache. He went to the fire department at the beach and had his vital signs checked and they were normal.  States he then started vomiting 4 days ago and continued having a headache which has since resolved. He drank pedialyte. Only vomited 3 times and it stopped. Mild diarrhea.  Reports urine is normal appearing and not decreased or dark.   Denies fever, chest pain, palpitations, shortness of breath, urinary symptoms.   Denies sick contacts.  No other aggravating or relieving factors.  No other c/o.  ROS as in subjective. Past Medical History:  Diagnosis Date   Aortic atherosclerosis ([place]) 06/21/2022   Arthritis    Atrial fibrillation ([place])    post MI-amiodarone stopped 07/2008   Benign prostatic hypertrophy    CAD (coronary artery disease) 07/2008   2 BMS placed for MI, August, 2009   Carotid artery disease without cerebral infarction [facility])    CORONARY ARTERY BYPASS GRAFT, HX OF 07/13/2009   August, 2009, Dr. Cyndia Bent   Ejection fraction    .   History of kidney stones    History of nephrolithiasis    Hx of CABG    Hx of colonoscopy    Hyperlipidemia    HYPERLIPIDEMIA 02/09/2009   Qualifier: Diagnosis of  By: Jenny Reichmann MD, Hunt Oris    Increased prostate specific antigen (PSA) velocity    Myocardial infarction ([place])    NEPHROLITHIASIS, HX OF 02/09/2009   Qualifier: Diagnosis of  By: Jenny Reichmann MD, Hunt Oris    Prostatitis    PSA elevation 09/06/2012   Thrombocytopenia (HCC)       Objective: Vitals:   06/21/22 1308 06/21/22 1407  BP: (!) 142/86 136/84  Pulse: 61   Temp: 97.9 F (36.6 C)   SpO2: 95%     General appearance: Alert, WD/WN, no distress, mildly ill appearing                             Skin: warm, dry, no rash                            Head: no sinus tenderness                            Eyes: conjunctiva normal, corneas clear, PERRLA, EOMs intact                             Ears: normal appearing TMs, external ear and canals normal                          Nose: septum midline, no drainage             Mouth/throat: MMM, tongue normal, mild pharyngeal erythema                           Neck: supple, no adenopathy, no thyromegaly, nontender. No carotid bruit or JVD.  Heart: RRR, normal S1, S2, no murmurs                         Lungs: CTA bilaterally, no wheezes, rales, or rhonchi   Neuro: CN II-IX intact   Psych: normal speech, memory, behavior       Assessment: Pulsatile tinnitus of right ear - Plan: CT ANGIO NECK W OR WO CONTRAST  Sensation of fullness in right ear - Plan: CT ANGIO NECK W OR WO CONTRAST  Coronary artery disease involving native coronary artery of native heart without angina pectoris - Plan: CT ANGIO NECK W OR WO CONTRAST  Acute nonintractable headache, unspecified headache type - Plan: CBC with Differential/Platelet, Comprehensive metabolic panel, Comprehensive metabolic panel, CBC with Differential/Platelet  Vomiting without nausea, unspecified vomiting type - Plan: CBC with Differential/Platelet, Comprehensive metabolic panel, Comprehensive metabolic panel, CBC with Differential/Platelet    Plan: He does not appear to be in any distress. Benign exam. Overall symptoms improving but pulsatile tinnitus on right concerning for possible carotid dissection. CTA neck ordered stat and he will go to [facility] now.  Labs also ordered due to brief vomiting and diarrhea which has resolved.

## 2022-06-26 IMAGING — MR MR ABDOMEN W/O CM
5 series · 48 of 48 positions shown · non-contrast
Comparison: None.

CLINICAL DATA: Left anterior abdominal pain radiating to the back.

EXAM:
MRI ABDOMEN WITHOUT CONTRAST
TECHNIQUE: Multiplanar multisequence MR imaging was performed without the
administration of intravenous contrast.

[Series 5: STIR · coronal · 4.5mm · 1.72mm/px · 10 of 38 slices shown]
[im 1/38]
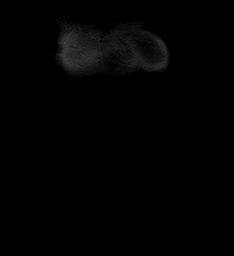
[im 5/38]
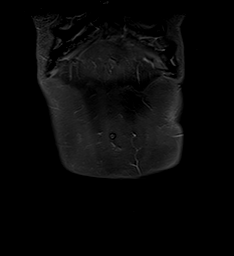
[im 9/38]
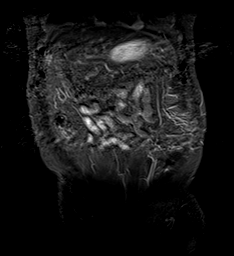
[im 13/38]
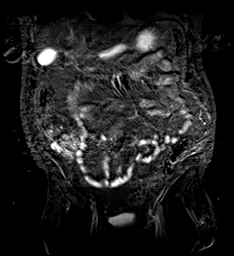
[im 17/38]
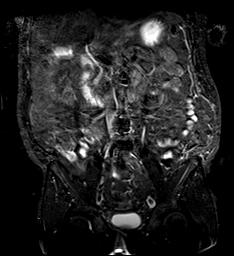
[im 21/38]
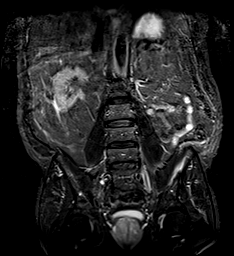
[im 25/38]
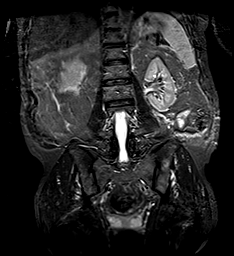
[im 29/38]
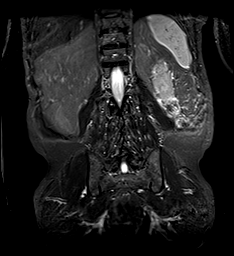
[im 33/38]
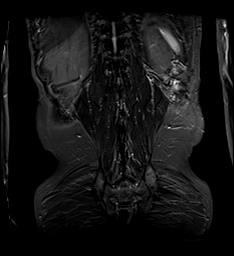
[im 38/38]
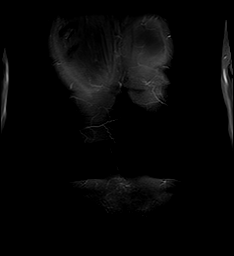

[Series 6: T1 · coronal · 4.5mm · 0.86mm/px · 8 of 36 slices shown]
[im 1/36]
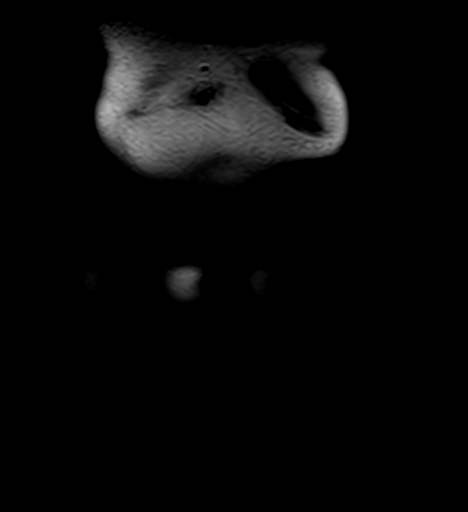
[im 6/36]
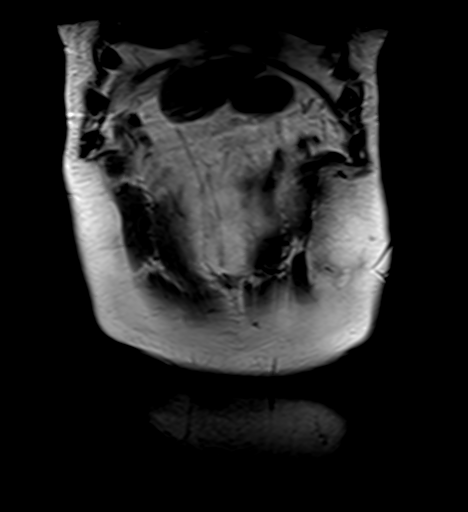
[im 11/36]
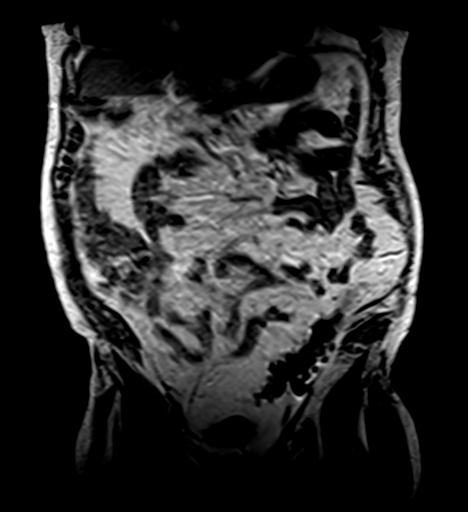
[im 16/36]
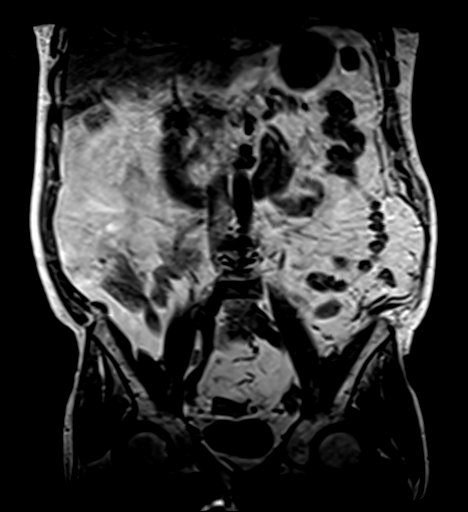
[im 21/36]
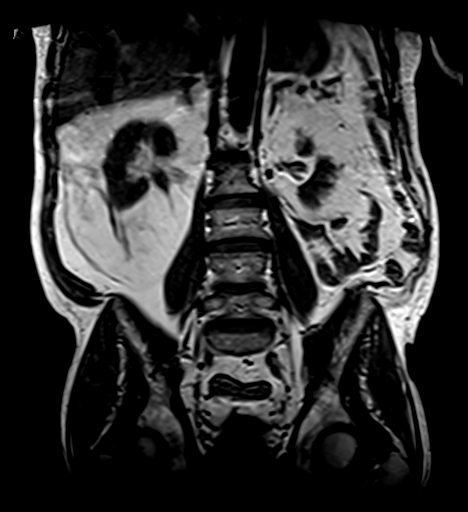
[im 26/36]
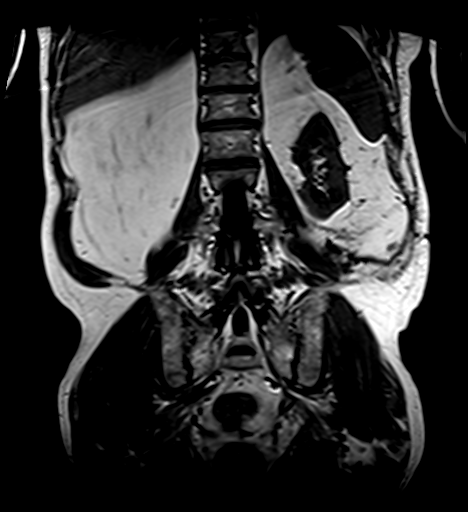
[im 31/36]
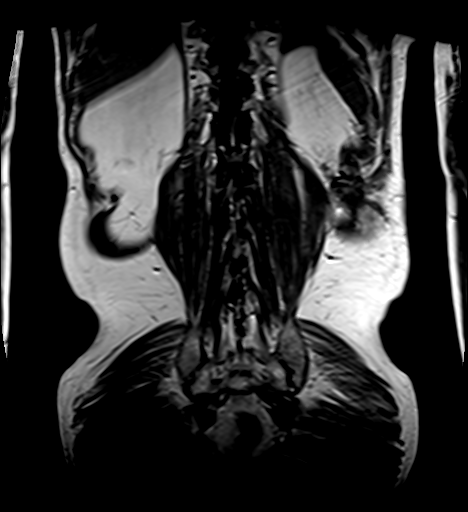
[im 36/36]
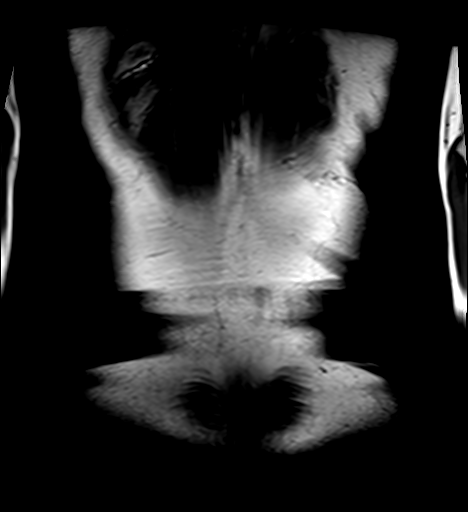

[Series 7: T2 · sagittal · 6.5mm · 1.17mm/px · 11 of 48 slices shown]
[im 1/48]
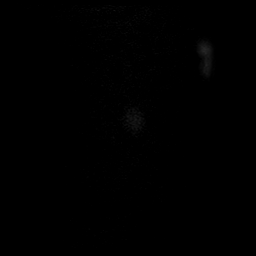
[im 5/48]
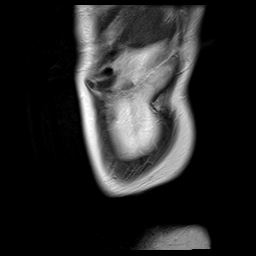
[im 10/48]
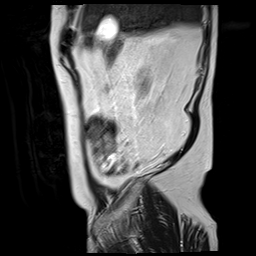
[im 15/48]
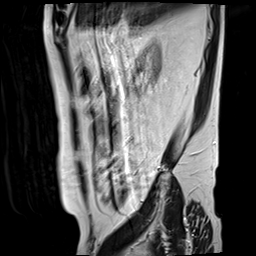
[im 19/48]
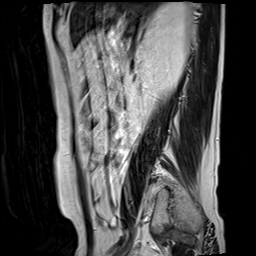
[im 24/48]
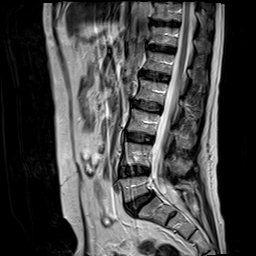
[im 29/48]
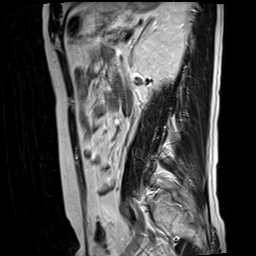
[im 33/48]
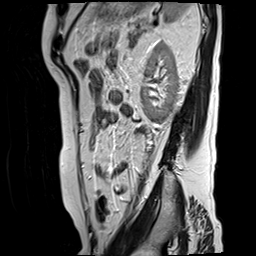
[im 38/48]
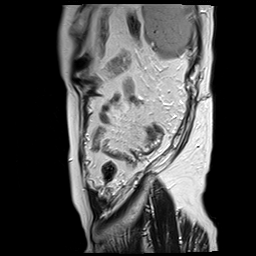
[im 43/48]
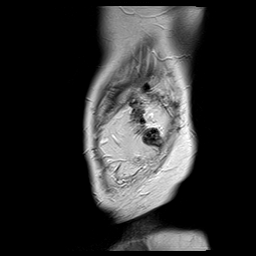
[im 48/48]
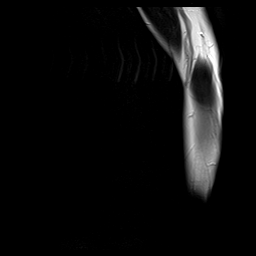

[Series 8: T2 fat-sat · coronal · 4.5mm · 1.72mm/px · 8 of 36 slices shown (1 of 2)]
[im 1/36]
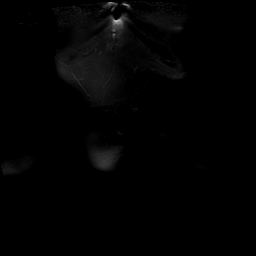
[im 6/36]
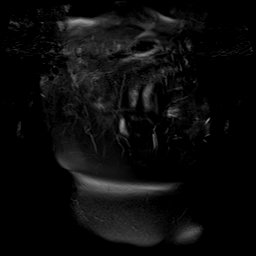
[im 11/36]
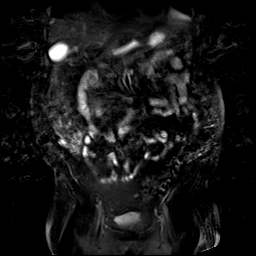
[im 16/36]
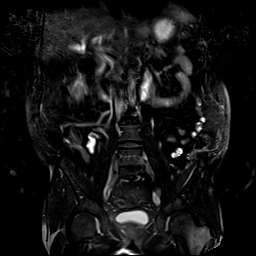
[im 21/36]
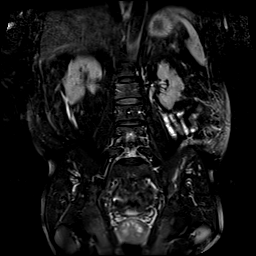
[im 26/36]
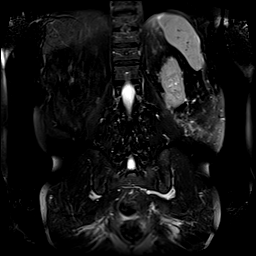
[im 31/36]
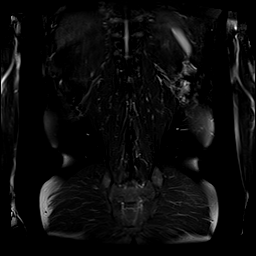
[im 36/36]
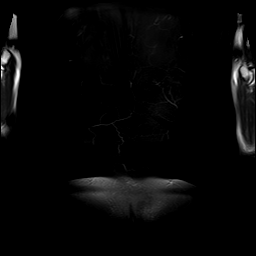

[Series 9: T2 fat-sat · axial · 4.5mm · 1.72mm/px · z∈[-122,+137]mm · 11 of 49 slices shown (2 of 2)]
[im 1/49]
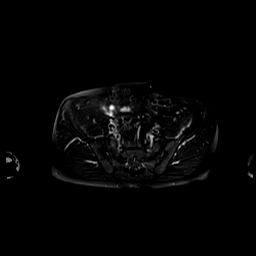
[im 5/49]
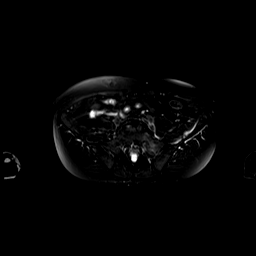
[im 10/49]
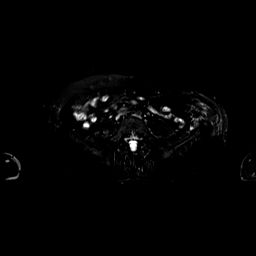
[im 15/49]
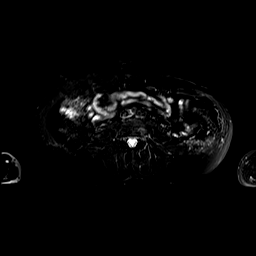
[im 20/49]
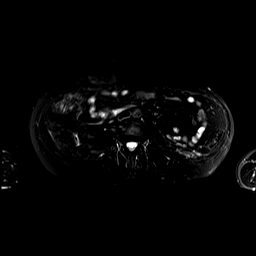
[im 25/49]
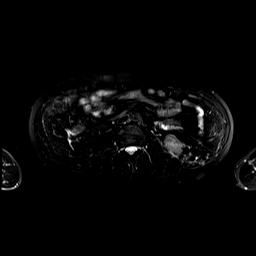
[im 29/49]
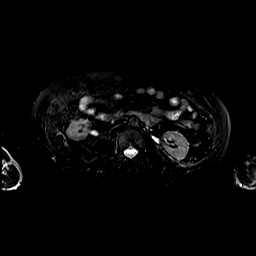
[im 34/49]
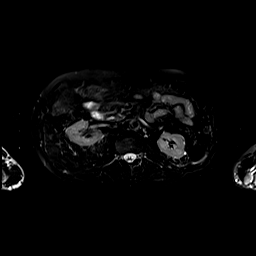
[im 39/49]
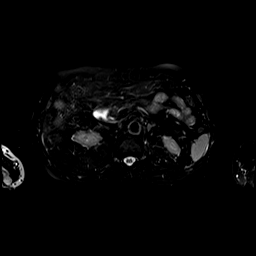
[im 44/49]
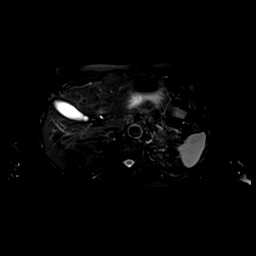
[im 49/49]
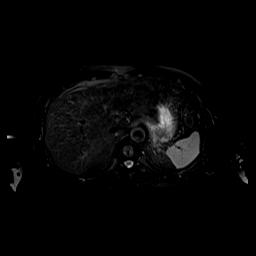

[48 of 48 positions shown; findings below may reference images not displayed]

FINDINGS: Bones/Joint/Cartilage

No fracture or dislocation. Normal alignment. No joint effusion. No
marrow signal abnormality. Bilateral facet arthropathy at L4-5 and
L5-S1.

Muscles and Tendons

Muscles are normal.

Soft tissue
No fluid collection or hematoma. No soft tissue mass. Left lateral
and posterior abdominal wall hernia repair with postsurgical changes
in the overlying subcutaneous fat. Tiny cystic area measuring 10 mm
likely reflecting a seroma. No recurrent abdominal wall hernia.

No focal abnormality of the liver, gallbladder, kidneys, pancreas or
spleen. No bowel dilatation. Sigmoid diverticulosis without evidence
of diverticulitis. No abdominal or pelvic free fluid. No bowel
dilatation.
IMPRESSION: 1. Left lateral and posterior abdominal wall hernia repair with
postsurgical changes in the overlying subcutaneous fat. Tiny cystic
area measuring 10 mm likely reflecting a seroma. No recurrent
abdominal wall hernia.

## 2022-06-29 ENCOUNTER — Ambulatory Visit (INDEPENDENT_AMBULATORY_CARE_PROVIDER_SITE_OTHER): Payer: Medicare Other | Admitting: Internal Medicine

## 2022-06-29 ENCOUNTER — Encounter: Payer: Self-pay | Admitting: Internal Medicine

## 2022-06-29 VITALS — BP 134/70 | HR 53 | Temp 97.7°F | Ht 64.0 in | Wt 134.6 lb

## 2022-06-29 DIAGNOSIS — R739 Hyperglycemia, unspecified: Secondary | ICD-10-CM | POA: Diagnosis not present

## 2022-06-29 DIAGNOSIS — I700 Atherosclerosis of aorta: Secondary | ICD-10-CM | POA: Diagnosis not present

## 2022-06-29 DIAGNOSIS — E559 Vitamin D deficiency, unspecified: Secondary | ICD-10-CM | POA: Diagnosis not present

## 2022-06-29 DIAGNOSIS — I2510 Atherosclerotic heart disease of native coronary artery without angina pectoris: Secondary | ICD-10-CM | POA: Diagnosis not present

## 2022-06-29 DIAGNOSIS — E7800 Pure hypercholesterolemia, unspecified: Secondary | ICD-10-CM | POA: Diagnosis not present

## 2022-06-29 DIAGNOSIS — D696 Thrombocytopenia, unspecified: Secondary | ICD-10-CM

## 2022-06-29 DIAGNOSIS — E538 Deficiency of other specified B group vitamins: Secondary | ICD-10-CM

## 2022-06-29 LAB — VITAMIN D 25 HYDROXY (VIT D DEFICIENCY, FRACTURES): VITD: 58.48 ng/mL (ref 30.00–100.00)

## 2022-06-29 LAB — HEMOGLOBIN A1C: Hgb A1c MFr Bld: 6.4 % (ref 4.6–6.5)

## 2022-06-29 LAB — LIPID PANEL
Cholesterol: 158 mg/dL (ref 0–200)
HDL: 41.6 mg/dL (ref >39.00)
NonHDL: 115.96
Total CHOL/HDL Ratio: 4
Triglycerides: 228 mg/dL — ABNORMAL HIGH (ref 0.0–149.0)
VLDL: 45.6 mg/dL — ABNORMAL HIGH (ref 0.0–40.0)

## 2022-06-29 LAB — VITAMIN B12: Vitamin B-12: 588 pg/mL (ref 211–911)

## 2022-06-29 LAB — LDL CHOLESTEROL, DIRECT: Direct LDL: 79 mg/dL

## 2022-06-29 LAB — TSH: TSH: 4.18 u[IU]/mL (ref 0.35–5.50)

## 2022-06-29 MED ORDER — SIMVASTATIN 40 MG PO TABS: ORAL_TABLET | ORAL | 3 refills | Status: DC

## 2022-06-29 NOTE — Progress Notes (Signed)
Patient ID: [name], male   DOB: Apr 16, 1941, 81 y.o.   MRN: [id number]        Chief Complaint: follow up HLD and hyperglycemia, aortic atherosclerosis, low platelet        HPI:  [name] is a 81 y.o. male here overall doing ok but admits to constant worry and anxiety, at least mild most days, worse sometimes.  Pt denies chest pain, increased sob or doe, wheezing, orthopnea, PND, increased LE swelling, palpitations, dizziness or syncope.   Pt denies polydipsia, polyuria, or new focal neuro s/s.   Tolerating zocor 20 mg.  No overt bleeding or bruising.  Pt is Worried about statin side effects and listens to doctor videos online that suggest getting off the statin, but has hx of CAD MI and aortic atherosclerosis, bilateral carotid plaque < 50%.   Wt Readings from Last 3 Encounters:  06/29/22 134 lb 9.6 oz (61.1 kg)  06/21/22 133 lb 6 oz (60.5 kg)  12/30/21 135 lb (61.2 kg)   BP Readings from Last 3 Encounters:  06/29/22 134/70  06/21/22 136/84  12/30/21 120/72         Past Medical History:  Diagnosis Date   Aortic atherosclerosis ([place]) 06/21/2022   Arthritis    Atrial fibrillation ([place])    post MI-amiodarone stopped 07/2008   Benign prostatic hypertrophy    CAD (coronary artery disease) 07/2008   2 BMS placed for MI, August, 2009   Carotid artery disease without cerebral infarction [facility])    CORONARY ARTERY BYPASS GRAFT, HX OF 07/13/2009   August, 2009, Dr. Cyndia Bent   Ejection fraction    .   History of kidney stones    History of nephrolithiasis    Hx of CABG    Hx of colonoscopy    Hyperlipidemia    HYPERLIPIDEMIA 02/09/2009   Qualifier: Diagnosis of  By: Jenny Reichmann MD, Hunt Oris    Increased prostate specific antigen (PSA) velocity    Myocardial infarction ([place])    NEPHROLITHIASIS, HX OF 02/09/2009   Qualifier: Diagnosis of  By: Jenny Reichmann MD, Hunt Oris    Prostatitis    PSA elevation 09/06/2012   Thrombocytopenia [facility])    Past Surgical History:  Procedure Laterality Date   CORONARY  ARTERY BYPASS GRAFT  08/05/08   x5, LIMA to the LAD.    CORONARY STENT PLACEMENT  07/2008   2 BMS to RCA   [place]  04/16/2006   Left flank incisional hernia repair with mesh.  Dr Lonzo Cloud HERNIA REPAIR N/A 02/11/2021   Procedure: LAPAROSCOPIC REPAIR OF  INCISIONAL RECURRENT LEFT FLANK HERNIA WITH MESH TAP BLOCK - BILATERAL;  Surgeon: Michael Boston, MD;  Location: [facility];  Service: General;  Laterality: N/A;   left leg broken     PROSTATE BIOPSY  2007   neg   ROTATOR CUFF REPAIR     bilateral; Dr. Noemi Chapel    reports that he has never smoked. He has never used smokeless tobacco. He reports that he does not drink alcohol and does not use drugs. family history includes Healthy in his brother; Leukemia in his father; Lung cancer in his mother. Allergies  Allergen Reactions   Niacin Itching    Leg aches and itching   Current Outpatient Medications on File Prior to Visit  Medication Sig Dispense Refill   acetaminophen (TYLENOL) 500 MG tablet Take 1,000 mg by mouth every  6 (six) hours as needed for mild pain.     aspirin 81 MG EC tablet Take 1 tablet (81 mg total) by mouth daily. Swallow whole. 30 tablet 12   Cholecalciferol (VITAMIN D) 50 MCG (2000 UT) CAPS Take 2,000 Units by mouth daily.     clobetasol (OLUX) 0.05 % topical foam Apply topically 2 (two) times daily. 50 g 5   Fluorouracil (TOLAK) 4 % CREA Apply to affected area nightly x 28 applications. 40 g 0   methocarbamol (ROBAXIN) 500 MG tablet      Multiple Vitamin (MULTIVITAMIN) tablet Take 1 tablet by mouth daily.     No current facility-administered medications on file prior to visit.        ROS:  All others reviewed and negative.  Objective        PE:  BP 134/70 (BP Location: Right Arm, Patient Position: Sitting, Cuff Size: Normal)   Pulse (!) 53   Temp 97.7 F (36.5 C) (Oral)   Ht '5\' 4"'$  (1.626 m)   Wt 134 lb 9.6 oz (61.1 kg)   SpO2 98%   BMI 23.10  kg/m                 Constitutional: Pt appears in NAD               HENT: Head: NCAT.                Right Ear: External ear normal.                 Left Ear: External ear normal.                Eyes: . Pupils are equal, round, and reactive to light. Conjunctivae and EOM are normal               Nose: without d/c or deformity               Neck: Neck supple. Gross normal ROM               Cardiovascular: Normal rate and regular rhythm.                 Pulmonary/Chest: Effort normal and breath sounds without rales or wheezing.                Abd:  Soft, NT, ND, + BS, no organomegaly               Neurological: Pt is alert. At baseline orientation, motor grossly intact               Skin: Skin is warm. No rashes, no other new lesions, LE edema - none               Psychiatric: Pt behavior is normal without agitation   Micro: none  Cardiac tracings I have personally interpreted today:  none  Pertinent Radiological findings (summarize): none   Lab Results  Component Value Date   WBC 5.8 06/21/2022   HGB 14.8 06/21/2022   HCT 44.9 06/21/2022   PLT 128.0 (L) 06/21/2022   GLUCOSE 96 06/21/2022   CHOL 158 06/29/2022   TRIG 228.0 (H) 06/29/2022   HDL 41.60 06/29/2022   LDLDIRECT 79.0 06/29/2022   LDLCALC 76 06/29/2021   ALT 14 06/21/2022   AST 28 06/21/2022   NA 136 06/21/2022   K 3.9 06/21/2022   CL 99 06/21/2022   CREATININE 1.15 06/21/2022   BUN 15  06/21/2022   CO2 31 06/21/2022   TSH 4.18 06/29/2022   PSA 6.56 (H) 06/29/2021   HGBA1C 6.4 06/29/2022   Assessment/Plan:  [name] is a 81 y.o. White or Caucasian [1] male with  has a past medical history of Aortic atherosclerosis ([place]) (06/21/2022), Arthritis, Atrial fibrillation ([place]), Benign prostatic hypertrophy, CAD (coronary artery disease) (07/2008), Carotid artery disease without cerebral infarction [facility]), CORONARY ARTERY BYPASS GRAFT, HX OF (07/13/2009), Ejection fraction, History of kidney stones, History of  nephrolithiasis, CABG, colonoscopy, Hyperlipidemia, HYPERLIPIDEMIA (02/09/2009), Increased prostate specific antigen (PSA) velocity, Myocardial infarction ([place]), NEPHROLITHIASIS, HX OF (02/09/2009), Prostatitis, PSA elevation (09/06/2012), and Thrombocytopenia ([place]).  Thrombocytopenia ([place]) Lab Results  Component Value Date   WBC 5.8 06/21/2022   HGB 14.8 06/21/2022   HCT 44.9 06/21/2022   MCV 91.1 06/21/2022   PLT 128.0 (L) 06/21/2022   Chronic stable, cont follow  Hyperlipidemia Lab Results  Component Value Date   LDLCALC 76 06/29/2021   uncontrolled, pt to continue current statin zocor 40 - Half qd per pt as he states this is what he can tolerate, delcines zetia or other change   Hyperglycemia Lab Results  Component Value Date   HGBA1C 6.4 06/29/2022   Stable, pt to continue current medical treatment  - diet, wt control   Aortic atherosclerosis ([place]) Pt to continue zocor 20 qd, exercise, low chol diet  Followup: Return in about 6 months (around 12/30/2022).  Cathlean Cower, MD 07/02/2022 1:11 PM [facility]

## 2022-06-29 NOTE — Patient Instructions (Signed)

## 2022-07-02 ENCOUNTER — Encounter: Payer: Self-pay | Admitting: Internal Medicine

## 2022-07-02 NOTE — Assessment & Plan Note (Signed)
Pt to continue zocor 20 qd, exercise, low chol diet

## 2022-07-02 NOTE — Assessment & Plan Note (Signed)
Lab Results  Component Value Date   WBC 5.8 06/21/2022   HGB 14.8 06/21/2022   HCT 44.9 06/21/2022   MCV 91.1 06/21/2022   PLT 128.0 (L) 06/21/2022   Chronic stable, cont follow

## 2022-07-02 NOTE — Assessment & Plan Note (Signed)
Lab Results  Component Value Date   HGBA1C 6.4 06/29/2022   Stable, pt to continue current medical treatment  - diet, wt control  

## 2022-07-02 NOTE — Assessment & Plan Note (Signed)
Lab Results  Component Value Date   LDLCALC 76 06/29/2021   uncontrolled, pt to continue current statin zocor 40 - Half qd per pt as he states this is what he can tolerate, delcines zetia or other change

## 2022-08-22 DIAGNOSIS — H903 Sensorineural hearing loss, bilateral: Secondary | ICD-10-CM | POA: Diagnosis not present

## 2022-08-23 DIAGNOSIS — H903 Sensorineural hearing loss, bilateral: Secondary | ICD-10-CM | POA: Diagnosis not present

## 2022-08-23 DIAGNOSIS — H93A1 Pulsatile tinnitus, right ear: Secondary | ICD-10-CM | POA: Diagnosis not present

## 2022-11-08 ENCOUNTER — Telehealth: Payer: Self-pay | Admitting: *Deleted

## 2022-11-08 NOTE — Telephone Encounter (Signed)
Patient contacted the office requesting to see Dr. Cyndia Bent. Per patient, he had bypass surgery by Dr. Cyndia Bent 12 years ago. Patient states he has questions related to his statin medication as well as stents. Advised patient to contact his Cardiologist, Dr. Stanford Breed, for appt. Patient states he has not seen Cardiology in a few years. Patient worried they "dropped" him due to not seeing eye to eye on statin drug therapy. Advised patient to give his office a call to schedule follow up appt as he may be able to see a different provider. Patient appreciative of return call.

## 2022-11-22 ENCOUNTER — Ambulatory Visit (INDEPENDENT_AMBULATORY_CARE_PROVIDER_SITE_OTHER): Payer: Medicare Other

## 2022-11-22 ENCOUNTER — Ambulatory Visit (INDEPENDENT_AMBULATORY_CARE_PROVIDER_SITE_OTHER): Payer: Medicare Other | Admitting: Internal Medicine

## 2022-11-22 VITALS — BP 134/80 | HR 57 | Temp 97.7°F | Ht 64.0 in | Wt 130.0 lb

## 2022-11-22 DIAGNOSIS — R739 Hyperglycemia, unspecified: Secondary | ICD-10-CM | POA: Diagnosis not present

## 2022-11-22 DIAGNOSIS — I2510 Atherosclerotic heart disease of native coronary artery without angina pectoris: Secondary | ICD-10-CM | POA: Diagnosis not present

## 2022-11-22 DIAGNOSIS — F411 Generalized anxiety disorder: Secondary | ICD-10-CM | POA: Diagnosis not present

## 2022-11-22 DIAGNOSIS — E7800 Pure hypercholesterolemia, unspecified: Secondary | ICD-10-CM | POA: Diagnosis not present

## 2022-11-22 DIAGNOSIS — R059 Cough, unspecified: Secondary | ICD-10-CM

## 2022-11-22 LAB — POC INFLUENZA A&B (BINAX/QUICKVUE)
Influenza A, POC: NEGATIVE
Influenza B, POC: NEGATIVE

## 2022-11-22 LAB — POC SOFIA SARS ANTIGEN FIA: SARS Coronavirus 2 Ag: NEGATIVE

## 2022-11-22 LAB — POCT RESPIRATORY SYNCYTIAL VIRUS: RSV Rapid Ag: NEGATIVE

## 2022-11-22 MED ORDER — AZITHROMYCIN 250 MG PO TABS: ORAL_TABLET | ORAL | 1 refills | Status: AC

## 2022-11-22 MED ORDER — HYDROCODONE BIT-HOMATROP MBR 5-1.5 MG/5ML PO SOLN: 5.0000 mL | Freq: Four times a day (QID) | ORAL | 0 refills | Status: AC | PRN

## 2022-11-22 NOTE — Patient Instructions (Addendum)
Your COVID, flu and RSV testing was negative  Please take all new medication as prescribed - the antibiotic, and cough medicine as needed  Please continue all other medications as before, and refills have been done if requested.  Please have the pharmacy call with any other refills you may need.  Please keep your appointments with your specialists as you may have planned  Please go to the XRAY Department in the first floor for the x-ray testing  You will be contacted by phone if any changes need to be made immediately.  Otherwise, you will receive a letter about your results with an explanation, but please check with MyChart first.  Please remember to sign up for MyChart if you have not done so, as this will be important to you in the future with finding out test results, communicating by private email, and scheduling acute appointments online when needed.  Please make an Appointment to return in 4 months, or sooner if needed

## 2022-11-22 NOTE — Progress Notes (Signed)
Patient ID: [name], male   DOB: February 22, 1941, 81 y.o.   MRN: [id number]        Chief Complaint: follow up prod cough, hyperglycemia, hld, anxiety       HPI:  [name] is a 81 y.o. male Here with acute onset mild to mod 2-3 weeks ST, HA, general weakness and malaise, with prod cough greenish sputum, was better at one point, then worse again in the past few days.  Whole family has been ill recently, but Pt denies chest pain, increased sob or doe, wheezing, orthopnea, PND, increased LE swelling, palpitations, dizziness or syncope.   Pt denies polydipsia, polyuria, or new focal neuro s/s.    Pt denies high fever, wt loss, night sweats, but has had worsening loss of appetite and several lbs wt loss.   Denies worsening depressive symptoms, suicidal ideation, or panic       Wt Readings from Last 3 Encounters:  11/22/22 130 lb (59 kg)  06/29/22 134 lb 9.6 oz (61.1 kg)  06/21/22 133 lb 6 oz (60.5 kg)   BP Readings from Last 3 Encounters:  11/22/22 134/80  06/29/22 134/70  06/21/22 136/84         Past Medical History:  Diagnosis Date   Aortic atherosclerosis ([place]) 06/21/2022   Arthritis    Atrial fibrillation ([place])    post MI-amiodarone stopped 07/2008   Benign prostatic hypertrophy    CAD (coronary artery disease) 07/2008   2 BMS placed for MI, August, 2009   Carotid artery disease without cerebral infarction [facility])    CORONARY ARTERY BYPASS GRAFT, HX OF 07/13/2009   August, 2009, Dr. Cyndia Bent   Ejection fraction    .   History of kidney stones    History of nephrolithiasis    Hx of CABG    Hx of colonoscopy    Hyperlipidemia    HYPERLIPIDEMIA 02/09/2009   Qualifier: Diagnosis of  By: Jenny Reichmann MD, Hunt Oris    Increased prostate specific antigen (PSA) velocity    Myocardial infarction ([place])    NEPHROLITHIASIS, HX OF 02/09/2009   Qualifier: Diagnosis of  By: Jenny Reichmann MD, Hunt Oris    Prostatitis    PSA elevation 09/06/2012   Thrombocytopenia [facility])    Past Surgical History:  Procedure  Laterality Date   CORONARY ARTERY BYPASS GRAFT  08/05/08   x5, LIMA to the LAD.    CORONARY STENT PLACEMENT  07/2008   2 BMS to RCA   [place]  04/16/2006   Left flank incisional hernia repair with mesh.  Dr Lonzo Cloud HERNIA REPAIR N/A 02/11/2021   Procedure: LAPAROSCOPIC REPAIR OF  INCISIONAL RECURRENT LEFT FLANK HERNIA WITH MESH TAP BLOCK - BILATERAL;  Surgeon: Michael Boston, MD;  Location: [facility];  Service: General;  Laterality: N/A;   left leg broken     PROSTATE BIOPSY  2007   neg   ROTATOR CUFF REPAIR     bilateral; Dr. Noemi Chapel    reports that he has never smoked. He has never used smokeless tobacco. He reports that he does not drink alcohol and does not use drugs. family history includes Healthy in his brother; Leukemia in his father; Lung cancer in his mother. Allergies  Allergen Reactions   Niacin Itching    Leg aches and itching   Current Outpatient Medications on File Prior to Visit  Medication Sig Dispense Refill   acetaminophen (  TYLENOL) 500 MG tablet Take 1,000 mg by mouth every 6 (six) hours as needed for mild pain.     aspirin 81 MG EC tablet Take 1 tablet (81 mg total) by mouth daily. Swallow whole. 30 tablet 12   Cholecalciferol (VITAMIN D) 50 MCG (2000 UT) CAPS Take 2,000 Units by mouth daily.     clobetasol (OLUX) 0.05 % topical foam Apply topically 2 (two) times daily. 50 g 5   Fluorouracil (TOLAK) 4 % CREA Apply to affected area nightly x 28 applications. 40 g 0   methocarbamol (ROBAXIN) 500 MG tablet      Multiple Vitamin (MULTIVITAMIN) tablet Take 1 tablet by mouth daily.     simvastatin (ZOCOR) 40 MG tablet TAKE 1/2 (ONE-HALF) TABLET BY MOUTH AT BEDTIME 45 tablet 3   No current facility-administered medications on file prior to visit.        ROS:  All others reviewed and negative.  Objective        PE:  BP 134/80 (BP Location: Left Arm, Patient Position: Sitting, Cuff Size: Large)    Pulse (!) 57   Temp 97.7 F (36.5 C) (Oral)   Ht '5\' 4"'$  (1.626 m)   Wt 130 lb (59 kg)   SpO2 97%   BMI 22.31 kg/m                 Constitutional: Pt appears in NAD               HENT: Head: NCAT.                Right Ear: External ear normal.                 Left Ear: External ear normal.                Eyes: . Pupils are equal, round, and reactive to light. Conjunctivae and EOM are normal               Nose: without d/c or deformity               Neck: Neck supple. Gross normal ROM               Cardiovascular: Normal rate and regular rhythm.                 Pulmonary/Chest: Effort normal and breath sounds without rales or wheezing.                Abd:  Soft, NT, ND, + BS, no organomegaly               Neurological: Pt is alert. At baseline orientation, motor grossly intact               Skin: Skin is warm. No rashes, no other new lesions, LE edema - none               Psychiatric: Pt behavior is normal without agitation   Micro: none  Cardiac tracings I have personally interpreted today:  none  Pertinent Radiological findings (summarize): none   Lab Results  Component Value Date   WBC 5.8 06/21/2022   HGB 14.8 06/21/2022   HCT 44.9 06/21/2022   PLT 128.0 (L) 06/21/2022   GLUCOSE 96 06/21/2022   CHOL 158 06/29/2022   TRIG 228.0 (H) 06/29/2022   HDL 41.60 06/29/2022   LDLDIRECT 79.0 06/29/2022   LDLCALC 76 06/29/2021   ALT 14 06/21/2022   AST  28 06/21/2022   NA 136 06/21/2022   K 3.9 06/21/2022   CL 99 06/21/2022   CREATININE 1.15 06/21/2022   BUN 15 06/21/2022   CO2 31 06/21/2022   TSH 4.18 06/29/2022   PSA 6.56 (H) 06/29/2021   HGBA1C 6.4 06/29/2022   POCT- today Covid - neg, Flu A/B  - neg, and RSV negative  Assessment/Plan:  [name] is a 81 y.o. White or Caucasian [1] male with  has a past medical history of Aortic atherosclerosis ([place]) (06/21/2022), Arthritis, Atrial fibrillation ([place]), Benign prostatic hypertrophy, CAD (coronary artery disease)  (07/2008), Carotid artery disease without cerebral infarction [facility] CORONARY ARTERY BYPASS GRAFT, HX OF (07/13/2009), Ejection fraction, History of kidney stones, History of nephrolithiasis, CABG, colonoscopy, Hyperlipidemia, HYPERLIPIDEMIA (02/09/2009), Increased prostate specific antigen (PSA) velocity, Myocardial infarction ([place]), NEPHROLITHIASIS, HX OF (02/09/2009), Prostatitis, PSA elevation (09/06/2012), and Thrombocytopenia ([place]).  Anxiety state Mild situationally worse today due to illness it seems, reassured, cont same tx,  to f/u any worsening symptoms or concerns  Hyperglycemia Lab Results  Component Value Date   HGBA1C 6.4 06/29/2022   Stable, pt to continue current medical treatment  - diet, wt control, excercise   Hyperlipidemia Lab Results  Component Value Date   LDLCALC 76 06/29/2021   Uncontrolled, goal ldl < 70,, pt to continue current statin zocor 40 mg and lower chol diet, declines other change   Cough Mild to mod, cannot r/o pna vs bronchitis, for antibx course, cough med prn, for cxr, to f/u any worsening symptoms or concerns  Followup: Return in about 4 months (around 03/24/2023).  Cathlean Cower, MD 11/25/2022 6:14 AM [facility]

## 2022-11-25 ENCOUNTER — Encounter: Payer: Self-pay | Admitting: Internal Medicine

## 2022-11-25 NOTE — Assessment & Plan Note (Addendum)
Mild to mod, cannot r/o pna vs bronchitis, for antibx course, cough med prn, for cxr, to f/u any worsening symptoms or concerns

## 2022-11-25 NOTE — Assessment & Plan Note (Signed)
Lab Results  Component Value Date   LDLCALC 76 06/29/2021   Uncontrolled, goal ldl < 70,, pt to continue current statin zocor 40 mg and lower chol diet, declines other change

## 2022-11-25 NOTE — Assessment & Plan Note (Signed)
Mild situationally worse today due to illness it seems, reassured, cont same tx,  to f/u any worsening symptoms or concerns

## 2022-11-25 NOTE — Assessment & Plan Note (Signed)
Lab Results  Component Value Date   HGBA1C 6.4 06/29/2022   Stable, pt to continue current medical treatment  - diet, wt control, excercise

## 2022-12-14 NOTE — Progress Notes (Deleted)
    Benito Mccreedy D.[place] Phone: [phone]   Assessment and Plan:     There are no diagnoses linked to this encounter.  ***   Pertinent previous records reviewed include ***   Follow Up: ***     Subjective:   I, [name], am serving as a Education administrator for Doctor Glennon Mac  Chief Complaint: bilateral hamstring pain   HPI:   12/15/2022 Patient is a 82 year old male complaining of bilateral hamstring pain. Patient states   Relevant Historical Information: ***  Additional pertinent review of systems negative.   Current Outpatient Medications:    acetaminophen (TYLENOL) 500 MG tablet, Take 1,000 mg by mouth every 6 (six) hours as needed for mild pain., Disp: , Rfl:    aspirin 81 MG EC tablet, Take 1 tablet (81 mg total) by mouth daily. Swallow whole., Disp: 30 tablet, Rfl: 12   Cholecalciferol (VITAMIN D) 50 MCG (2000 UT) CAPS, Take 2,000 Units by mouth daily., Disp: , Rfl:    clobetasol (OLUX) 0.05 % topical foam, Apply topically 2 (two) times daily., Disp: 50 g, Rfl: 5   Fluorouracil (TOLAK) 4 % CREA, Apply to affected area nightly x 28 applications., Disp: 40 g, Rfl: 0   methocarbamol (ROBAXIN) 500 MG tablet, , Disp: , Rfl:    Multiple Vitamin (MULTIVITAMIN) tablet, Take 1 tablet by mouth daily., Disp: , Rfl:    simvastatin (ZOCOR) 40 MG tablet, TAKE 1/2 (ONE-HALF) TABLET BY MOUTH AT BEDTIME, Disp: 45 tablet, Rfl: 3   Objective:     There were no vitals filed for this visit.    There is no height or weight on file to calculate BMI.    Physical Exam:    ***   Electronically signed by:  Benito Mccreedy D.[facility] 10:39 AM 12/14/22

## 2022-12-15 ENCOUNTER — Ambulatory Visit: Payer: Medicare Other | Admitting: Sports Medicine

## 2022-12-21 ENCOUNTER — Ambulatory Visit: Payer: Medicare Other | Admitting: Family Medicine

## 2023-01-09 DIAGNOSIS — H5203 Hypermetropia, bilateral: Secondary | ICD-10-CM | POA: Diagnosis not present

## 2023-01-09 DIAGNOSIS — H2512 Age-related nuclear cataract, left eye: Secondary | ICD-10-CM | POA: Diagnosis not present

## 2023-01-09 DIAGNOSIS — Z961 Presence of intraocular lens: Secondary | ICD-10-CM | POA: Diagnosis not present

## 2023-02-09 ENCOUNTER — Ambulatory Visit (INDEPENDENT_AMBULATORY_CARE_PROVIDER_SITE_OTHER): Payer: Medicare Other | Admitting: Internal Medicine

## 2023-02-09 VITALS — BP 128/76 | HR 75 | Temp 98.1°F | Ht 64.0 in | Wt 134.0 lb

## 2023-02-09 DIAGNOSIS — R21 Rash and other nonspecific skin eruption: Secondary | ICD-10-CM | POA: Diagnosis not present

## 2023-02-09 DIAGNOSIS — R739 Hyperglycemia, unspecified: Secondary | ICD-10-CM | POA: Diagnosis not present

## 2023-02-09 DIAGNOSIS — F32A Depression, unspecified: Secondary | ICD-10-CM

## 2023-02-09 DIAGNOSIS — J069 Acute upper respiratory infection, unspecified: Secondary | ICD-10-CM | POA: Diagnosis not present

## 2023-02-09 DIAGNOSIS — R059 Cough, unspecified: Secondary | ICD-10-CM | POA: Diagnosis not present

## 2023-02-09 LAB — POC INFLUENZA A&B (BINAX/QUICKVUE)
Influenza A, POC: NEGATIVE
Influenza B, POC: NEGATIVE

## 2023-02-09 LAB — POCT RESPIRATORY SYNCYTIAL VIRUS: RSV Rapid Ag: NEGATIVE

## 2023-02-09 LAB — POC SOFIA SARS ANTIGEN FIA: SARS Coronavirus 2 Ag: NEGATIVE

## 2023-02-09 MED ORDER — HYDROCODONE BIT-HOMATROP MBR 5-1.5 MG/5ML PO SOLN: 5.0000 mL | Freq: Four times a day (QID) | ORAL | 0 refills | Status: AC | PRN

## 2023-02-09 MED ORDER — AZITHROMYCIN 250 MG PO TABS: ORAL_TABLET | ORAL | 1 refills | Status: AC

## 2023-02-09 NOTE — Progress Notes (Unsigned)
Patient ID: [name], male   DOB: Jan 17, 1941, 82 y.o.   MRN: [id number]        Chief Complaint: follow up sinus pain and congestion,        HPI:  [name] is a 82 y.o. male  Here with 2-3 days acute onset fever, facial pain, pressure, headache, general weakness and malaise, and greenish d/c, with mild ST and cough, but pt denies chest pain, wheezing, increased sob or doe, orthopnea, PND, increased LE swelling, palpitations, dizziness or syncope.   Pt denies polydipsia, polyuria, or new focal neuro s/s.    Pt denies wt loss, night sweats, loss of appetite, or other constitutional symptoms   Denies worsening depressive symptoms, suicidal ideation, or panic       Wt Readings from Last 3 Encounters:  02/09/23 134 lb (60.8 kg)  11/22/22 130 lb (59 kg)  06/29/22 134 lb 9.6 oz (61.1 kg)   BP Readings from Last 3 Encounters:  02/09/23 128/76  11/22/22 134/80  06/29/22 134/70         Past Medical History:  Diagnosis Date   Aortic atherosclerosis ([place]) 06/21/2022   Arthritis    Atrial fibrillation ([place])    post MI-amiodarone stopped 07/2008   Benign prostatic hypertrophy    CAD (coronary artery disease) 07/2008   2 BMS placed for MI, August, 2009   Carotid artery disease without cerebral infarction [facility])    CORONARY ARTERY BYPASS GRAFT, HX OF 07/13/2009   August, 2009, Dr. Cyndia Bent   Ejection fraction    .   History of kidney stones    History of nephrolithiasis    Hx of CABG    Hx of colonoscopy    Hyperlipidemia    HYPERLIPIDEMIA 02/09/2009   Qualifier: Diagnosis of  By: Jenny Reichmann MD, Hunt Oris    Increased prostate specific antigen (PSA) velocity    Myocardial infarction ([place])    NEPHROLITHIASIS, HX OF 02/09/2009   Qualifier: Diagnosis of  By: Jenny Reichmann MD, Hunt Oris    Prostatitis    PSA elevation 09/06/2012   Thrombocytopenia [facility])    Past Surgical History:  Procedure Laterality Date   CORONARY ARTERY BYPASS GRAFT  08/05/08   x5, LIMA to the LAD.    CORONARY STENT PLACEMENT  07/2008    2 BMS to RCA   [place]  04/16/2006   Left flank incisional hernia repair with mesh.  Dr Lonzo Cloud HERNIA REPAIR N/A 02/11/2021   Procedure: LAPAROSCOPIC REPAIR OF  INCISIONAL RECURRENT LEFT FLANK HERNIA WITH MESH TAP BLOCK - BILATERAL;  Surgeon: Michael Boston, MD;  Location: [facility];  Service: General;  Laterality: N/A;   left leg broken     PROSTATE BIOPSY  2007   neg   ROTATOR CUFF REPAIR     bilateral; Dr. Noemi Chapel    reports that he has never smoked. He has never used smokeless tobacco. He reports that he does not drink alcohol and does not use drugs. family history includes Healthy in his brother; Leukemia in his father; Lung cancer in his mother. Allergies  Allergen Reactions   Augmentin [Amoxicillin-Pot Clavulanate] Other (See Comments)    C diff diarrhea   Niacin Itching    Leg aches and itching   Current Outpatient Medications on File Prior to Visit  Medication Sig Dispense Refill   acetaminophen (TYLENOL) 500 MG tablet Take 1,000 mg by mouth every 6 (  six) hours as needed for mild pain.     aspirin 81 MG EC tablet Take 1 tablet (81 mg total) by mouth daily. Swallow whole. 30 tablet 12   Cholecalciferol (VITAMIN D) 50 MCG (2000 UT) CAPS Take 2,000 Units by mouth daily.     clobetasol (OLUX) 0.05 % topical foam Apply topically 2 (two) times daily. 50 g 5   Fluorouracil (TOLAK) 4 % CREA Apply to affected area nightly x 28 applications. 40 g 0   methocarbamol (ROBAXIN) 500 MG tablet      Multiple Vitamin (MULTIVITAMIN) tablet Take 1 tablet by mouth daily.     simvastatin (ZOCOR) 40 MG tablet TAKE 1/2 (ONE-HALF) TABLET BY MOUTH AT BEDTIME 45 tablet 3   No current facility-administered medications on file prior to visit.        ROS:  All others reviewed and negative.  Objective        PE:  BP 128/76 (BP Location: Left Arm, Patient Position: Sitting, Cuff Size: Large)   Pulse 75   Temp 98.1 F (36.7 C)  (Oral)   Ht '5\' 4"'$  (1.626 m)   Wt 134 lb (60.8 kg)   SpO2 95%   BMI 23.00 kg/m                 Constitutional: Pt appears mild ill               HENT: Head: NCAT.                Right Ear: External ear normal.                 Left Ear: External ear normal. Bilat tm's with mild erythema.  Max sinus areas mild tender.  Pharynx with mild erythema, no exudate               Eyes: . Pupils are equal, round, and reactive to light. Conjunctivae and EOM are normal               Nose: without d/c or deformity               Neck: Neck supple. Gross normal ROM               Cardiovascular: Normal rate and regular rhythm.                 Pulmonary/Chest: Effort normal and breath sounds without rales or wheezing.                               Neurological: Pt is alert. At baseline orientation, motor grossly intact               Skin: Skin is warm. No rashes, no other new lesions, LE edema - none               Psychiatric: Pt behavior is normal without agitation , not depressed affect  Micro: none  Cardiac tracings I have personally interpreted today:  none  Pertinent Radiological findings (summarize): none   Lab Results  Component Value Date   WBC 5.8 06/21/2022   HGB 14.8 06/21/2022   HCT 44.9 06/21/2022   PLT 128.0 (L) 06/21/2022   GLUCOSE 96 06/21/2022   CHOL 158 06/29/2022   TRIG 228.0 (H) 06/29/2022   HDL 41.60 06/29/2022   LDLDIRECT 79.0 06/29/2022   LDLCALC 76 06/29/2021   ALT 14 06/21/2022   AST  28 06/21/2022   NA 136 06/21/2022   K 3.9 06/21/2022   CL 99 06/21/2022   CREATININE 1.15 06/21/2022   BUN 15 06/21/2022   CO2 31 06/21/2022   TSH 4.18 06/29/2022   PSA 6.56 (H) 06/29/2021   HGBA1C 6.4 06/29/2022   POCT  - COVID neg, Flu - neg, RSV - neg  Assessment/Plan:  [name] is a 82 y.o. White or Caucasian [1] male with  has a past medical history of Aortic atherosclerosis ([place]) (06/21/2022), Arthritis, Atrial fibrillation ([place]), Benign prostatic hypertrophy, CAD  (coronary artery disease) (07/2008), Carotid artery disease without cerebral infarction [facility]), CORONARY ARTERY BYPASS GRAFT, HX OF (07/13/2009), Ejection fraction, History of kidney stones, History of nephrolithiasis, CABG, colonoscopy, Hyperlipidemia, HYPERLIPIDEMIA (02/09/2009), Increased prostate specific antigen (PSA) velocity, Myocardial infarction ([place]), NEPHROLITHIASIS, HX OF (02/09/2009), Prostatitis, PSA elevation (09/06/2012), and Thrombocytopenia ([place]).  Acute upper respiratory infection Viral testing neg, Mild to mod, for antibx course zpack and cough med prn,  to f/u any worsening symptoms or concerns  Hyperglycemia Lab Results  Component Value Date   HGBA1C 6.4 06/29/2022   Stable, pt to continue current medical treatment  - diet, wt control   Depression Stable overall, declines need for change in tx or counseling  Followup: Return if symptoms worsen or fail to improve.  Cathlean Cower, MD 02/11/2023 6:23 PM [facility]

## 2023-02-09 NOTE — Patient Instructions (Addendum)
Your COVID, Flu, and RSV testing is negative  Please take all new medication as prescribed - the antibiotic, cough medicine  Please continue all other medications as before, and refills have been done if requested.  Please have the pharmacy call with any other refills you may need.  Please keep your appointments with your specialists as you may have planned

## 2023-02-11 ENCOUNTER — Encounter: Payer: Self-pay | Admitting: Internal Medicine

## 2023-02-11 NOTE — Assessment & Plan Note (Signed)
Stable overall, declines need for change in tx or counseling

## 2023-02-11 NOTE — Assessment & Plan Note (Signed)
Lab Results  Component Value Date   HGBA1C 6.4 06/29/2022   Stable, pt to continue current medical treatment  - diet, wt control

## 2023-02-11 NOTE — Assessment & Plan Note (Signed)
Viral testing neg, Mild to mod, for antibx course zpack and cough med prn,  to f/u any worsening symptoms or concerns

## 2023-02-15 ENCOUNTER — Ambulatory Visit (INDEPENDENT_AMBULATORY_CARE_PROVIDER_SITE_OTHER): Payer: Medicare Other | Admitting: Internal Medicine

## 2023-02-15 VITALS — BP 138/80 | HR 70 | Temp 97.8°F | Ht 64.0 in | Wt 128.0 lb

## 2023-02-15 DIAGNOSIS — R634 Abnormal weight loss: Secondary | ICD-10-CM | POA: Diagnosis not present

## 2023-02-15 DIAGNOSIS — J309 Allergic rhinitis, unspecified: Secondary | ICD-10-CM

## 2023-02-15 DIAGNOSIS — R739 Hyperglycemia, unspecified: Secondary | ICD-10-CM | POA: Diagnosis not present

## 2023-02-15 DIAGNOSIS — F411 Generalized anxiety disorder: Secondary | ICD-10-CM

## 2023-02-15 DIAGNOSIS — E7800 Pure hypercholesterolemia, unspecified: Secondary | ICD-10-CM

## 2023-02-15 MED ORDER — PAROXETINE HCL 10 MG PO TABS: 10.0000 mg | ORAL_TABLET | Freq: Every day | ORAL | 3 refills | Status: DC

## 2023-02-15 NOTE — Progress Notes (Signed)
Patient ID: [name], male   DOB: 1941/07/08, 82 y.o.   MRN: [id number]        Chief Complaint: follow up wt loss, allergies, anxiety, hyperglycemia, hld       HPI:  [name] is a 82 y.o. male here with imploring of "make me feel better" though can't be more specific as fare as symptoms,; Pt denies chest pain, increased sob or doe, wheezing, orthopnea, PND, increased LE swelling, palpitations, dizziness or syncope.   Pt denies polydipsia, polyuria, or new focal neuro s/s.    Pt denies fever, night sweats, loss of appetite, or other constitutional symptoms, though has had apparently worsening subjective anxiety and emotional stress along with persistent several lbs wt loss getting worse, with lack of stamina and energy it seems.  No specific worsening ambulatory difficulty or risk of falling though appears generally slowed.   Denies worsening depressive symptoms, suicidal ideation, or panic; though appears to have low insght into this today.  Does have several wks ongoing nasal allergy symptoms with clearish congestion, itch and sneezing, without fever, pain, ST, cough, swelling or wheezing.  Denies worsening reflux, abd pain, dysphagia, n/v, bowel change or blood.  No worsening joint  or other pain,  Last cxr dec 2023 neg for acute.  Declines counseling referral or geriatric referral.         Wt Readings from Last 3 Encounters:  02/15/23 128 lb (58.1 kg)  02/09/23 134 lb (60.8 kg)  11/22/22 130 lb (59 kg)   BP Readings from Last 3 Encounters:  02/15/23 138/80  02/09/23 128/76  11/22/22 134/80         Past Medical History:  Diagnosis Date   Aortic atherosclerosis ([place]) 06/21/2022   Arthritis    Atrial fibrillation ([place])    post MI-amiodarone stopped 07/2008   Benign prostatic hypertrophy    CAD (coronary artery disease) 07/2008   2 BMS placed for MI, August, 2009   Carotid artery disease without cerebral infarction [facility])    CORONARY ARTERY BYPASS GRAFT, HX OF 07/13/2009   August,  2009, Dr. Cyndia Bent   Ejection fraction    .   History of kidney stones    History of nephrolithiasis    Hx of CABG    Hx of colonoscopy    Hyperlipidemia    HYPERLIPIDEMIA 02/09/2009   Qualifier: Diagnosis of  By: Jenny Reichmann MD, Hunt Oris    Increased prostate specific antigen (PSA) velocity    Myocardial infarction ([place])    NEPHROLITHIASIS, HX OF 02/09/2009   Qualifier: Diagnosis of  By: Jenny Reichmann MD, Hunt Oris    Prostatitis    PSA elevation 09/06/2012   Thrombocytopenia [facility])    Past Surgical History:  Procedure Laterality Date   CORONARY ARTERY BYPASS GRAFT  08/05/08   x5, LIMA to the LAD.    CORONARY STENT PLACEMENT  07/2008   2 BMS to RCA   [place]  04/16/2006   Left flank incisional hernia repair with mesh.  Dr Lonzo Cloud HERNIA REPAIR N/A 02/11/2021   Procedure: LAPAROSCOPIC REPAIR OF  INCISIONAL RECURRENT LEFT FLANK HERNIA WITH MESH TAP BLOCK - BILATERAL;  Surgeon: Michael Boston, MD;  Location: [facility];  Service: General;  Laterality: N/A;   left leg broken     PROSTATE BIOPSY  2007   neg   ROTATOR CUFF REPAIR     bilateral; Dr. Noemi Chapel  reports that he has never smoked. He has never used smokeless tobacco. He reports that he does not drink alcohol and does not use drugs. family history includes Healthy in his brother; Leukemia in his father; Lung cancer in his mother. Allergies  Allergen Reactions   Augmentin [Amoxicillin-Pot Clavulanate] Other (See Comments)    C diff diarrhea   Niacin Itching    Leg aches and itching   Current Outpatient Medications on File Prior to Visit  Medication Sig Dispense Refill   acetaminophen (TYLENOL) 500 MG tablet Take 1,000 mg by mouth every 6 (six) hours as needed for mild pain.     aspirin 81 MG EC tablet Take 1 tablet (81 mg total) by mouth daily. Swallow whole. 30 tablet 12   Cholecalciferol (VITAMIN D) 50 MCG (2000 UT) CAPS Take 2,000 Units by mouth daily.     clobetasol (OLUX)  0.05 % topical foam Apply topically 2 (two) times daily. 50 g 5   Fluorouracil (TOLAK) 4 % CREA Apply to affected area nightly x 28 applications. 40 g 0   HYDROcodone bit-homatropine (HYCODAN) 5-1.5 MG/5ML syrup Take 5 mLs by mouth every 6 (six) hours as needed for up to 10 days. 180 mL 0   methocarbamol (ROBAXIN) 500 MG tablet      Multiple Vitamin (MULTIVITAMIN) tablet Take 1 tablet by mouth daily.     simvastatin (ZOCOR) 40 MG tablet TAKE 1/2 (ONE-HALF) TABLET BY MOUTH AT BEDTIME 45 tablet 3   No current facility-administered medications on file prior to visit.        ROS:  All others reviewed and negative.  Objective        PE:  BP 138/80   Pulse 70   Temp 97.8 F (36.6 C) (Oral)   Ht '5\' 4"'$  (1.626 m)   Wt 128 lb (58.1 kg)   SpO2 90%   BMI 21.97 kg/m                 Constitutional: Pt appears in NAD               HENT: Head: NCAT.                Right Ear: External ear normal.                 Left Ear: External ear normal.  Bilat tm's with mild erythema.  Max sinus areas non tender.  Pharynx with mild erythema, no exudate               Eyes: . Pupils are equal, round, and reactive to light. Conjunctivae and EOM are normal               Nose: without d/c or deformity               Neck: Neck supple. Gross normal ROM               Cardiovascular: Normal rate and regular rhythm.                 Pulmonary/Chest: Effort normal and breath sounds without rales or wheezing.                Abd:  Soft, NT, ND, + BS, no organomegaly               Neurological: Pt is alert. At baseline orientation, motor grossly intact               Skin:  Skin is warm. No rashes, no other new lesions, LE edema - none               Psychiatric: Pt behavior is normal without agitation but mod nervous, depressed affect  Micro: none  Cardiac tracings I have personally interpreted today:  none  Pertinent Radiological findings (summarize): none   Lab Results  Component Value Date   WBC 5.8 06/21/2022    HGB 14.8 06/21/2022   HCT 44.9 06/21/2022   PLT 128.0 (L) 06/21/2022   GLUCOSE 96 06/21/2022   CHOL 158 06/29/2022   TRIG 228.0 (H) 06/29/2022   HDL 41.60 06/29/2022   LDLDIRECT 79.0 06/29/2022   LDLCALC 76 06/29/2021   ALT 14 06/21/2022   AST 28 06/21/2022   NA 136 06/21/2022   K 3.9 06/21/2022   CL 99 06/21/2022   CREATININE 1.15 06/21/2022   BUN 15 06/21/2022   CO2 31 06/21/2022   TSH 4.18 06/29/2022   PSA 6.56 (H) 06/29/2021   HGBA1C 6.4 06/29/2022   Assessment/Plan:  [name] is a 82 y.o. White or Caucasian [1] male with  has a past medical history of Aortic atherosclerosis ([place]) (06/21/2022), Arthritis, Atrial fibrillation ([place]), Benign prostatic hypertrophy, CAD (coronary artery disease) (07/2008), Carotid artery disease without cerebral infarction [facility]), CORONARY ARTERY BYPASS GRAFT, HX OF (07/13/2009), Ejection fraction, History of kidney stones, History of nephrolithiasis, CABG, colonoscopy, Hyperlipidemia, HYPERLIPIDEMIA (02/09/2009), Increased prostate specific antigen (PSA) velocity, Myocardial infarction ([place]), NEPHROLITHIASIS, HX OF (02/09/2009), Prostatitis, PSA elevation (09/06/2012), and Thrombocytopenia ([place]).  Allergic rhinitis Mild to mod, for allegra and nasacort asd,  to f/u any worsening symptoms or concerns   Anxiety state Worsening I suspect related to geriatric decline to which he laments and loathe to accept; I have asked him to take paxil 10 mg daily, hopefully to assist with appetite as well, and probable underlying depression to which he denies  Hyperlipidemia Lab Results  Component Value Date   [place] 76 06/29/2021   Uncontrolled, goal ldl < 70, , pt to continue current statin zocor 40 mg qd as declines change today   Hyperglycemia Lab Results  Component Value Date   HGBA1C 6.4 06/29/2022   Stable, pt to continue current medical treatment  - diet, wt control   Weight loss Differential is diverse to include worsening rheumatologic, GI,  oncologic etiologies but lacks symptoms signs to suggests any or this; primarily appears to have geriatric decline with worsening anxiety, declines counseling or geriatric referral ; pt encouraged to add boost supplement bid  Followup: Return in about 3 months (around 05/18/2023).  Cathlean Cower, MD 02/17/2023 6:26 AM [facility]

## 2023-02-15 NOTE — Patient Instructions (Addendum)
Please take all new medication as prescribed - the OTC allegra and nasacort  Please take all new medication as prescribed - the paxil for nerves  Please continue all other medications as before, and refills have been done if requested.  Please have the pharmacy call with any other refills you may need.  Please keep your appointments with your specialists as you may have planned  Please make an Appointment to return in 3 months, or sooner if needed

## 2023-02-17 ENCOUNTER — Encounter: Payer: Self-pay | Admitting: Internal Medicine

## 2023-02-17 DIAGNOSIS — R634 Abnormal weight loss: Secondary | ICD-10-CM | POA: Insufficient documentation

## 2023-02-17 DIAGNOSIS — J309 Allergic rhinitis, unspecified: Secondary | ICD-10-CM | POA: Insufficient documentation

## 2023-02-17 NOTE — Assessment & Plan Note (Signed)
Lab Results  Component Value Date   LDLCALC 76 06/29/2021   Uncontrolled, goal ldl < 70, , pt to continue current statin zocor 40 mg qd as declines change today

## 2023-02-17 NOTE — Assessment & Plan Note (Signed)
Lab Results  Component Value Date   HGBA1C 6.4 06/29/2022   Stable, pt to continue current medical treatment  - diet, wt control  

## 2023-02-17 NOTE — Assessment & Plan Note (Addendum)
Differential is diverse to include worsening rheumatologic, GI, oncologic etiologies but lacks symptoms signs to suggests any or this; primarily appears to have geriatric decline with worsening anxiety, declines counseling or geriatric referral ; pt encouraged to add boost supplement bid

## 2023-02-17 NOTE — Assessment & Plan Note (Addendum)
Worsening I suspect related to geriatric decline to which he laments and loathe to accept; I have asked him to take paxil 10 mg daily, hopefully to assist with appetite as well, and probable underlying depression to which he denies

## 2023-02-17 NOTE — Assessment & Plan Note (Signed)
Mild to mod, for allegra and nasacort asd,   to f/u any worsening symptoms or concerns 

## 2023-03-27 ENCOUNTER — Ambulatory Visit: Payer: Medicare Other | Admitting: Internal Medicine

## 2023-05-22 ENCOUNTER — Ambulatory Visit: Payer: Medicare Other | Admitting: Internal Medicine

## 2023-05-31 ENCOUNTER — Encounter: Payer: Self-pay | Admitting: Internal Medicine

## 2023-05-31 ENCOUNTER — Ambulatory Visit (INDEPENDENT_AMBULATORY_CARE_PROVIDER_SITE_OTHER): Payer: Medicare Other | Admitting: Internal Medicine

## 2023-05-31 VITALS — BP 118/72 | HR 55 | Temp 98.1°F | Ht 64.0 in | Wt 132.0 lb

## 2023-05-31 DIAGNOSIS — R739 Hyperglycemia, unspecified: Secondary | ICD-10-CM

## 2023-05-31 DIAGNOSIS — R972 Elevated prostate specific antigen [PSA]: Secondary | ICD-10-CM | POA: Diagnosis not present

## 2023-05-31 DIAGNOSIS — E538 Deficiency of other specified B group vitamins: Secondary | ICD-10-CM

## 2023-05-31 DIAGNOSIS — I2510 Atherosclerotic heart disease of native coronary artery without angina pectoris: Secondary | ICD-10-CM | POA: Diagnosis not present

## 2023-05-31 DIAGNOSIS — E559 Vitamin D deficiency, unspecified: Secondary | ICD-10-CM | POA: Diagnosis not present

## 2023-05-31 DIAGNOSIS — E7800 Pure hypercholesterolemia, unspecified: Secondary | ICD-10-CM

## 2023-05-31 LAB — URINALYSIS, ROUTINE W REFLEX MICROSCOPIC
Bilirubin Urine: NEGATIVE
Hgb urine dipstick: NEGATIVE
Ketones, ur: NEGATIVE
Leukocytes,Ua: NEGATIVE
Nitrite: NEGATIVE
Specific Gravity, Urine: 1.015 (ref 1.000–1.030)
Total Protein, Urine: NEGATIVE
Urine Glucose: NEGATIVE
Urobilinogen, UA: 0.2 (ref 0.0–1.0)
pH: 5.5 (ref 5.0–8.0)

## 2023-05-31 LAB — CBC WITH DIFFERENTIAL/PLATELET
Basophils Absolute: 0 10*3/uL (ref 0.0–0.1)
Basophils Relative: 0.4 % (ref 0.0–3.0)
Eosinophils Absolute: 0.2 10*3/uL (ref 0.0–0.7)
Eosinophils Relative: 2.7 % (ref 0.0–5.0)
HCT: 41.9 % (ref 39.0–52.0)
Hemoglobin: 13.9 g/dL (ref 13.0–17.0)
Lymphocytes Relative: 38.6 % (ref 12.0–46.0)
Lymphs Abs: 2.4 10*3/uL (ref 0.7–4.0)
MCHC: 33.1 g/dL (ref 30.0–36.0)
MCV: 91.6 fl (ref 78.0–100.0)
Monocytes Absolute: 0.6 10*3/uL (ref 0.1–1.0)
Monocytes Relative: 9.9 % (ref 3.0–12.0)
Neutro Abs: 3 10*3/uL (ref 1.4–7.7)
Neutrophils Relative %: 48.4 % (ref 43.0–77.0)
Platelets: 132 10*3/uL — ABNORMAL LOW (ref 150.0–400.0)
RBC: 4.57 Mil/uL (ref 4.22–5.81)
RDW: 13 % (ref 11.5–15.5)
WBC: 6.3 10*3/uL (ref 4.0–10.5)

## 2023-05-31 LAB — BASIC METABOLIC PANEL
BUN: 16 mg/dL (ref 6–23)
CO2: 28 mEq/L (ref 19–32)
Calcium: 9.5 mg/dL (ref 8.4–10.5)
Chloride: 102 mEq/L (ref 96–112)
Creatinine, Ser: 1.16 mg/dL (ref 0.40–1.50)
GFR: 59.16 mL/min — ABNORMAL LOW (ref >60.00)
Glucose, Bld: 95 mg/dL (ref 70–99)
Potassium: 4.5 mEq/L (ref 3.5–5.1)
Sodium: 139 mEq/L (ref 135–145)

## 2023-05-31 LAB — PSA: PSA: 5.72 ng/mL — ABNORMAL HIGH (ref 0.10–4.00)

## 2023-05-31 LAB — HEPATIC FUNCTION PANEL
ALT: 15 U/L (ref 0–53)
AST: 28 U/L (ref 0–37)
Albumin: 4.3 g/dL (ref 3.5–5.2)
Alkaline Phosphatase: 48 U/L (ref 39–117)
Bilirubin, Direct: 0.1 mg/dL (ref 0.0–0.3)
Total Bilirubin: 0.6 mg/dL (ref 0.2–1.2)
Total Protein: 7.2 g/dL (ref 6.0–8.3)

## 2023-05-31 LAB — LIPID PANEL
Cholesterol: 119 mg/dL (ref 0–200)
HDL: 43.7 mg/dL (ref >39.00)
LDL Cholesterol: 47 mg/dL (ref 0–99)
NonHDL: 75.33
Total CHOL/HDL Ratio: 3
Triglycerides: 141 mg/dL (ref 0.0–149.0)
VLDL: 28.2 mg/dL (ref 0.0–40.0)

## 2023-05-31 LAB — TSH: TSH: 2.75 u[IU]/mL (ref 0.35–5.50)

## 2023-05-31 LAB — HEMOGLOBIN A1C: Hgb A1c MFr Bld: 6.1 % (ref 4.6–6.5)

## 2023-05-31 LAB — VITAMIN D 25 HYDROXY (VIT D DEFICIENCY, FRACTURES): VITD: 64.08 ng/mL (ref 30.00–100.00)

## 2023-05-31 LAB — VITAMIN B12: Vitamin B-12: 449 pg/mL (ref 211–911)

## 2023-05-31 MED ORDER — SIMVASTATIN 40 MG PO TABS: ORAL_TABLET | ORAL | 3 refills | Status: DC

## 2023-05-31 MED ORDER — PAROXETINE HCL 10 MG PO TABS: 10.0000 mg | ORAL_TABLET | Freq: Every day | ORAL | 3 refills | Status: DC

## 2023-05-31 NOTE — Progress Notes (Signed)
Patient ID: [name], male   DOB: [birth year], 82 y.o.   MRN: [id number]        Chief Complaint: follow up HLD and hyperglycemia, elevated psa, cad       HPI:  [name] is a 82 y.o. male here overall doing ok, Pt denies chest pain, increased sob or doe, wheezing, orthopnea, PND, increased LE swelling, palpitations, dizziness or syncope.   Pt denies polydipsia, polyuria, or new focal neuro s/s.    Pt denies fever, wt loss, night sweats, loss of appetite, or other constitutional symptoms  Plays golf 3 times weekly.      Wt Readings from Last 3 Encounters:  05/31/23 132 lb (59.9 kg)  02/15/23 128 lb (58.1 kg)  02/09/23 134 lb (60.8 kg)   BP Readings from Last 3 Encounters:  05/31/23 118/72  02/15/23 138/80  02/09/23 128/76         Past Medical History:  Diagnosis Date   Aortic atherosclerosis ([facility]) 06/21/2022   Arthritis    Atrial fibrillation ([facility])    post MI-amiodarone stopped 07/2008   Benign prostatic hypertrophy    CAD (coronary artery disease) 07/2008   2 BMS placed for MI, August, 2009   Carotid artery disease without cerebral infarction [facility])    CORONARY ARTERY BYPASS GRAFT, HX OF 07/13/2009   August, 2009, Dr. Laneta Simmers   Ejection fraction    .   History of kidney stones    History of nephrolithiasis    Hx of CABG    Hx of colonoscopy    Hyperlipidemia    HYPERLIPIDEMIA 02/09/2009   Qualifier: Diagnosis of  By: [name] MD, Len Blalock    Increased prostate specific antigen (PSA) velocity    Myocardial infarction ([facility])    NEPHROLITHIASIS, HX OF 02/09/2009   Qualifier: Diagnosis of  By: [name] MD, Len Blalock    Prostatitis    PSA elevation 09/06/2012   Thrombocytopenia [facility])    Past Surgical History:  Procedure Laterality Date   CORONARY ARTERY BYPASS GRAFT  08/05/08   x5, LIMA to the LAD.    CORONARY STENT PLACEMENT  07/2008   2 BMS to RCA   HEMORRHOID SURGERY     HYDROCELE EXCISION     INCISIONAL HERNIA REPAIR  04/16/2006   Left flank incisional hernia repair with mesh.   Dr Philippa Chester HERNIA REPAIR N/A 02/11/2021   Procedure: LAPAROSCOPIC REPAIR OF  INCISIONAL RECURRENT LEFT FLANK HERNIA WITH MESH TAP BLOCK - BILATERAL;  Surgeon: Karie Soda, MD;  Location: [facility];  Service: General;  Laterality: N/A;   left leg broken     PROSTATE BIOPSY  2007   neg   ROTATOR CUFF REPAIR     bilateral; Dr. Thurston Hole    reports that he has never smoked. He has never used smokeless tobacco. He reports that he does not drink alcohol and does not use drugs. family history includes Healthy in his brother; Leukemia in his father; Lung cancer in his mother. Allergies  Allergen Reactions   Augmentin [Amoxicillin-Pot Clavulanate] Other (See Comments)    C diff diarrhea   Niacin Itching    Leg aches and itching   Current Outpatient Medications on File Prior to Visit  Medication Sig Dispense Refill   acetaminophen (TYLENOL) 500 MG tablet Take 1,000 mg by mouth every 6 (six) hours as needed for mild pain.     aspirin 81 MG EC tablet Take 1 tablet (81 mg total) by mouth daily. Swallow  whole. 30 tablet 12   Cholecalciferol (VITAMIN D) 50 MCG (2000 UT) CAPS Take 2,000 Units by mouth daily.     clobetasol (OLUX) 0.05 % topical foam Apply topically 2 (two) times daily. 50 g 5   Fluorouracil (TOLAK) 4 % CREA Apply to affected area nightly x 28 applications. 40 g 0   levocetirizine (XYZAL) 5 MG tablet levocetirizine 5 mg tablet     methocarbamol (ROBAXIN) 500 MG tablet      Multiple Vitamin (MULTIVITAMIN) tablet Take 1 tablet by mouth daily.     No current facility-administered medications on file prior to visit.        ROS:  All others reviewed and negative.  Objective        PE:  BP 118/72 (BP Location: Right Arm, Patient Position: Sitting, Cuff Size: Normal)   Pulse (!) 55   Temp 98.1 F (36.7 C) (Oral)   Ht 5\' 4"  (1.626 m)   Wt 132 lb (59.9 kg)   SpO2 99%   BMI 22.66 kg/m                 Constitutional: Pt appears in NAD               HENT: Head: NCAT.                 Right Ear: External ear normal.                 Left Ear: External ear normal.                Eyes: . Pupils are equal, round, and reactive to light. Conjunctivae and EOM are normal               Nose: without d/c or deformity               Neck: Neck supple. Gross normal ROM               Cardiovascular: Normal rate and regular rhythm.                 Pulmonary/Chest: Effort normal and breath sounds without rales or wheezing.                Abd:  Soft, NT, ND, + BS, no organomegaly               Neurological: Pt is alert. At baseline orientation, motor grossly intact               Skin: Skin is warm. No rashes, no other new lesions, LE edema - none               Psychiatric: Pt behavior is normal without agitation   Micro: none  Cardiac tracings I have personally interpreted today:  none  Pertinent Radiological findings (summarize): none   Lab Results  Component Value Date   WBC 6.3 05/31/2023   HGB 13.9 05/31/2023   HCT 41.9 05/31/2023   PLT 132.0 (L) 05/31/2023   GLUCOSE 95 05/31/2023   CHOL 119 05/31/2023   TRIG 141.0 05/31/2023   HDL 43.70 05/31/2023   LDLDIRECT 79.0 06/29/2022   LDLCALC 47 05/31/2023   ALT 15 05/31/2023   AST 28 05/31/2023   NA 139 05/31/2023   K 4.5 05/31/2023   CL 102 05/31/2023   CREATININE 1.16 05/31/2023   BUN 16 05/31/2023   CO2 28 05/31/2023   TSH 2.75 05/31/2023   PSA 5.72 (H) 05/31/2023  HGBA1C 6.1 05/31/2023   Assessment/Plan:  [name] is a 82 y.o. White or Caucasian [1] male with  has a past medical history of Aortic atherosclerosis ([facility]) (06/21/2022), Arthritis, Atrial fibrillation ([facility]), Benign prostatic hypertrophy, CAD (coronary artery disease) (07/2008), Carotid artery disease without cerebral infarction [facility]), CORONARY ARTERY BYPASS GRAFT, HX OF (07/13/2009), Ejection fraction, History of kidney stones, History of nephrolithiasis, CABG, colonoscopy, Hyperlipidemia, HYPERLIPIDEMIA (02/09/2009), Increased prostate specific  antigen (PSA) velocity, Myocardial infarction ([facility]), NEPHROLITHIASIS, HX OF (02/09/2009), Prostatitis, PSA elevation (09/06/2012), and Thrombocytopenia ([facility]).  Hyperlipidemia Lab Results  Component Value Date   LDLCALC 47 05/31/2023   Stable, pt to continue current statin zocor 40 mg qd   PSA, INCREASED Lab Results  Component Value Date   PSA 5.72 (H) 05/31/2023   PSA 6.56 (H) 06/29/2021   PSA 7.45 (H) 04/23/2018   Stable, cont to follow  Hyperglycemia Lab Results  Component Value Date   HGBA1C 6.1 05/31/2023   Stable, pt to continue current medical treatment  - diet,wt control   CAD (coronary artery disease) Stable, cont current med tx  Followup: Return in about 1 year (around 05/30/2024).  Oliver Barre, MD 06/02/2023 8:10 PM [facility]  Primary Care - [facility]

## 2023-05-31 NOTE — Patient Instructions (Signed)

## 2023-06-02 ENCOUNTER — Encounter: Payer: Self-pay | Admitting: Internal Medicine

## 2023-06-02 NOTE — Assessment & Plan Note (Signed)
Lab Results  Component Value Date   PSA 5.72 (H) 05/31/2023   PSA 6.56 (H) 06/29/2021   PSA 7.45 (H) 04/23/2018   Stable, cont to follow

## 2023-06-02 NOTE — Assessment & Plan Note (Signed)
Lab Results  Component Value Date   HGBA1C 6.1 05/31/2023   Stable, pt to continue current medical treatment  - diet,wt control

## 2023-06-02 NOTE — Assessment & Plan Note (Signed)
Lab Results  Component Value Date   LDLCALC 47 05/31/2023   Stable, pt to continue current statin zocor 40 mg qd

## 2023-06-02 NOTE — Assessment & Plan Note (Signed)
Stable, cont current med tx 

## 2023-06-20 DIAGNOSIS — H2512 Age-related nuclear cataract, left eye: Secondary | ICD-10-CM | POA: Diagnosis not present

## 2023-06-20 DIAGNOSIS — H10413 Chronic giant papillary conjunctivitis, bilateral: Secondary | ICD-10-CM | POA: Diagnosis not present

## 2023-06-20 DIAGNOSIS — H04123 Dry eye syndrome of bilateral lacrimal glands: Secondary | ICD-10-CM | POA: Diagnosis not present

## 2023-07-11 ENCOUNTER — Encounter (INDEPENDENT_AMBULATORY_CARE_PROVIDER_SITE_OTHER): Payer: Self-pay

## 2023-07-26 ENCOUNTER — Ambulatory Visit (INDEPENDENT_AMBULATORY_CARE_PROVIDER_SITE_OTHER): Payer: Medicare Other

## 2023-07-26 VITALS — Ht 63.0 in | Wt 132.0 lb

## 2023-07-26 DIAGNOSIS — Z0000 Encounter for general adult medical examination without abnormal findings: Secondary | ICD-10-CM | POA: Diagnosis not present

## 2023-07-26 NOTE — Patient Instructions (Signed)
[name] , Thank you for taking time to come for your Medicare Wellness Visit. I appreciate your ongoing commitment to your health goals. Please review the following plan we discussed and let me know if I can assist you in the future.   Referrals/Orders/Follow-Ups/Clinician Recommendations: It was nice speaking with you today.  Remember to soon get your Flu vaccine and you are due for a Tetanus vaccine as well.  Have that discussion with Dr. Jonny [name] about the Cologuard during your next office visit with him (and maybe the Shingles vaccine).  Keep up the good work.  This is a list of the screening recommended for you and due dates:  Health Maintenance  Topic Date Due   Zoster (Shingles) Vaccine (1 of 2) 02/15/1961   DTaP/Tdap/Td vaccine (2 - Tdap) 02/10/2019   Flu Shot  07/12/2023   Colon Cancer Screening  10/10/2023   Medicare Annual Wellness Visit  07/25/2024   Pneumonia Vaccine  Completed   HPV Vaccine  Aged Out   COVID-19 Vaccine  Discontinued   Hepatitis C Screening  Discontinued    Advanced directives: (Copy Requested) Please bring a copy of your health care power of attorney and living will to the office to be added to your chart at your convenience.  Next Medicare Annual Wellness Visit scheduled for next year: Yes  Preventive Care 81 Years and Older, Male  Preventive care refers to lifestyle choices and visits with your health care provider that can promote health and wellness. What does preventive care include? A yearly physical exam. This is also called an annual well check. Dental exams once or twice a year. Routine eye exams. Ask your health care provider how often you should have your eyes checked. Personal lifestyle choices, including: Daily care of your teeth and gums. Regular physical activity. Eating a healthy diet. Avoiding tobacco and drug use. Limiting alcohol use. Practicing safe sex. Taking low doses of aspirin every day. Taking vitamin and mineral supplements  as recommended by your health care provider. What happens during an annual well check? The services and screenings done by your health care provider during your annual well check will depend on your age, overall health, lifestyle risk factors, and family history of disease. Counseling  Your health care provider may ask you questions about your: Alcohol use. Tobacco use. Drug use. Emotional well-being. Home and relationship well-being. Sexual activity. Eating habits. History of falls. Memory and ability to understand (cognition). Work and work Astronomer. Screening  You may have the following tests or measurements: Height, weight, and BMI. Blood pressure. Lipid and cholesterol levels. These may be checked every 5 years, or more frequently if you are over 17 years old. Skin check. Lung cancer screening. You may have this screening every year starting at age 81 if you have a 30-pack-year history of smoking and currently smoke or have quit within the past 15 years. Fecal occult blood test (FOBT) of the stool. You may have this test every year starting at age 81. Flexible sigmoidoscopy or colonoscopy. You may have a sigmoidoscopy every 5 years or a colonoscopy every 10 years starting at age 81. Prostate cancer screening. Recommendations will vary depending on your family history and other risks. Hepatitis C blood test. Hepatitis B blood test. Sexually transmitted disease (STD) testing. Diabetes screening. This is done by checking your blood sugar (glucose) after you have not eaten for a while (fasting). You may have this done every 1-3 years. Abdominal aortic aneurysm (AAA) screening. You may need  this if you are a current or former smoker. Osteoporosis. You may be screened starting at age 81 if you are at high risk. Talk with your health care provider about your test results, treatment options, and if necessary, the need for more tests. Vaccines  Your health care provider may recommend  certain vaccines, such as: Influenza vaccine. This is recommended every year. Tetanus, diphtheria, and acellular pertussis (Tdap, Td) vaccine. You may need a Td booster every 10 years. Zoster vaccine. You may need this after age 20. Pneumococcal 13-valent conjugate (PCV13) vaccine. One dose is recommended after age 19. Pneumococcal polysaccharide (PPSV23) vaccine. One dose is recommended after age 81. Talk to your health care provider about which screenings and vaccines you need and how often you need them. This information is not intended to replace advice given to you by your health care provider. Make sure you discuss any questions you have with your health care provider. Document Released: 12/24/2015 Document Revised: 08/16/2016 Document Reviewed: 09/28/2015 Elsevier Interactive Patient Education  2017 [employer].  Fall Prevention in the Home Falls can cause injuries. They can happen to people of all ages. There are many things you can do to make your home safe and to help prevent falls. What can I do on the outside of my home? Regularly fix the edges of walkways and driveways and fix any cracks. Remove anything that might make you trip as you walk through a door, such as a raised step or threshold. Trim any bushes or trees on the path to your home. Use bright outdoor lighting. Clear any walking paths of anything that might make someone trip, such as rocks or tools. Regularly check to see if handrails are loose or broken. Make sure that both sides of any steps have handrails. Any raised decks and porches should have guardrails on the edges. Have any leaves, snow, or ice cleared regularly. Use sand or salt on walking paths during winter. Clean up any spills in your garage right away. This includes oil or grease spills. What can I do in the bathroom? Use night lights. Install grab bars by the toilet and in the tub and shower. Do not use towel bars as grab bars. Use non-skid mats or  decals in the tub or shower. If you need to sit down in the shower, use a plastic, non-slip stool. Keep the floor dry. Clean up any water that spills on the floor as soon as it happens. Remove soap buildup in the tub or shower regularly. Attach bath mats securely with double-sided non-slip rug tape. Do not have throw rugs and other things on the floor that can make you trip. What can I do in the bedroom? Use night lights. Make sure that you have a light by your bed that is easy to reach. Do not use any sheets or blankets that are too big for your bed. They should not hang down onto the floor. Have a firm chair that has side arms. You can use this for support while you get dressed. Do not have throw rugs and other things on the floor that can make you trip. What can I do in the kitchen? Clean up any spills right away. Avoid walking on wet floors. Keep items that you use a lot in easy-to-reach places. If you need to reach something above you, use a strong step stool that has a grab bar. Keep electrical cords out of the way. Do not use floor polish or wax that makes floors  slippery. If you must use wax, use non-skid floor wax. Do not have throw rugs and other things on the floor that can make you trip. What can I do with my stairs? Do not leave any items on the stairs. Make sure that there are handrails on both sides of the stairs and use them. Fix handrails that are broken or loose. Make sure that handrails are as long as the stairways. Check any carpeting to make sure that it is firmly attached to the stairs. Fix any carpet that is loose or worn. Avoid having throw rugs at the top or bottom of the stairs. If you do have throw rugs, attach them to the floor with carpet tape. Make sure that you have a light switch at the top of the stairs and the bottom of the stairs. If you do not have them, ask someone to add them for you. What else can I do to help prevent falls? Wear shoes that: Do not  have high heels. Have rubber bottoms. Are comfortable and fit you well. Are closed at the toe. Do not wear sandals. If you use a stepladder: Make sure that it is fully opened. Do not climb a closed stepladder. Make sure that both sides of the stepladder are locked into place. Ask someone to hold it for you, if possible. Clearly mark and make sure that you can see: Any grab bars or handrails. First and last steps. Where the edge of each step is. Use tools that help you move around (mobility aids) if they are needed. These include: Canes. Walkers. Scooters. Crutches. Turn on the lights when you go into a dark area. Replace any light bulbs as soon as they burn out. Set up your furniture so you have a clear path. Avoid moving your furniture around. If any of your floors are uneven, fix them. If there are any pets around you, be aware of where they are. Review your medicines with your doctor. Some medicines can make you feel dizzy. This can increase your chance of falling. Ask your doctor what other things that you can do to help prevent falls. This information is not intended to replace advice given to you by your health care provider. Make sure you discuss any questions you have with your health care provider. Document Released: 09/23/2009 Document Revised: 05/04/2016 Document Reviewed: 01/01/2015 Elsevier Interactive Patient Education  2017 [employer].

## 2023-07-26 NOTE — Progress Notes (Signed)
Subjective:   [name] is a 82 y.o. male who presents for an Initial Medicare Annual Wellness Visit.  Visit Complete: Virtual  I connected with  [name] on 07/26/23 by a audio enabled telemedicine application and verified that I am speaking with the correct person using two identifiers.  Patient Location: Home  Provider Location: Office/Clinic  I discussed the limitations of evaluation and management by telemedicine. The patient expressed understanding and agreed to proceed.  Vital Signs: Vital signs are patient reported.   Review of Systems    Cardiac Risk Factors include: advanced age (>4men, >32 women);male gender     Objective:    Today's Vitals   07/26/23 0818  Weight: 132 lb (59.9 kg)  Height: 5\' 3"  (1.6 m)   Body mass index is 23.38 kg/m.     07/26/2023    8:22 AM 09/04/2021   12:19 AM 08/28/2021   10:01 AM 02/11/2021    1:00 PM 02/04/2021   11:16 AM 01/04/2021   10:23 AM  Advanced Directives  Does Patient Have a Medical Advance Directive? Yes No No No No No  Type of Estate agent of [place];Living will       Copy of Healthcare Power of Attorney in Chart? No - copy requested       Would patient like information on creating a medical advance directive?   No - Patient declined No - Patient declined  No - Patient declined    Current Medications (verified) Outpatient Encounter Medications as of 07/26/2023  Medication Sig   acetaminophen (TYLENOL) 500 MG tablet Take 1,000 mg by mouth every 6 (six) hours as needed for mild pain.   aspirin 81 MG EC tablet Take 1 tablet (81 mg total) by mouth daily. Swallow whole.   Cholecalciferol (VITAMIN D) 50 MCG (2000 UT) CAPS Take 2,000 Units by mouth daily.   clobetasol (OLUX) 0.05 % topical foam Apply topically 2 (two) times daily.   Fluorouracil (TOLAK) 4 % CREA Apply to affected area nightly x 28 applications.   levocetirizine (XYZAL) 5 MG tablet levocetirizine 5 mg tablet   methocarbamol  (ROBAXIN) 500 MG tablet    Multiple Vitamin (MULTIVITAMIN) tablet Take 1 tablet by mouth daily.   PARoxetine (PAXIL) 10 MG tablet Take 1 tablet (10 mg total) by mouth daily.   simvastatin (ZOCOR) 40 MG tablet TAKE 1/2 (ONE-HALF) TABLET BY MOUTH AT BEDTIME   No facility-administered encounter medications on file as of 07/26/2023.    Allergies (verified) Augmentin [amoxicillin-pot clavulanate] and Niacin   History: Past Medical History:  Diagnosis Date   Aortic atherosclerosis (HCC) 06/21/2022   Arthritis    Atrial fibrillation (HCC)    post MI-amiodarone stopped 07/2008   Benign prostatic hypertrophy    CAD (coronary artery disease) 07/2008   2 BMS placed for MI, August, 2009   Carotid artery disease without cerebral infarction [facility])    CORONARY ARTERY BYPASS GRAFT, HX OF 07/13/2009   August, 2009, Dr. Laneta Simmers   Ejection fraction    .   History of kidney stones    History of nephrolithiasis    Hx of CABG    Hx of colonoscopy    Hyperlipidemia    HYPERLIPIDEMIA 02/09/2009   Qualifier: Diagnosis of  By: [name] MD, Len Blalock    Increased prostate specific antigen (PSA) velocity    Myocardial infarction (HCC)    NEPHROLITHIASIS, HX OF 02/09/2009   Qualifier: Diagnosis of  By: [name] MD, Len Blalock  Prostatitis    PSA elevation 09/06/2012   Thrombocytopenia [facility])    Past Surgical History:  Procedure Laterality Date   CORONARY ARTERY BYPASS GRAFT  08/05/08   x5, LIMA to the LAD.    CORONARY STENT PLACEMENT  07/2008   2 BMS to RCA   HEMORRHOID SURGERY     HYDROCELE EXCISION     INCISIONAL HERNIA REPAIR  04/16/2006   Left flank incisional hernia repair with mesh.  Dr Philippa Chester HERNIA REPAIR N/A 02/11/2021   Procedure: LAPAROSCOPIC REPAIR OF  INCISIONAL RECURRENT LEFT FLANK HERNIA WITH MESH TAP BLOCK - BILATERAL;  Surgeon: Karie Soda, MD;  Location: [facility];  Service: General;  Laterality: N/A;   left leg broken     PROSTATE BIOPSY  2007   neg   ROTATOR CUFF REPAIR     bilateral;  Dr. Thurston Hole   Family History  Problem Relation Age of Onset   Lung cancer Mother    Leukemia Father    Healthy Brother    Colon cancer Neg Hx    Rectal cancer Neg Hx    Stomach cancer Neg Hx    Social History   Socioeconomic History   Marital status: Married    Spouse name: [name]   Number of children: 1   Years of education: Not on file   Highest education level: Not on file  Occupational History   Occupation: Retired  Tobacco Use   Smoking status: Never   Smokeless tobacco: Never  Vaping Use   Vaping status: Never Used  Substance and Sexual Activity   Alcohol use: No   Drug use: No   Sexual activity: Not Currently  Other Topics Concern   Not on file  Social History Narrative   Not on file   Social Determinants of Health   Financial Resource Strain: Not on file  Food Insecurity: Not on file  Transportation Needs: Not on file  Physical Activity: Not on file  Stress: Not on file  Social Connections: Not on file    Tobacco Counseling Counseling given: Not Answered   Clinical Intake:  Pre-visit preparation completed: Yes  Pain : No/denies pain     BMI - recorded: 23.38 Nutritional Status: BMI of 19-24  Normal Nutritional Risks: None Diabetes: No  How often do you need to have someone help you when you read instructions, pamphlets, or other written materials from your doctor or pharmacy?: 1 - Never  Interpreter Needed?: No  Information entered by :: Rhylin Venters, RMA   Activities of Daily Living    07/26/2023    8:20 AM  In your present state of health, do you have any difficulty performing the following activities:  Hearing? 0  Vision? 0  Difficulty concentrating or making decisions? 0  Walking or climbing stairs? 0  Dressing or bathing? 0  Doing errands, shopping? 0  Preparing Food and eating ? N  Using the Toilet? N  In the past six months, have you accidently leaked urine? N  Do you have problems with loss of bowel control? N   Managing your Medications? N  Managing your Finances? N  Housekeeping or managing your Housekeeping? N    Patient Care Team: Corwin Levins, MD as PCP - General Jens Som Madolyn Frieze, MD as PCP - Cardiology (Cardiology) Karie Soda, MD as Consulting Physician (General Surgery) Bjorn Pippin, MD as Attending Physician (Urology) Luis Abed, MD (Cardiology) Janalyn Harder, MD (Inactive) as Consulting Physician (Dermatology)  Indicate any recent  Medical Services you may have received from other than [facility] providers in the past year (date may be approximate).     Assessment:   This is a routine wellness examination for [place].  Hearing/Vision screen Hearing Screening - Comments:: Denies hearing difficulties   Vision Screening - Comments:: Wears eyeglasses  Dietary issues and exercise activities discussed:     Goals Addressed               This Visit's Progress     Patient Stated (pt-stated)        Staying healthy      Depression Screen    05/31/2023    1:14 PM 02/09/2023    3:01 PM 06/29/2022   10:08 AM 06/29/2022    9:42 AM 06/21/2022    1:13 PM 12/30/2021   10:00 AM 06/29/2021    9:47 AM  PHQ 2/9 Scores  PHQ - 2 Score 0 0 0 0 0 0 1  PHQ- 9 Score  0  0       Fall Risk    07/26/2023    8:23 AM 05/31/2023    1:14 PM 02/09/2023    3:01 PM 06/29/2022   10:08 AM 06/29/2022    9:42 AM  Fall Risk   Falls in the past year? 0 0 0 0 0  Number falls in past yr: 0 0 0 0 0  Injury with Fall? 0 0 0 0 0  Risk for fall due to : No Fall Risks No Fall Risks No Fall Risks    Follow up Falls prevention discussed;Falls evaluation completed Falls evaluation completed Education provided;Falls evaluation completed      MEDICARE RISK AT HOME:  Medicare Risk at Home - 07/26/23 0823     Any stairs in or around the home? No    Home free of loose throw rugs in walkways, pet beds, electrical cords, etc? Yes    Adequate lighting in your home to reduce risk of falls? Yes    Life alert? No     Use of a cane, walker or w/c? No    Grab bars in the bathroom? No    Shower chair or bench in shower? No    Elevated toilet seat or a handicapped toilet? No             TIMED UP AND GO:  Was the test performed? No    Cognitive Function:        07/26/2023    8:24 AM  6CIT Screen  What Year? 0 points  What month? 0 points  What time? 0 points    Immunizations Immunization History  Administered Date(s) Administered   Fluad Quad(high Dose 65+) 08/14/2019   Influenza, High Dose Seasonal PF 10/04/2017, 08/30/2018   Influenza,inj,Quad PF,6+ Mos 09/12/2013, 01/14/2015   PFIZER(Purple Top)SARS-COV-2 Vaccination 02/08/2020, 03/09/2020   Pneumococcal Conjugate-13 09/12/2013   Pneumococcal Polysaccharide-23 08/31/2008   Td 02/09/2009   Zoster, Live 02/09/2009    TDAP status: Due, Education has been provided regarding the importance of this vaccine. Advised may receive this vaccine at local pharmacy or Health Dept. Aware to provide a copy of the vaccination record if obtained from local pharmacy or Health Dept. Verbalized acceptance and understanding.  Flu Vaccine status: Due, Education has been provided regarding the importance of this vaccine. Advised may receive this vaccine at local pharmacy or Health Dept. Aware to provide a copy of the vaccination record if obtained from local pharmacy or Health Dept. Verbalized acceptance and understanding.  Pneumococcal vaccine status: Up to date  Covid-19 vaccine status: Declined, Education has been provided regarding the importance of this vaccine but patient still declined. Advised may receive this vaccine at local pharmacy or Health Dept.or vaccine clinic. Aware to provide a copy of the vaccination record if obtained from local pharmacy or Health Dept. Verbalized acceptance and understanding.  Qualifies for Shingles Vaccine? Yes   Zostavax completed Yes   Shingrix Completed?: No.    Education has been provided regarding the  importance of this vaccine. Patient has been advised to call insurance company to determine out of pocket expense if they have not yet received this vaccine. Advised may also receive vaccine at local pharmacy or Health Dept. Verbalized acceptance and understanding.  Screening Tests Health Maintenance  Topic Date Due   Zoster Vaccines- Shingrix (1 of 2) 02[birth year]15/1960   DTaP/Tdap/Td (2 - Tdap) 02/10/2019   INFLUENZA VACCINE  07/12/2023   Colonoscopy  10/10/2023   Medicare Annual Wellness (AWV)  07/25/2024   Pneumonia Vaccine 44+ Years old  Completed   HPV VACCINES  Aged Out   COVID-19 Vaccine  Discontinued   Hepatitis C Screening  Discontinued    Health Maintenance  Health Maintenance Due  Topic Date Due   Zoster Vaccines- Shingrix (1 of 2) 02[birth year]15/1960   DTaP/Tdap/Td (2 - Tdap) 02/10/2019   INFLUENZA VACCINE  07/12/2023   Colonoscopy  10/10/2023    Colorectal cancer screening: Type of screening: Colonoscopy. Completed 10/31/2018. Repeat every 5 years  Lung Cancer Screening: (Low Dose CT Chest recommended if Age 26-80 years, 20 pack-year currently smoking OR have quit w/in 15years.) does not qualify.   Lung Cancer Screening Referral: N/A  Additional Screening:  Hepatitis C Screening: does qualify; Completed 07/26/2009  Vision Screening: Recommended annual ophthalmology exams for early detection of glaucoma and other disorders of the eye. Is the patient up to date with their annual eye exam?  Yes  Who is the provider or what is the name of the office in which the patient attends annual eye exams? Dr. Randon Goldsmith If pt is not established with a provider, would they like to be referred to a provider to establish care? No .   Dental Screening: Recommended annual dental exams for proper oral hygiene   Community Resource Referral / Chronic Care Management: CRR required this visit?  No   CCM required this visit?  No    Plan:     I have personally reviewed and noted the following in  the patient's chart:   Medical and social history Use of alcohol, tobacco or illicit drugs  Current medications and supplements including opioid prescriptions. Patient is not currently taking opioid prescriptions. Functional ability and status Nutritional status Physical activity Advanced directives List of other physicians Hospitalizations, surgeries, and ER visits in previous 12 months Vitals Screenings to include cognitive, depression, and falls Referrals and appointments  In addition, I have reviewed and discussed with patient certain preventive protocols, quality metrics, and best practice recommendations. A written personalized care plan for preventive services as well as general preventive health recommendations were provided to patient.     Giannie Soliday L Zaylia Riolo, CMA   07/26/2023   After Visit Summary: (MyChart) Due to this being a telephonic visit, the after visit summary with patients personalized plan was offered to patient via MyChart   Nurse Notes: Patient is due for Flu, Tdap, and 2nd Shingrix vaccines.  He is also due for a colonoscopy this year.  Patient is interested in and  would  like to discuss the Cologuard with Dr. [name].  He has no other concerns today.

## 2023-08-31 ENCOUNTER — Ambulatory Visit (INDEPENDENT_AMBULATORY_CARE_PROVIDER_SITE_OTHER): Payer: Medicare Other | Admitting: Nurse Practitioner

## 2023-08-31 VITALS — BP 118/60 | HR 78 | Temp 98.0°F | Ht 63.0 in | Wt 130.4 lb

## 2023-08-31 DIAGNOSIS — U071 COVID-19: Secondary | ICD-10-CM | POA: Diagnosis not present

## 2023-08-31 DIAGNOSIS — H6690 Otitis media, unspecified, unspecified ear: Secondary | ICD-10-CM | POA: Diagnosis not present

## 2023-08-31 LAB — POCT RESPIRATORY SYNCYTIAL VIRUS: RSV Rapid Ag: NEGATIVE

## 2023-08-31 LAB — POC COVID19 BINAXNOW: SARS Coronavirus 2 Ag: POSITIVE — AB

## 2023-08-31 LAB — POCT INFLUENZA A/B
Influenza A, POC: NEGATIVE
Influenza B, POC: NEGATIVE

## 2023-08-31 MED ORDER — NIRMATRELVIR/RITONAVIR (PAXLOVID) TABLET (RENAL DOSING)
2.0000 | ORAL_TABLET | Freq: Two times a day (BID) | ORAL | 0 refills | Status: AC
Start: 2023-08-31 — End: 2023-09-05

## 2023-08-31 MED ORDER — DOXYCYCLINE HYCLATE 100 MG PO TABS: 100.0000 mg | ORAL_TABLET | Freq: Two times a day (BID) | ORAL | 0 refills | Status: DC

## 2023-08-31 NOTE — Assessment & Plan Note (Signed)
Acute Amoxicillin contraindicated per chart review Treat with Doxycycline 100mg  BID x 5 days Tylenol as needed for pain/fever F/U if symptoms persist or worsen

## 2023-08-31 NOTE — Progress Notes (Signed)
Established Patient Office Visit  Subjective   Patient ID: VAIBHAV MORGANELLI, male    DOB: January 06, 1941  Age: 82 y.o. MRN: [id number]  Chief Complaint  Patient presents with   fever, ear pain    Symptom onset last night.  Having right ear pain, chills, fever as high as 102, and headache.  Recently traveled to beach prior to symptom onset.  Has been treating with Tylenol as needed.    Review of Systems  Constitutional:  Positive for chills and fever.  HENT:  Negative for sore throat.   Respiratory:  Negative for cough, shortness of breath and wheezing.   Cardiovascular:  Negative for chest pain.  Neurological:  Positive for headaches.      Objective:     BP 118/60   Pulse 78   Temp 98 F (36.7 C) (Temporal)   Ht 5\' 3"  (1.6 m)   Wt 130 lb 6 oz (59.1 kg)   SpO2 98%   BMI 23.09 kg/m    Physical Exam Vitals reviewed.  Constitutional:      Appearance: Normal appearance.  HENT:     Head: Normocephalic and atraumatic.     Right Ear: Hearing, ear canal and external ear normal. Tympanic membrane is erythematous.     Left Ear: Tympanic membrane normal.  Cardiovascular:     Rate and Rhythm: Normal rate and regular rhythm.  Pulmonary:     Effort: Pulmonary effort is normal.     Breath sounds: Normal breath sounds.  Musculoskeletal:     Cervical back: Neck supple.  Skin:    General: Skin is warm and dry.  Neurological:     Mental Status: He is alert and oriented to person, place, and time.  Psychiatric:        Mood and Affect: Mood normal.        Behavior: Behavior normal.        Thought Content: Thought content normal.        Judgment: Judgment normal.      Results for orders placed or performed in visit on 08/31/23  POC COVID-19  Result Value Ref Range   SARS Coronavirus 2 Ag Positive (A) Negative  POCT Influenza A/B  Result Value Ref Range   Influenza A, POC Negative Negative   Influenza B, POC Negative Negative  POCT respiratory syncytial virus  Result  Value Ref Range   RSV Rapid Ag negative       The ASCVD Risk score (Arnett DK, et al., 2019) failed to calculate for the following reasons:   The 2019 ASCVD risk score is only valid for ages 39 to 3    Assessment & Plan:   Problem List Items Addressed This Visit       Nervous and Auditory   Acute otitis media - Primary    Acute Amoxicillin contraindicated per chart review Treat with Doxycycline 100mg  BID x 5 days Tylenol as needed for pain/fever F/U if symptoms persist or worsen      Relevant Medications   doxycycline (VIBRA-TABS) 100 MG tablet   nirmatrelvir/ritonavir, renal dosing, (PAXLOVID) 10 x 150 MG & 10 x 100MG  TABS     Other   COVID-19    Acute POC covid test positive Prescribe the renal-dose paxlovid, take as prescribed x 5 days, hold simvastatin until paxlovid course completed. Patient is uncertain if he wants to take the paxlovid. He was educated on risk and benefit of taking the medication and that if he were to take it  he needs to start it by day 5 of symptom onset. He reports understanding.  Isolate at home until symptoms improve, wear a mask if having to go out in public for 10 days.  Proceed to ER if symptoms worsen, f/u in person for persistent symptoms.       Relevant Medications   nirmatrelvir/ritonavir, renal dosing, (PAXLOVID) 10 x 150 MG & 10 x 100MG  TABS   Other Relevant Orders   POC COVID-19 (Completed)   POCT Influenza A/B (Completed)   POCT respiratory syncytial virus (Completed)    Return if symptoms worsen or fail to improve.    Elenore Paddy, NP

## 2023-08-31 NOTE — Assessment & Plan Note (Addendum)
Acute POC covid test positive Prescribe the renal-dose paxlovid, take as prescribed x 5 days, hold simvastatin until paxlovid course completed. Patient is uncertain if he wants to take the paxlovid. He was educated on risk and benefit of taking the medication and that if he were to take it he needs to start it by day 5 of symptom onset. He reports understanding.  Isolate at home until symptoms improve, wear a mask if having to go out in public for 10 days.  Proceed to ER if symptoms worsen, f/u in person for persistent symptoms.

## 2023-08-31 NOTE — Patient Instructions (Signed)
If you start the Paxlovid then DO NOT take simvastatin while taking the Paxlovid

## 2024-02-05 ENCOUNTER — Encounter: Payer: Self-pay | Admitting: Family Medicine

## 2024-02-05 ENCOUNTER — Ambulatory Visit (INDEPENDENT_AMBULATORY_CARE_PROVIDER_SITE_OTHER): Payer: Medicare Other

## 2024-02-05 ENCOUNTER — Ambulatory Visit (INDEPENDENT_AMBULATORY_CARE_PROVIDER_SITE_OTHER): Payer: Medicare Other | Admitting: Family Medicine

## 2024-02-05 ENCOUNTER — Other Ambulatory Visit: Payer: Self-pay

## 2024-02-05 VITALS — BP 130/82 | HR 61 | Ht 63.0 in | Wt 131.0 lb

## 2024-02-05 DIAGNOSIS — M4316 Spondylolisthesis, lumbar region: Secondary | ICD-10-CM | POA: Diagnosis not present

## 2024-02-05 DIAGNOSIS — M545 Low back pain, unspecified: Secondary | ICD-10-CM | POA: Diagnosis not present

## 2024-02-05 DIAGNOSIS — M5116 Intervertebral disc disorders with radiculopathy, lumbar region: Secondary | ICD-10-CM | POA: Diagnosis not present

## 2024-02-05 DIAGNOSIS — I878 Other specified disorders of veins: Secondary | ICD-10-CM | POA: Diagnosis not present

## 2024-02-05 DIAGNOSIS — M4807 Spinal stenosis, lumbosacral region: Secondary | ICD-10-CM | POA: Diagnosis not present

## 2024-02-05 DIAGNOSIS — M25551 Pain in right hip: Secondary | ICD-10-CM

## 2024-02-05 DIAGNOSIS — M5117 Intervertebral disc disorders with radiculopathy, lumbosacral region: Secondary | ICD-10-CM | POA: Diagnosis not present

## 2024-02-05 DIAGNOSIS — M25559 Pain in unspecified hip: Secondary | ICD-10-CM | POA: Diagnosis not present

## 2024-02-05 NOTE — Patient Instructions (Signed)
 Thank you for coming in today.   Please get an Xray today before you leave

## 2024-02-05 NOTE — Progress Notes (Signed)
   I, Stevenson Clinch, CMA acting as a scribe for [name] Graham, MD.  [name] is a 83 y.o. male who presents to [facility] at [facility] today for R hip pain. Pt was previously seen by Dr. Denyse Amass in 2022 for flank pain.   Today, pt c/o R hip pain x several months, worsening after playing golf yesterday. Describes pain as sharp and debilitating, short-lasting. Pt locates pain to right gluteal region. Denies sharp shooting pain into the leg. Denies weakness. Notes tightness in the hamstrings. Some lower back pain.   Radiates: gluteal region Aggravates: activity such as golfing, sitting.yard work Treatments tried: ice  Pertinent review of systems: No fevers or chills  Relevant historical information: Atrial fibrillation.  History of a irritated costal nerve from a hernia repair   Exam:  BP 130/82   Pulse 61   Ht 5\' 3"  (1.6 m)   Wt 131 lb (59.4 kg)   SpO2 100%   BMI 23.21 kg/m  General: Well Developed, well nourished, and in no acute distress.   MSK: Right hip normal-appearing Tender palpation right buttocks.  Not particularly tender at greater trochanter or ischial tuberosity.  Tender in the piriformis area.  Normal hip motion. Hip abduction and external rotation strength are diminished 4/5.    Lab and Radiology Results  X-ray images lumbar spine and pelvis obtained today personally and independently interpreted.  Lumbar spine: No acute fractures.  Degenerative changes present at L5-S1 facet joints.  Pelvis: No acute fractures.  No severe DJD.  Await formal radiology review    Assessment and Plan: 83 y.o. male with right buttocks pain ongoing for months.  Pain located around the gluteus medius and minimus as well as the piriformis and other hip rotator muscles.  He is a good candidate for physical therapy.  Plan to refer to PT and recheck in about 6 weeks.  If not improved consider MRI or injection.   PDMP not reviewed this encounter. Orders  Placed This Encounter  Procedures   DG Lumbar Spine 2-3 Views    Standing Status:   Future    Number of Occurrences:   1    Expiration Date:   03/04/2024    Reason for Exam (SYMPTOM  OR DIAGNOSIS REQUIRED):   lumbar radiculopathy    Preferred imaging location?:   [facility]   DG Pelvis 1-2 Views    Standing Status:   Future    Number of Occurrences:   1    Expiration Date:   02/04/2025    Reason for Exam (SYMPTOM  OR DIAGNOSIS REQUIRED):   gluteal pain    Preferred imaging location?:    [facility]   Ambulatory referral to Physical Therapy    Referral Priority:   Routine    Referral Type:   Physical Medicine    Referral Reason:   Specialty Services Required    Requested Specialty:   Physical Therapy    Number of Visits Requested:   1   No orders of the defined types were placed in this encounter.    Discussed warning signs or symptoms. Please see discharge instructions. Patient expresses understanding.   The above documentation has been reviewed and is accurate and complete [name], M.D.

## 2024-02-21 ENCOUNTER — Telehealth: Payer: Self-pay | Admitting: Family Medicine

## 2024-02-21 NOTE — Telephone Encounter (Signed)
 Patient would like to know what x ray showed before starting PT.

## 2024-02-22 ENCOUNTER — Encounter: Payer: Self-pay | Admitting: Family Medicine

## 2024-02-22 NOTE — Progress Notes (Signed)
 Lumbar spine x-ray shows chronic pars defects at L5.  You probably had had this your entire life.  This allows the back to move more than it should and can pinch nerves.  It can also cause back pain.  Physical therapy is very helpful for this.  I will make sure your physical therapist that you are going to see on March 20 is aware of this diagnosis as she can help you get better.  You do have some arthritis in your back as well.

## 2024-02-22 NOTE — Progress Notes (Signed)
 Pelvis shows a little bit of hip arthritis.

## 2024-02-27 NOTE — Telephone Encounter (Signed)
 Forwarding to Dr. Denyse Amass to review and advise.

## 2024-02-28 ENCOUNTER — Encounter: Payer: Self-pay | Admitting: Physical Therapy

## 2024-02-28 ENCOUNTER — Ambulatory Visit: Admitting: Physical Therapy

## 2024-02-28 DIAGNOSIS — M25551 Pain in right hip: Secondary | ICD-10-CM

## 2024-02-28 NOTE — Therapy (Signed)
 OUTPATIENT PHYSICAL THERAPY LOWER EXTREMITY EVALUATION   Patient Name: [name] MRN: [id number] DOB:05-14-1941, 83 y.o., male Today's Date: 02/28/2024  END OF SESSION:  PT End of Session - 02/28/24 1458     Visit Number 1    Number of Visits 16    Date for PT Re-Evaluation 04/24/24    Authorization Type Medicare    PT Start Time 1502    PT Stop Time 1555    PT Time Calculation (min) 53 min    Activity Tolerance Patient tolerated treatment well    Behavior During Therapy [facility] for tasks assessed/performed             Past Medical History:  Diagnosis Date   Aortic atherosclerosis (HCC) 06/21/2022   Arthritis    Atrial fibrillation (HCC)    post MI-amiodarone stopped 07/2008   Benign prostatic hypertrophy    CAD (coronary artery disease) 07/2008   2 BMS placed for MI, August, 2009   Carotid artery disease without cerebral infarction [facility])    CORONARY ARTERY BYPASS GRAFT, HX OF 07/13/2009   August, 2009, Dr. Laneta Simmers   Ejection fraction    .   History of kidney stones    History of nephrolithiasis    Hx of CABG    Hx of colonoscopy    Hyperlipidemia    HYPERLIPIDEMIA 02/09/2009   Qualifier: Diagnosis of  By: [name] MD, Len Blalock    Increased prostate specific antigen (PSA) velocity    Myocardial infarction (HCC)    NEPHROLITHIASIS, HX OF 02/09/2009   Qualifier: Diagnosis of  By: [name] MD, Len Blalock    Prostatitis    PSA elevation 09/06/2012   Thrombocytopenia [facility])    Past Surgical History:  Procedure Laterality Date   CORONARY ARTERY BYPASS GRAFT  08/05/08   x5, LIMA to the LAD.    CORONARY STENT PLACEMENT  07/2008   2 BMS to RCA   HEMORRHOID SURGERY     HYDROCELE EXCISION     INCISIONAL HERNIA REPAIR  04/16/2006   Left flank incisional hernia repair with mesh.  Dr Philippa Chester HERNIA REPAIR N/A 02/11/2021   Procedure: LAPAROSCOPIC REPAIR OF  INCISIONAL RECURRENT LEFT FLANK HERNIA WITH MESH TAP BLOCK - BILATERAL;  Surgeon: Karie Soda, MD;  Location: [facility];   Service: General;  Laterality: N/A;   left leg broken     PROSTATE BIOPSY  2007   neg   ROTATOR CUFF REPAIR     bilateral; Dr. Thurston Hole   Patient Active Problem List   Diagnosis Date Noted   Acute otitis media 08/31/2023   COVID-19 08/31/2023   Allergic rhinitis 02/17/2023   Weight loss 02/17/2023   Acute upper respiratory infection 02/09/2023   Cough 11/22/2022   Pulsatile tinnitus of right ear 08/23/2022   Sensorineural hearing loss (SNHL) of both ears 08/23/2022   Aortic atherosclerosis (HCC) 06/21/2022   Foreign body (FB) in soft tissue 11/07/2021   Chronic left flank pain 06/29/2021   Kidney stone 06/08/2021   Constipation, chronic 02/18/2021   Acute urinary retention 02/18/2021   Diverticular disease of colon 02/11/2021   History of colonic polyps 02/11/2021   Colon cancer screening 02/11/2021   Recurrent left flank incisional hernia s/p lap repair w mesh 02/11/2021 02/11/2021   Postcalcaneal bursitis of left foot 04/09/2020   Disorder of left eustachian tube 08/14/2019   Vertigo 12/19/2018   Partial degenerative rupture of right biceps tendon 07/18/2017   Right lateral epicondylitis 07/18/2017   Hyperglycemia  04/18/2017   High foot arch 03/29/2016   Arthritis, midfoot 03/29/2016   Tibialis posterior tendinitis 03/29/2016   Left foot pain 03/28/2016   Mitral regurgitation 09/05/2013   Carotid artery disease without cerebral infarction [facility])    Hx of CABG    Ejection fraction    Preventative health care 08/30/2012   Atrial fibrillation (HCC)    Thrombocytopenia (HCC) 07/26/2009   Headache 07/15/2009   Hyperlipidemia 02/09/2009   Anxiety state 02/09/2009   Depression 02/09/2009   BENIGN PROSTATIC HYPERTROPHY 02/09/2009   PSA, INCREASED 02/09/2009   NEPHROLITHIASIS, HX OF 02/09/2009   CAD (coronary artery disease) 07/11/2008     PCP: Oliver Barre  REFERRING PROVIDER: [name]  REFERRING DIAG: R hip pain  THERAPY DIAG:  Pain in right hip  Rationale for  Evaluation and Treatment: Rehabilitation  ONSET DATE:    SUBJECTIVE:   SUBJECTIVE STATEMENT: Pt states increased pain in R glute for several months. Recently he played golf and reports "couldn't walk" after. He has not had pain in this area before. He usually golfs 3 x/wk. Feels his swing is not the best due to other things hurting in the past, L ankle and L torso.  States most pain in R glute with turning, changing directions, bending forward and playing golf.    PERTINENT HISTORY: CAD, A-fib    PAIN:  Are you having pain? Yes: NPRS scale: up to 10/10 Pain location: R glute  Pain description: very painful,  Aggravating factors: turning, golfing, bending fwd.  Relieving factors: can walk it off   PRECAUTIONS: None  WEIGHT BEARING RESTRICTIONS: No  FALLS:  Has patient fallen in last 6 months? No   PLOF: Independent  PATIENT GOALS:  Decreased pain , keep playing golf   NEXT MD VISIT:   OBJECTIVE:   DIAGNOSTIC FINDINGS:   PATIENT SURVEYS:    COGNITION: Overall cognitive status: Within functional limits for tasks assessed     SENSATION: WFL  EDEMA:    POSTURE:    No Significant postural limitations  PALPATION:   Tightness and soreness in R glute, piriformis,  no pain into gr troch or in lumbar spine.  Stiffness/hypomobile hips bil   LOWER EXTREMITY ROM: Hip flexion: mod limitation bilaterally Hip ER: mod limitation bilaterally Hip IR: mild limitation bilaterally, mild soreness on L.   LOWER EXTREMITY MMT:  MMT Left eval Right  eval  Hip flexion 4 4  Hip extension    Hip abduction 4 4  Hip adduction    Hip internal rotation    Hip external rotation 4 4  Knee flexion    Knee extension 5 5  Ankle dorsiflexion 5 5  Ankle plantarflexion    Ankle inversion    Ankle eversion     (Blank rows = not tested)  LOWER EXTREMITY SPECIAL TESTS:  Mild soreness in FadIR on L;    GAIT:    TODAY'S TREATMENT:  DATE:   02/28/2024 Therapeutic Exercise: Aerobic: Supine: Seated: Standing: Stretches:  hip IR across body 20 sec x 3 on R;   SKTC 30 sec x 3 on R;  Neuromuscular Re-education: Manual Therapy: DTM/TPR to R glute , education for self mfr with tennis ball at wall .  Therapeutic Activity: Self Care: Modalities: moist heat pack to R glute at end of session x10 min    PATIENT EDUCATION:  Education details: PT POC, Exam findings, HEP, use of head and tennis ball for muscle tightness  Person educated: Patient Education method: Explanation, Demonstration, Tactile cues, Verbal cues, and Handouts Education comprehension: verbalized understanding, returned demonstration, verbal cues required, tactile cues required, and needs further education   HOME EXERCISE PROGRAM: Access Code: [email or web address] URL: [email or web address] Date: 02/28/2024 Prepared by: Sedalia Muta  Exercises - Supine Single Knee to Chest Stretch  - 2 x daily - 3 reps - 30 hold - Supine Piriformis Stretch with Leg Straight  - 2 x daily - 3 reps - 20 hold  ASSESSMENT:  CLINICAL IMPRESSION: Patient presents with primary complaint of  pain in R glute. He has stiffness in bil hip joints, as well as pain and tightness in R glute. He has increased pain with bending, turning and transfers. Pt with decreased ability for full functional activities. Pt will  benefit from skilled PT to improve deficits and pain and to return to PLOF.   OBJECTIVE IMPAIRMENTS: decreased activity tolerance, decreased mobility, decreased ROM, decreased strength, increased muscle spasms, impaired flexibility, improper body mechanics, and pain.   ACTIVITY LIMITATIONS: bending, standing, squatting, stairs, transfers, and locomotion level  PARTICIPATION LIMITATIONS: cleaning, driving, shopping, community activity, and yard work  PERSONAL FACTORS: Time since onset  of injury/illness/exacerbation are also affecting patient's functional outcome.   REHAB POTENTIAL: Good  CLINICAL DECISION MAKING: Stable/uncomplicated  EVALUATION COMPLEXITY: Low   GOALS: Goals reviewed with patient? Yes  SHORT TERM GOALS: Target date: 03/20/2024  Pt to be independent with initial HEP  Goal status: INITIAL  2.  Pt to report decreased pain in R glute to 0-5/10 with activity   Goal status: INITIAL     LONG TERM GOALS: Target date: 04/24/2024   Pt to be independent with final HEP  Goal status: INITIAL  2.  Pt to report decreased pain to 0-2/10 with activity and IADLs.   Goal status: INITIAL  3.  Pt to demonstrate ability for safe weight shifts from R to L for improving stability and safety with golf   Goal status: INITIAL  4.  Pt to report ability for stairs, walking, and transfers in/out of car to be pain free, to improve ability for community activity.   Goal status: INITIAL     PLAN:  PT FREQUENCY: 1-2x/week  PT DURATION: 8 weeks  PLANNED INTERVENTIONS: Therapeutic exercises, Therapeutic activity, Neuromuscular re-education, Patient/Family education, Self Care, Joint mobilization, Joint manipulation, Stair training, Orthotic/Fit training, DME instructions, Aquatic Therapy, Dry Needling, Electrical stimulation, Cryotherapy, Moist heat, Taping, Ultrasound, Ionotophoresis 4mg /ml Dexamethasone, Manual therapy,  Vasopneumatic device, Traction, Spinal manipulation, Spinal mobilization,Balance training, Gait training,   PLAN FOR NEXT SESSION: hip mobility, R glute muscle tightness, strength when tolerated.    Sedalia Muta, PT, DPT 3:53 PM  02/28/24

## 2024-02-28 NOTE — Telephone Encounter (Signed)
 X-ray is resulted.  Okay to start PT.

## 2024-03-04 ENCOUNTER — Ambulatory Visit (INDEPENDENT_AMBULATORY_CARE_PROVIDER_SITE_OTHER): Admitting: Physical Therapy

## 2024-03-04 ENCOUNTER — Encounter: Payer: Self-pay | Admitting: Physical Therapy

## 2024-03-04 DIAGNOSIS — M25551 Pain in right hip: Secondary | ICD-10-CM

## 2024-03-04 NOTE — Therapy (Signed)
 OUTPATIENT PHYSICAL THERAPY LOWER EXTREMITY TREATMENT   Patient Name: [name] MRN: [id number] DOB:[birth year], 83 y.o., male Today's Date: 03/04/2024  END OF SESSION:  PT End of Session - 03/04/24 0801     Visit Number 2    Number of Visits 16    Date for PT Re-Evaluation 04/24/24    Authorization Type Medicare    PT Start Time 0802    PT Stop Time 0845    PT Time Calculation (min) 43 min    Activity Tolerance Patient tolerated treatment well    Behavior During Therapy [facility] for tasks assessed/performed             Past Medical History:  Diagnosis Date   Aortic atherosclerosis (HCC) 06/21/2022   Arthritis    Atrial fibrillation (HCC)    post MI-amiodarone stopped 07/2008   Benign prostatic hypertrophy    CAD (coronary artery disease) 07/2008   2 BMS placed for MI, August, 2009   Carotid artery disease without cerebral infarction [facility])    CORONARY ARTERY BYPASS GRAFT, HX OF 07/13/2009   August, 2009, Dr. Laneta Simmers   Ejection fraction    .   History of kidney stones    History of nephrolithiasis    Hx of CABG    Hx of colonoscopy    Hyperlipidemia    HYPERLIPIDEMIA 02/09/2009   Qualifier: Diagnosis of  By: [name] MD, Len Blalock    Increased prostate specific antigen (PSA) velocity    Myocardial infarction (HCC)    NEPHROLITHIASIS, HX OF 02/09/2009   Qualifier: Diagnosis of  By: [name] MD, Len Blalock    Prostatitis    PSA elevation 09/06/2012   Thrombocytopenia [facility])    Past Surgical History:  Procedure Laterality Date   CORONARY ARTERY BYPASS GRAFT  08/05/08   x5, LIMA to the LAD.    CORONARY STENT PLACEMENT  07/2008   2 BMS to RCA   HEMORRHOID SURGERY     HYDROCELE EXCISION     INCISIONAL HERNIA REPAIR  04/16/2006   Left flank incisional hernia repair with mesh.  Dr Philippa Chester HERNIA REPAIR N/A 02/11/2021   Procedure: LAPAROSCOPIC REPAIR OF  INCISIONAL RECURRENT LEFT FLANK HERNIA WITH MESH TAP BLOCK - BILATERAL;  Surgeon: Karie Soda, MD;  Location: [facility];   Service: General;  Laterality: N/A;   left leg broken     PROSTATE BIOPSY  2007   neg   ROTATOR CUFF REPAIR     bilateral; Dr. Thurston Hole   Patient Active Problem List   Diagnosis Date Noted   Acute otitis media 08/31/2023   COVID-19 08/31/2023   Allergic rhinitis 02/17/2023   Weight loss 02/17/2023   Acute upper respiratory infection 02/09/2023   Cough 11/22/2022   Pulsatile tinnitus of right ear 08/23/2022   Sensorineural hearing loss (SNHL) of both ears 08/23/2022   Aortic atherosclerosis (HCC) 06/21/2022   Foreign body (FB) in soft tissue 11/07/2021   Chronic left flank pain 06/29/2021   Kidney stone 06/08/2021   Constipation, chronic 02/18/2021   Acute urinary retention 02/18/2021   Diverticular disease of colon 02/11/2021   History of colonic polyps 02/11/2021   Colon cancer screening 02/11/2021   Recurrent left flank incisional hernia s/p lap repair w mesh 02/11/2021 02/11/2021   Postcalcaneal bursitis of left foot 04/09/2020   Disorder of left eustachian tube 08/14/2019   Vertigo 12/19/2018   Partial degenerative rupture of right biceps tendon 07/18/2017   Right lateral epicondylitis 07/18/2017   Hyperglycemia  04/18/2017   High foot arch 03/29/2016   Arthritis, midfoot 03/29/2016   Tibialis posterior tendinitis 03/29/2016   Left foot pain 03/28/2016   Mitral regurgitation 09/05/2013   Carotid artery disease without cerebral infarction [facility])    Hx of CABG    Ejection fraction    Preventative health care 08/30/2012   Atrial fibrillation (HCC)    Thrombocytopenia (HCC) 07/26/2009   Headache 07/15/2009   Hyperlipidemia 02/09/2009   Anxiety state 02/09/2009   Depression 02/09/2009   BENIGN PROSTATIC HYPERTROPHY 02/09/2009   PSA, INCREASED 02/09/2009   NEPHROLITHIASIS, HX OF 02/09/2009   CAD (coronary artery disease) 07/11/2008     PCP: Oliver Barre  REFERRING PROVIDER: [name]  REFERRING DIAG: R hip pain  THERAPY DIAG:  Pain in right hip  Rationale for  Evaluation and Treatment: Rehabilitation  ONSET DATE:    SUBJECTIVE:   SUBJECTIVE STATEMENT: Pt states improving pain in R glute. L groin has been sore this week. States has been sore for a long time.    Eval: Pt states increased pain in R glute for several months. Recently he played golf and reports "couldn't walk" after. He has not had pain in this area before. He usually golfs 3 x/wk. Feels his swing is not the best due to other things hurting in the past, L ankle and L torso.  States most pain in R glute with turning, changing directions, bending forward and playing golf.    PERTINENT HISTORY: CAD, A-fib  , CABG, MI,   PAIN:  Are you having pain? Yes: NPRS scale: up to 10/10 Pain location: R glute  Pain description: very painful,  Aggravating factors: turning, golfing, bending fwd.  Relieving factors: can walk it off   PRECAUTIONS: None  WEIGHT BEARING RESTRICTIONS: No  FALLS:  Has patient fallen in last 6 months? No   PLOF: Independent  PATIENT GOALS:  Decreased pain , keep playing golf   NEXT MD VISIT:   OBJECTIVE:   DIAGNOSTIC FINDINGS:   PATIENT SURVEYS:    COGNITION: Overall cognitive status: Within functional limits for tasks assessed     SENSATION: WFL  EDEMA:    POSTURE:    No Significant postural limitations  PALPATION:   Tightness and soreness in R glute, piriformis,  no pain into gr troch or in lumbar spine.  Stiffness/hypomobile hips bil   LOWER EXTREMITY ROM: Hip flexion: mod limitation bilaterally Hip ER: mod limitation bilaterally Hip IR: mild limitation bilaterally, mild soreness on L.   LOWER EXTREMITY MMT:  MMT Left eval Right  eval  Hip flexion 4 4  Hip extension    Hip abduction 4 4  Hip adduction    Hip internal rotation    Hip external rotation 4 4  Knee flexion    Knee extension 5 5  Ankle dorsiflexion 5 5  Ankle plantarflexion    Ankle inversion    Ankle eversion     (Blank rows = not tested)  LOWER  EXTREMITY SPECIAL TESTS:  Mild soreness in FadIR on L;    GAIT:    TODAY'S TREATMENT:  DATE:   03/04/2024 Therapeutic Exercise: Aerobic: Supine:  hip Er fallouts x 10/slow for rom;  clams GTB x 15 for strength;  Seated: Standing:   Stretches:  hip IR across body 20 sec x 3 on R;   SKTC 30 sec x 3 on R;  LTR x 15/slow;  Neuromuscular Re-education: L/R weight shifts with trunk rotation- pt with lob when transferring weight to L side, with L toeing out, decreased trunk and hip rotation, possible cause of L groin pain.  Manual Therapy: DTM/TPR to R glute , hip inf and post mobs bil;  long leg distraction for hips bil;   Therapeutic Activity:  Self Care: Modalities:    PATIENT EDUCATION:  Education details: updated and reviewed HEP Person educated: Patient Education method: Explanation, Demonstration, Tactile cues, Verbal cues, and Handouts Education comprehension: verbalized understanding, returned demonstration, verbal cues required, tactile cues required, and needs further education   HOME EXERCISE PROGRAM: Access Code: [email or web address] URL: [email or web address] Date: 02/28/2024 Prepared by: Sedalia Muta  Exercises - Supine Single Knee to Chest Stretch  - 2 x daily - 3 reps - 30 hold - Supine Piriformis Stretch with Leg Straight  - 2 x daily - 3 reps - 20 hold  ASSESSMENT:  CLINICAL IMPRESSION: Pt with improving soreness in Glute. Continued manual for muscle tension today, and added hip mobilizations for ROM. Pt likes to golf, but having pain with repeated swings/play. Much compensation seen today with swing, as well as lob(pt self corrected ) and instability on L foot with weight shift onto L side. Pt to benefit from improving trunk and hip rotation for ease and safety of weight shift onto L, as well as to decrease hip pain.   Eval: Patient  presents with primary complaint of  pain in R glute. He has stiffness in bil hip joints, as well as pain and tightness in R glute. He has increased pain with bending, turning and transfers. Pt with decreased ability for full functional activities. Pt will  benefit from skilled PT to improve deficits and pain and to return to PLOF.   OBJECTIVE IMPAIRMENTS: decreased activity tolerance, decreased mobility, decreased ROM, decreased strength, increased muscle spasms, impaired flexibility, improper body mechanics, and pain.   ACTIVITY LIMITATIONS: bending, standing, squatting, stairs, transfers, and locomotion level  PARTICIPATION LIMITATIONS: cleaning, driving, shopping, community activity, and yard work  PERSONAL FACTORS: Time since onset of injury/illness/exacerbation are also affecting patient's functional outcome.   REHAB POTENTIAL: Good  CLINICAL DECISION MAKING: Stable/uncomplicated  EVALUATION COMPLEXITY: Low   GOALS: Goals reviewed with patient? Yes  SHORT TERM GOALS: Target date: 03/20/2024  Pt to be independent with initial HEP  Goal status: INITIAL  2.  Pt to report decreased pain in R glute to 0-5/10 with activity   Goal status: INITIAL     LONG TERM GOALS: Target date: 04/24/2024   Pt to be independent with final HEP  Goal status: INITIAL  2.  Pt to report decreased pain to 0-2/10 with activity and IADLs.   Goal status: INITIAL  3.  Pt to demonstrate ability for safe weight shifts from R to L for improving stability and safety with golf   Goal status: INITIAL  4.  Pt to report ability for stairs, walking, and transfers in/out of car to be pain free, to improve ability for community activity.   Goal status: INITIAL     PLAN:  PT FREQUENCY: 1-2x/week  PT DURATION: 8 weeks  PLANNED INTERVENTIONS: Therapeutic exercises, Therapeutic activity, Neuromuscular re-education,  Patient/Family education, Self Care, Joint mobilization, Joint manipulation, Stair  training, Orthotic/Fit training, DME instructions, Aquatic Therapy, Dry Needling, Electrical stimulation, Cryotherapy, Moist heat, Taping, Ultrasound, Ionotophoresis 4mg /ml Dexamethasone, Manual therapy,  Vasopneumatic device, Traction, Spinal manipulation, Spinal mobilization,Balance training, Gait training,   PLAN FOR NEXT SESSION: hip mobility, R glute muscle tightness, strength when tolerated.    Sedalia Muta, PT, DPT 10:05 AM  03/04/24

## 2024-03-13 ENCOUNTER — Ambulatory Visit (INDEPENDENT_AMBULATORY_CARE_PROVIDER_SITE_OTHER): Admitting: Physical Therapy

## 2024-03-13 ENCOUNTER — Encounter: Payer: Self-pay | Admitting: Physical Therapy

## 2024-03-13 DIAGNOSIS — M25551 Pain in right hip: Secondary | ICD-10-CM | POA: Diagnosis not present

## 2024-03-13 NOTE — Therapy (Signed)
 OUTPATIENT PHYSICAL THERAPY LOWER EXTREMITY TREATMENT   Patient Name: [name] MRN: [id number] DOB:07-31-1941, 83 y.o., male Today's Date: 03/13/2024  END OF SESSION:  PT End of Session - 03/13/24 1258     Visit Number 3    Number of Visits 16    Date for PT Re-Evaluation 04/24/24    Authorization Type Medicare    PT Start Time 1256    PT Stop Time 1335    PT Time Calculation (min) 39 min    Activity Tolerance Patient tolerated treatment well    Behavior During Therapy [facility] for tasks assessed/performed              Past Medical History:  Diagnosis Date   Aortic atherosclerosis (HCC) 06/21/2022   Arthritis    Atrial fibrillation (HCC)    post MI-amiodarone stopped 07/2008   Benign prostatic hypertrophy    CAD (coronary artery disease) 07/2008   2 BMS placed for MI, August, 2009   Carotid artery disease without cerebral infarction [facility])    CORONARY ARTERY BYPASS GRAFT, HX OF 07/13/2009   August, 2009, Dr. Laneta Simmers   Ejection fraction    .   History of kidney stones    History of nephrolithiasis    Hx of CABG    Hx of colonoscopy    Hyperlipidemia    HYPERLIPIDEMIA 02/09/2009   Qualifier: Diagnosis of  By: [name] MD, Len Blalock    Increased prostate specific antigen (PSA) velocity    Myocardial infarction (HCC)    NEPHROLITHIASIS, HX OF 02/09/2009   Qualifier: Diagnosis of  By: [name] MD, Len Blalock    Prostatitis    PSA elevation 09/06/2012   Thrombocytopenia [facility])    Past Surgical History:  Procedure Laterality Date   CORONARY ARTERY BYPASS GRAFT  08/05/08   x5, LIMA to the LAD.    CORONARY STENT PLACEMENT  07/2008   2 BMS to RCA   HEMORRHOID SURGERY     HYDROCELE EXCISION     INCISIONAL HERNIA REPAIR  04/16/2006   Left flank incisional hernia repair with mesh.  Dr Philippa Chester HERNIA REPAIR N/A 02/11/2021   Procedure: LAPAROSCOPIC REPAIR OF  INCISIONAL RECURRENT LEFT FLANK HERNIA WITH MESH TAP BLOCK - BILATERAL;  Surgeon: Karie Soda, MD;  Location: [facility];   Service: General;  Laterality: N/A;   left leg broken     PROSTATE BIOPSY  2007   neg   ROTATOR CUFF REPAIR     bilateral; Dr. Thurston Hole   Patient Active Problem List   Diagnosis Date Noted   Acute otitis media 08/31/2023   COVID-19 08/31/2023   Allergic rhinitis 02/17/2023   Weight loss 02/17/2023   Acute upper respiratory infection 02/09/2023   Cough 11/22/2022   Pulsatile tinnitus of right ear 08/23/2022   Sensorineural hearing loss (SNHL) of both ears 08/23/2022   Aortic atherosclerosis (HCC) 06/21/2022   Foreign body (FB) in soft tissue 11/07/2021   Chronic left flank pain 06/29/2021   Kidney stone 06/08/2021   Constipation, chronic 02/18/2021   Acute urinary retention 02/18/2021   Diverticular disease of colon 02/11/2021   History of colonic polyps 02/11/2021   Colon cancer screening 02/11/2021   Recurrent left flank incisional hernia s/p lap repair w mesh 02/11/2021 02/11/2021   Postcalcaneal bursitis of left foot 04/09/2020   Disorder of left eustachian tube 08/14/2019   Vertigo 12/19/2018   Partial degenerative rupture of right biceps tendon 07/18/2017   Right lateral epicondylitis 07/18/2017  Hyperglycemia 04/18/2017   High foot arch 03/29/2016   Arthritis, midfoot 03/29/2016   Tibialis posterior tendinitis 03/29/2016   Left foot pain 03/28/2016   Mitral regurgitation 09/05/2013   Carotid artery disease without cerebral infarction [facility])    Hx of CABG    Ejection fraction    Preventative health care 08/30/2012   Atrial fibrillation (HCC)    Thrombocytopenia (HCC) 07/26/2009   Headache 07/15/2009   Hyperlipidemia 02/09/2009   Anxiety state 02/09/2009   Depression 02/09/2009   BENIGN PROSTATIC HYPERTROPHY 02/09/2009   PSA, INCREASED 02/09/2009   NEPHROLITHIASIS, HX OF 02/09/2009   CAD (coronary artery disease) 07/11/2008     PCP: Oliver Barre  REFERRING PROVIDER: [name]  REFERRING DIAG: R hip pain  THERAPY DIAG:  Pain in right hip  Rationale for  Evaluation and Treatment: Rehabilitation  ONSET DATE:    SUBJECTIVE:   SUBJECTIVE STATEMENT: Pt states improving pain in R glute. Still sore but not as bad. L groin still sore with activity, getting out of car and with golf/activity.    Eval: Pt states increased pain in R glute for several months. Recently he played golf and reports "couldn't walk" after. He has not had pain in this area before. He usually golfs 3 x/wk. Feels his swing is not the best due to other things hurting in the past, L ankle and L torso.  States most pain in R glute with turning, changing directions, bending forward and playing golf.    PERTINENT HISTORY: CAD, A-fib  , CABG, MI,   PAIN:  Are you having pain? Yes: NPRS scale: up to 10/10 Pain location: R glute  Pain description: very painful,  Aggravating factors: turning, golfing, bending fwd.  Relieving factors: can walk it off   PRECAUTIONS: None  WEIGHT BEARING RESTRICTIONS: No  FALLS:  Has patient fallen in last 6 months? No   PLOF: Independent  PATIENT GOALS:  Decreased pain , keep playing golf   NEXT MD VISIT:   OBJECTIVE:   DIAGNOSTIC FINDINGS:   PATIENT SURVEYS:    COGNITION: Overall cognitive status: Within functional limits for tasks assessed     SENSATION: WFL  EDEMA:    POSTURE:    No Significant postural limitations  PALPATION:   Tightness and soreness in R glute, piriformis,  no pain into gr troch or in lumbar spine.  Stiffness/hypomobile hips bil   LOWER EXTREMITY ROM: Hip flexion: mod limitation bilaterally Hip ER: mod limitation bilaterally Hip IR: mild limitation bilaterally, mild soreness on L.   LOWER EXTREMITY MMT:  MMT Left eval Right  eval  Hip flexion 4 4  Hip extension    Hip abduction 4 4  Hip adduction    Hip internal rotation    Hip external rotation 4 4  Knee flexion    Knee extension 5 5  Ankle dorsiflexion 5 5  Ankle plantarflexion    Ankle inversion    Ankle eversion     (Blank  rows = not tested)  LOWER EXTREMITY SPECIAL TESTS:  Mild soreness in FadIR on L;    GAIT:    TODAY'S TREATMENT:  DATE:   03/13/2024 Therapeutic Exercise: Aerobic: Supine:  hip ER fallouts x 10/slow for rom;   Hip IR rom x 10 rom, 5 sec holds;  Seated: Standing:  hip abd 2 x 10 bil; cueing for decreasing back compensation;  Stretches:  seated hamstring stretch 30 sec x 3 bil;  Neuromuscular Re-education: L/R weight shifts with trunk rotation 2 x10, tactile cuing for L foot to stay in place with weight shift;   SLS L 20 sec  3- at counter Manual Therapy: DTM/TPR to R glute , hip inf and post mobs bil;  long leg distraction for hips bil;  stretching for hip IR and ER bil;  Therapeutic Activity:  Self Care: Modalities:    PATIENT EDUCATION:  Education details: updated and reviewed HEP Person educated: Patient Education method: Explanation, Demonstration, Tactile cues, Verbal cues, and Handouts Education comprehension: verbalized understanding, returned demonstration, verbal cues required, tactile cues required, and needs further education   HOME EXERCISE PROGRAM: Access Code: [email or web address] URL: [email or web address] Date: 02/28/2024 Prepared by: Sedalia Muta  Exercises - Supine Single Knee to Chest Stretch  - 2 x daily - 3 reps - 30 hold - Supine Piriformis Stretch with Leg Straight  - 2 x daily - 3 reps - 20 hold  ASSESSMENT:  CLINICAL IMPRESSION: Pt with improving pain, but R glute still sore with activity and tender with palpation. Continued work on hip mobility today. Pt with minimal pain during session today with activity. Practice for SLS on L and weight shifts onto L, with optimal mechanics, for decreasing pain with this, improved mechanics after practice, but still will require further work on this in future sessions. Pt to benefit  from progressive strength as able.   Eval: Patient presents with primary complaint of  pain in R glute. He has stiffness in bil hip joints, as well as pain and tightness in R glute. He has increased pain with bending, turning and transfers. Pt with decreased ability for full functional activities. Pt will  benefit from skilled PT to improve deficits and pain and to return to PLOF.   OBJECTIVE IMPAIRMENTS: decreased activity tolerance, decreased mobility, decreased ROM, decreased strength, increased muscle spasms, impaired flexibility, improper body mechanics, and pain.   ACTIVITY LIMITATIONS: bending, standing, squatting, stairs, transfers, and locomotion level  PARTICIPATION LIMITATIONS: cleaning, driving, shopping, community activity, and yard work  PERSONAL FACTORS: Time since onset of injury/illness/exacerbation are also affecting patient's functional outcome.   REHAB POTENTIAL: Good  CLINICAL DECISION MAKING: Stable/uncomplicated  EVALUATION COMPLEXITY: Low   GOALS: Goals reviewed with patient? Yes  SHORT TERM GOALS: Target date: 03/20/2024  Pt to be independent with initial HEP  Goal status: INITIAL  2.  Pt to report decreased pain in R glute to 0-5/10 with activity   Goal status: INITIAL     LONG TERM GOALS: Target date: 04/24/2024   Pt to be independent with final HEP  Goal status: INITIAL  2.  Pt to report decreased pain to 0-2/10 with activity and IADLs.   Goal status: INITIAL  3.  Pt to demonstrate ability for safe weight shifts from R to L for improving stability and safety with golf   Goal status: INITIAL  4.  Pt to report ability for stairs, walking, and transfers in/out of car to be pain free, to improve ability for community activity.   Goal status: INITIAL     PLAN:  PT FREQUENCY: 1-2x/week  PT DURATION: 8 weeks  PLANNED INTERVENTIONS: Therapeutic exercises, Therapeutic activity, Neuromuscular re-education,  Patient/Family education, Self  Care, Joint mobilization, Joint manipulation, Stair training, Orthotic/Fit training, DME instructions, Aquatic Therapy, Dry Needling, Electrical stimulation, Cryotherapy, Moist heat, Taping, Ultrasound, Ionotophoresis 4mg /ml Dexamethasone, Manual therapy,  Vasopneumatic device, Traction, Spinal manipulation, Spinal mobilization,Balance training, Gait training,   PLAN FOR NEXT SESSION: hip mobility, R glute muscle tightness, strength when tolerated.    Sedalia Muta, PT, DPT 2:47 PM  03/13/24

## 2024-03-20 ENCOUNTER — Ambulatory Visit (INDEPENDENT_AMBULATORY_CARE_PROVIDER_SITE_OTHER): Admitting: Physical Therapy

## 2024-03-20 ENCOUNTER — Encounter: Payer: Self-pay | Admitting: Physical Therapy

## 2024-03-20 DIAGNOSIS — M25551 Pain in right hip: Secondary | ICD-10-CM | POA: Diagnosis not present

## 2024-03-20 NOTE — Therapy (Signed)
 OUTPATIENT PHYSICAL THERAPY LOWER EXTREMITY TREATMENT   Patient Name: [name] MRN: [id number] DOB:1941/07/18, 83 y.o., male Today's Date: 03/20/2024  END OF SESSION:  PT End of Session - 03/20/24 1359     Visit Number 4    Number of Visits 16    Date for PT Re-Evaluation 04/24/24    Authorization Type Medicare    PT Start Time 1302    PT Stop Time 1340    PT Time Calculation (min) 38 min    Activity Tolerance Patient tolerated treatment well    Behavior During Therapy [facility] for tasks assessed/performed               Past Medical History:  Diagnosis Date   Aortic atherosclerosis (HCC) 06/21/2022   Arthritis    Atrial fibrillation (HCC)    post MI-amiodarone stopped 07/2008   Benign prostatic hypertrophy    CAD (coronary artery disease) 07/2008   2 BMS placed for MI, August, 2009   Carotid artery disease without cerebral infarction [facility])    CORONARY ARTERY BYPASS GRAFT, HX OF 07/13/2009   August, 2009, Dr. Laneta Simmers   Ejection fraction    .   History of kidney stones    History of nephrolithiasis    Hx of CABG    Hx of colonoscopy    Hyperlipidemia    HYPERLIPIDEMIA 02/09/2009   Qualifier: Diagnosis of  By: [name] MD, Len Blalock    Increased prostate specific antigen (PSA) velocity    Myocardial infarction (HCC)    NEPHROLITHIASIS, HX OF 02/09/2009   Qualifier: Diagnosis of  By: [name] MD, Len Blalock    Prostatitis    PSA elevation 09/06/2012   Thrombocytopenia [facility])    Past Surgical History:  Procedure Laterality Date   CORONARY ARTERY BYPASS GRAFT  08/05/08   x5, LIMA to the LAD.    CORONARY STENT PLACEMENT  07/2008   2 BMS to RCA   HEMORRHOID SURGERY     HYDROCELE EXCISION     INCISIONAL HERNIA REPAIR  04/16/2006   Left flank incisional hernia repair with mesh.  Dr Philippa Chester HERNIA REPAIR N/A 02/11/2021   Procedure: LAPAROSCOPIC REPAIR OF  INCISIONAL RECURRENT LEFT FLANK HERNIA WITH MESH TAP BLOCK - BILATERAL;  Surgeon: Karie Soda, MD;  Location: [facility];   Service: General;  Laterality: N/A;   left leg broken     PROSTATE BIOPSY  2007   neg   ROTATOR CUFF REPAIR     bilateral; Dr. Thurston Hole   Patient Active Problem List   Diagnosis Date Noted   Acute otitis media 08/31/2023   COVID-19 08/31/2023   Allergic rhinitis 02/17/2023   Weight loss 02/17/2023   Acute upper respiratory infection 02/09/2023   Cough 11/22/2022   Pulsatile tinnitus of right ear 08/23/2022   Sensorineural hearing loss (SNHL) of both ears 08/23/2022   Aortic atherosclerosis (HCC) 06/21/2022   Foreign body (FB) in soft tissue 11/07/2021   Chronic left flank pain 06/29/2021   Kidney stone 06/08/2021   Constipation, chronic 02/18/2021   Acute urinary retention 02/18/2021   Diverticular disease of colon 02/11/2021   History of colonic polyps 02/11/2021   Colon cancer screening 02/11/2021   Recurrent left flank incisional hernia s/p lap repair w mesh 02/11/2021 02/11/2021   Postcalcaneal bursitis of left foot 04/09/2020   Disorder of left eustachian tube 08/14/2019   Vertigo 12/19/2018   Partial degenerative rupture of right biceps tendon 07/18/2017   Right lateral epicondylitis 07/18/2017  Hyperglycemia 04/18/2017   High foot arch 03/29/2016   Arthritis, midfoot 03/29/2016   Tibialis posterior tendinitis 03/29/2016   Left foot pain 03/28/2016   Mitral regurgitation 09/05/2013   Carotid artery disease without cerebral infarction [facility])    Hx of CABG    Ejection fraction    Preventative health care 08/30/2012   Atrial fibrillation (HCC)    Thrombocytopenia (HCC) 07/26/2009   Headache 07/15/2009   Hyperlipidemia 02/09/2009   Anxiety state 02/09/2009   Depression 02/09/2009   BENIGN PROSTATIC HYPERTROPHY 02/09/2009   PSA, INCREASED 02/09/2009   NEPHROLITHIASIS, HX OF 02/09/2009   CAD (coronary artery disease) 07/11/2008     PCP: Oliver Barre  REFERRING PROVIDER: [name]  REFERRING DIAG: R hip pain  THERAPY DIAG:  Pain in right hip  Rationale for  Evaluation and Treatment: Rehabilitation  ONSET DATE:    SUBJECTIVE:   SUBJECTIVE STATEMENT: Pt states improving pain in R glute. Still sore but mild. L groin still sore with activity, getting out of car and with golf/activity.  L ankle feeling uncomfortable with golf.    Eval: Pt states increased pain in R glute for several months. Recently he played golf and reports "couldn't walk" after. He has not had pain in this area before. He usually golfs 3 x/wk. Feels his swing is not the best due to other things hurting in the past, L ankle and L torso.  States most pain in R glute with turning, changing directions, bending forward and playing golf.    PERTINENT HISTORY: CAD, A-fib  , CABG, MI,   PAIN:  Are you having pain? Yes: NPRS scale: up to 10/10 Pain location: R glute  Pain description: very painful,  Aggravating factors: turning, golfing, bending fwd.  Relieving factors: can walk it off   PRECAUTIONS: None  WEIGHT BEARING RESTRICTIONS: No  FALLS:  Has patient fallen in last 6 months? No   PLOF: Independent  PATIENT GOALS:  Decreased pain , keep playing golf   NEXT MD VISIT:   OBJECTIVE:   DIAGNOSTIC FINDINGS:   PATIENT SURVEYS:    COGNITION: Overall cognitive status: Within functional limits for tasks assessed     SENSATION: WFL  EDEMA:    POSTURE:    No Significant postural limitations  PALPATION:   Tightness and soreness in R glute, piriformis,  no pain into gr troch or in lumbar spine.  Stiffness/hypomobile hips bil   LOWER EXTREMITY ROM: Hip flexion: mod limitation bilaterally Hip ER: mod limitation bilaterally Hip IR: mild limitation bilaterally, mild soreness on L.   LOWER EXTREMITY MMT:  MMT Left eval Right  eval  Hip flexion 4 4  Hip extension    Hip abduction 4 4  Hip adduction    Hip internal rotation    Hip external rotation 4 4  Knee flexion    Knee extension 5 5  Ankle dorsiflexion 5 5  Ankle plantarflexion    Ankle  inversion    Ankle eversion     (Blank rows = not tested)  LOWER EXTREMITY SPECIAL TESTS:  Mild soreness in FadIR on L;    GAIT:   TODAY'S TREATMENT:  DATE:   03/20/2024 Therapeutic Exercise: Aerobic: Supine:  hip ER fallouts x 10/slow for rom;   Hip IR rom x 10 rom, 5 sec holds; LTR x 10;  Seated: Standing:  lateral step up x 8 bil  , 6 in step.  Stretches:  seated hamstring stretch 30 sec x 3 bil; seated piriformis x 3 bil;  Neuromuscular Re-education: L/R weight shifts with trunk rotation 2 x10, tactile cuing for L foot to stay in place with weight shift;  Slow march for balance x 15;  SLS L 20 sec  3- at counter Manual Therapy:  Therapeutic Activity:  Self Care: Modalities:     Therapeutic Exercise: Aerobic: Supine:  hip ER fallouts x 10/slow for rom;   Hip IR rom x 10 rom, 5 sec holds;  Seated: Standing:  hip abd 2 x 10 bil; cueing for decreasing back compensation;  Stretches:  seated hamstring stretch 30 sec x 3 bil;  Neuromuscular Re-education: L/R weight shifts with trunk rotation 2 x10, tactile cuing for L foot to stay in place with weight shift;   SLS L 20 sec  3- at counter Manual Therapy: DTM/TPR to R glute , hip inf and post mobs bil;  long leg distraction for hips bil;  stretching for hip IR and ER bil;  Therapeutic Activity:  Self Care: Modalities:    PATIENT EDUCATION:  Education details: updated and reviewed HEP Person educated: Patient Education method: Explanation, Demonstration, Tactile cues, Verbal cues, and Handouts Education comprehension: verbalized understanding, returned demonstration, verbal cues required, tactile cues required, and needs further education   HOME EXERCISE PROGRAM: Access Code: [email or web address] URL: [email or web address] Date: 02/28/2024 Prepared by: Sedalia Muta  Exercises - Supine  Single Knee to Chest Stretch  - 2 x daily - 3 reps - 30 hold - Supine Piriformis Stretch with Leg Straight  - 2 x daily - 3 reps - 20 hold  ASSESSMENT:  CLINICAL IMPRESSION: Pt with improving pain overall. Much less tenderness in R glute today with palpation. Challenged with step ups and weight shifts today, will benefit from continued care.    Eval: Patient presents with primary complaint of  pain in R glute. He has stiffness in bil hip joints, as well as pain and tightness in R glute. He has increased pain with bending, turning and transfers. Pt with decreased ability for full functional activities. Pt will  benefit from skilled PT to improve deficits and pain and to return to PLOF.   OBJECTIVE IMPAIRMENTS: decreased activity tolerance, decreased mobility, decreased ROM, decreased strength, increased muscle spasms, impaired flexibility, improper body mechanics, and pain.   ACTIVITY LIMITATIONS: bending, standing, squatting, stairs, transfers, and locomotion level  PARTICIPATION LIMITATIONS: cleaning, driving, shopping, community activity, and yard work  PERSONAL FACTORS: Time since onset of injury/illness/exacerbation are also affecting patient's functional outcome.   REHAB POTENTIAL: Good  CLINICAL DECISION MAKING: Stable/uncomplicated  EVALUATION COMPLEXITY: Low   GOALS: Goals reviewed with patient? Yes  SHORT TERM GOALS: Target date: 03/20/2024  Pt to be independent with initial HEP  Goal status: INITIAL  2.  Pt to report decreased pain in R glute to 0-5/10 with activity   Goal status: INITIAL     LONG TERM GOALS: Target date: 04/24/2024   Pt to be independent with final HEP  Goal status: INITIAL  2.  Pt to report decreased pain to 0-2/10 with activity and IADLs.   Goal status: INITIAL  3.  Pt to demonstrate ability for safe weight shifts from R to  L for improving stability and safety with golf   Goal status: INITIAL  4.  Pt to report ability for stairs,  walking, and transfers in/out of car to be pain free, to improve ability for community activity.   Goal status: INITIAL     PLAN:  PT FREQUENCY: 1-2x/week  PT DURATION: 8 weeks  PLANNED INTERVENTIONS: Therapeutic exercises, Therapeutic activity, Neuromuscular re-education, Patient/Family education, Self Care, Joint mobilization, Joint manipulation, Stair training, Orthotic/Fit training, DME instructions, Aquatic Therapy, Dry Needling, Electrical stimulation, Cryotherapy, Moist heat, Taping, Ultrasound, Ionotophoresis 4mg /ml Dexamethasone, Manual therapy,  Vasopneumatic device, Traction, Spinal manipulation, Spinal mobilization,Balance training, Gait training,   PLAN FOR NEXT SESSION: hip mobility, R glute muscle tightness, strength when tolerated.    Sedalia Muta, PT, DPT 2:00 PM  03/20/24

## 2024-03-27 ENCOUNTER — Encounter: Admitting: Physical Therapy

## 2024-04-03 ENCOUNTER — Encounter: Payer: Self-pay | Admitting: Physical Therapy

## 2024-04-03 ENCOUNTER — Ambulatory Visit: Admitting: Physical Therapy

## 2024-04-03 DIAGNOSIS — M25551 Pain in right hip: Secondary | ICD-10-CM | POA: Diagnosis not present

## 2024-04-03 NOTE — Therapy (Signed)
 OUTPATIENT PHYSICAL THERAPY LOWER EXTREMITY TREATMENT   Patient Name: [name] MRN: [id number] DOB:1941/01/19, 83 y.o., male Today's Date: 04/03/2024  END OF SESSION:  PT End of Session - 04/03/24 1259     Visit Number 5    Number of Visits 16    Date for PT Re-Evaluation 04/24/24    Authorization Type Medicare    PT Start Time 1303    PT Stop Time 1343    PT Time Calculation (min) 40 min    Activity Tolerance Patient tolerated treatment well    Behavior During Therapy [facility] for tasks assessed/performed               Past Medical History:  Diagnosis Date   Aortic atherosclerosis (HCC) 06/21/2022   Arthritis    Atrial fibrillation (HCC)    post MI-amiodarone stopped 07/2008   Benign prostatic hypertrophy    CAD (coronary artery disease) 07/2008   2 BMS placed for MI, August, 2009   Carotid artery disease without cerebral infarction [facility])    CORONARY ARTERY BYPASS GRAFT, HX OF 07/13/2009   August, 2009, Dr. Sherene Dilling   Ejection fraction    .   History of kidney stones    History of nephrolithiasis    Hx of CABG    Hx of colonoscopy    Hyperlipidemia    HYPERLIPIDEMIA 02/09/2009   Qualifier: Diagnosis of  By: Autry Legions MD, Alveda Aures    Increased prostate specific antigen (PSA) velocity    Myocardial infarction (HCC)    NEPHROLITHIASIS, HX OF 02/09/2009   Qualifier: Diagnosis of  By: Autry Legions MD, Alveda Aures    Prostatitis    PSA elevation 09/06/2012   Thrombocytopenia [facility])    Past Surgical History:  Procedure Laterality Date   CORONARY ARTERY BYPASS GRAFT  08/05/08   x5, LIMA to the LAD.    CORONARY STENT PLACEMENT  07/2008   2 BMS to RCA   HEMORRHOID SURGERY     HYDROCELE EXCISION     INCISIONAL HERNIA REPAIR  04/16/2006   Left flank incisional hernia repair with mesh.  Dr Joya Nissen HERNIA REPAIR N/A 02/11/2021   Procedure: LAPAROSCOPIC REPAIR OF  INCISIONAL RECURRENT LEFT FLANK HERNIA WITH MESH TAP BLOCK - BILATERAL;  Surgeon: Candyce Champagne, MD;  Location: [facility];   Service: General;  Laterality: N/A;   left leg broken     PROSTATE BIOPSY  2007   neg   ROTATOR CUFF REPAIR     bilateral; Dr. Jinger Mount   Patient Active Problem List   Diagnosis Date Noted   Acute otitis media 08/31/2023   COVID-19 08/31/2023   Allergic rhinitis 02/17/2023   Weight loss 02/17/2023   Acute upper respiratory infection 02/09/2023   Cough 11/22/2022   Pulsatile tinnitus of right ear 08/23/2022   Sensorineural hearing loss (SNHL) of both ears 08/23/2022   Aortic atherosclerosis (HCC) 06/21/2022   Foreign body (FB) in soft tissue 11/07/2021   Chronic left flank pain 06/29/2021   Kidney stone 06/08/2021   Constipation, chronic 02/18/2021   Acute urinary retention 02/18/2021   Diverticular disease of colon 02/11/2021   History of colonic polyps 02/11/2021   Colon cancer screening 02/11/2021   Recurrent left flank incisional hernia s/p lap repair w mesh 02/11/2021 02/11/2021   Postcalcaneal bursitis of left foot 04/09/2020   Disorder of left eustachian tube 08/14/2019   Vertigo 12/19/2018   Partial degenerative rupture of right biceps tendon 07/18/2017   Right lateral epicondylitis 07/18/2017  Hyperglycemia 04/18/2017   High foot arch 03/29/2016   Arthritis, midfoot 03/29/2016   Tibialis posterior tendinitis 03/29/2016   Left foot pain 03/28/2016   Mitral regurgitation 09/05/2013   Carotid artery disease without cerebral infarction [facility])    Hx of CABG    Ejection fraction    Preventative health care 08/30/2012   Atrial fibrillation (HCC)    Thrombocytopenia (HCC) 07/26/2009   Headache 07/15/2009   Hyperlipidemia 02/09/2009   Anxiety state 02/09/2009   Depression 02/09/2009   BENIGN PROSTATIC HYPERTROPHY 02/09/2009   PSA, INCREASED 02/09/2009   NEPHROLITHIASIS, HX OF 02/09/2009   CAD (coronary artery disease) 07/11/2008     PCP: Rosalia Colonel  REFERRING PROVIDER: Garlan Juniper  REFERRING DIAG: R hip pain  THERAPY DIAG:  Pain in right hip  Rationale for  Evaluation and Treatment: Rehabilitation  ONSET DATE:    SUBJECTIVE:   SUBJECTIVE STATEMENT: Pt states much improvement in pain in R glute. Did play 9 holes of golf without pain. Still having pain at times.    Eval: Pt states increased pain in R glute for several months. Recently he played golf and reports "couldn't walk" after. He has not had pain in this area before. He usually golfs 3 x/wk. Feels his swing is not the best due to other things hurting in the past, L ankle and L torso.  States most pain in R glute with turning, changing directions, bending forward and playing golf.    PERTINENT HISTORY: CAD, A-fib  , CABG, MI,   PAIN:  Are you having pain? Yes: NPRS scale: up to 6 /10 Pain location: R glute  Pain description: very painful,  Aggravating factors: turning, golfing, bending fwd.  Relieving factors: can walk it off   PRECAUTIONS: None  WEIGHT BEARING RESTRICTIONS: No  FALLS:  Has patient fallen in last 6 months? No   PLOF: Independent  PATIENT GOALS:  Decreased pain , keep playing golf   NEXT MD VISIT:   OBJECTIVE:   DIAGNOSTIC FINDINGS:   PATIENT SURVEYS:    COGNITION: Overall cognitive status: Within functional limits for tasks assessed     SENSATION: WFL  EDEMA:    POSTURE:    No Significant postural limitations  PALPATION:   Tightness and soreness in R glute, piriformis,  no pain into gr troch or in lumbar spine.  Stiffness/hypomobile hips bil   LOWER EXTREMITY ROM: Hip flexion: mod limitation bilaterally Hip ER: mod limitation bilaterally Hip IR: mild limitation bilaterally, mild soreness on L.   LOWER EXTREMITY MMT:  MMT Left eval Right  eval  Hip flexion 4 4  Hip extension    Hip abduction 4 4  Hip adduction    Hip internal rotation    Hip external rotation 4 4  Knee flexion    Knee extension 5 5  Ankle dorsiflexion 5 5  Ankle plantarflexion    Ankle inversion    Ankle eversion     (Blank rows = not  tested)  LOWER EXTREMITY SPECIAL TESTS:  Mild soreness in FadIR on L;    GAIT:   TODAY'S TREATMENT:  DATE:   04/03/2024 Therapeutic Exercise: Aerobic: Supine:  SLR x 10 bil;   bridging 2 x 10;   LTR x 10;  Seated: Standing:  hip abd 2x 10 bil;  Stretches:  seated hamstring stretch 30 sec x 3 bil;  Neuromuscular Re-education: L/R weight shifts with trunk rotation 2 x10, tactile cuing for L foot to stay in place with weight shift, slow and controlled x 15;   Slow march for balance x 20;  SLS 10 sec x 3 bil;  Manual Therapy:  Therapeutic Activity:  Fwd 6in step up no Ue support x 10 bil;  lateral step up x 8 bil  , 6 in step.   Squats to chair x 15;   Self Care: Modalities:    Therapeutic Exercise: Aerobic: Supine:  hip ER fallouts x 10/slow for rom;   Hip IR rom x 10 rom, 5 sec holds; LTR x 10;  Seated: Standing:    lateral step up x 8 bil  , 6 in step.  Stretches:  seated hamstring stretch 30 sec x 3 bil; seated piriformis x 3 bil;  Neuromuscular Re-education: L/R weight shifts with trunk rotation 2 x10, tactile cuing for L foot to stay in place with weight shift;  Slow march for balance x 15;  SLS L 20 sec  3- at counter Manual Therapy:  Therapeutic Activity:  Self Care: Modalities:     Therapeutic Exercise: Aerobic: Supine:  hip ER fallouts x 10/slow for rom;   Hip IR rom x 10 rom, 5 sec holds;  Seated: Standing:  hip abd 2 x 10 bil; cueing for decreasing back compensation;  Stretches:  seated hamstring stretch 30 sec x 3 bil;  Neuromuscular Re-education: L/R weight shifts with trunk rotation 2 x10, tactile cuing for L foot to stay in place with weight shift;   SLS L 20 sec  3- at counter Manual Therapy: DTM/TPR to R glute , hip inf and post mobs bil;  long leg distraction for hips bil;  stretching for hip IR and ER bil;  Therapeutic Activity:   Self Care: Modalities:    PATIENT EDUCATION:  Education details: updated and reviewed HEP Person educated: Patient Education method: Explanation, Demonstration, Tactile cues, Verbal cues, and Handouts Education comprehension: verbalized understanding, returned demonstration, verbal cues required, tactile cues required, and needs further education   HOME EXERCISE PROGRAM: Access Code: [email or web address] URL: [email or web address] Date: 02/28/2024 Prepared by: Terrilee Few  Exercises - Supine Single Knee to Chest Stretch  - 2 x daily - 3 reps - 30 hold - Supine Piriformis Stretch with Leg Straight  - 2 x daily - 3 reps - 20 hold  ASSESSMENT:  CLINICAL IMPRESSION: Pt progressing well. Will benefit from continued progression of strength and stability  in standing position.    Eval: Patient presents with primary complaint of  pain in R glute. He has stiffness in bil hip joints, as well as pain and tightness in R glute. He has increased pain with bending, turning and transfers. Pt with decreased ability for full functional activities. Pt will  benefit from skilled PT to improve deficits and pain and to return to PLOF.   OBJECTIVE IMPAIRMENTS: decreased activity tolerance, decreased mobility, decreased ROM, decreased strength, increased muscle spasms, impaired flexibility, improper body mechanics, and pain.   ACTIVITY LIMITATIONS: bending, standing, squatting, stairs, transfers, and locomotion level  PARTICIPATION LIMITATIONS: cleaning, driving, shopping, community activity, and yard work  PERSONAL FACTORS: Time since onset of injury/illness/exacerbation are also affecting patient's functional  outcome.   REHAB POTENTIAL: Good  CLINICAL DECISION MAKING: Stable/uncomplicated  EVALUATION COMPLEXITY: Low   GOALS: Goals reviewed with patient? Yes  SHORT TERM GOALS: Target date: 03/20/2024  Pt to be independent with initial HEP  Goal status: INITIAL  2.  Pt to report  decreased pain in R glute to 0-5/10 with activity   Goal status: INITIAL     LONG TERM GOALS: Target date: 04/24/2024   Pt to be independent with final HEP  Goal status: INITIAL  2.  Pt to report decreased pain to 0-2/10 with activity and IADLs.   Goal status: INITIAL  3.  Pt to demonstrate ability for safe weight shifts from R to L for improving stability and safety with golf   Goal status: INITIAL  4.  Pt to report ability for stairs, walking, and transfers in/out of car to be pain free, to improve ability for community activity.   Goal status: INITIAL     PLAN:  PT FREQUENCY: 1-2x/week  PT DURATION: 8 weeks  PLANNED INTERVENTIONS: Therapeutic exercises, Therapeutic activity, Neuromuscular re-education, Patient/Family education, Self Care, Joint mobilization, Joint manipulation, Stair training, Orthotic/Fit training, DME instructions, Aquatic Therapy, Dry Needling, Electrical stimulation, Cryotherapy, Moist heat, Taping, Ultrasound, Ionotophoresis 4mg /ml Dexamethasone, Manual therapy,  Vasopneumatic device, Traction, Spinal manipulation, Spinal mobilization,Balance training, Gait training,   PLAN FOR NEXT SESSION: hip mobility, R glute muscle tightness, strength when tolerated.    Terrilee Few, PT, DPT 12:59 PM  04/03/24

## 2024-04-08 ENCOUNTER — Ambulatory Visit (INDEPENDENT_AMBULATORY_CARE_PROVIDER_SITE_OTHER): Admitting: Physical Therapy

## 2024-04-08 ENCOUNTER — Encounter: Payer: Self-pay | Admitting: Physical Therapy

## 2024-04-08 DIAGNOSIS — M25551 Pain in right hip: Secondary | ICD-10-CM | POA: Diagnosis not present

## 2024-04-08 NOTE — Therapy (Signed)
 OUTPATIENT PHYSICAL THERAPY LOWER EXTREMITY TREATMENT   Patient Name: [name] MRN: [id number] DOB:1940/12/31, 83 y.o., male Today's Date: 04/08/2024  END OF SESSION:  PT End of Session - 04/08/24 0929     Visit Number 6    Number of Visits 16    Date for PT Re-Evaluation 04/24/24    Authorization Type Medicare    PT Start Time 0929    PT Stop Time 1010    PT Time Calculation (min) 41 min    Activity Tolerance Patient tolerated treatment well    Behavior During Therapy [facility] for tasks assessed/performed               Past Medical History:  Diagnosis Date   Aortic atherosclerosis (HCC) 06/21/2022   Arthritis    Atrial fibrillation (HCC)    post MI-amiodarone stopped 07/2008   Benign prostatic hypertrophy    CAD (coronary artery disease) 07/2008   2 BMS placed for MI, August, 2009   Carotid artery disease without cerebral infarction [facility])    CORONARY ARTERY BYPASS GRAFT, HX OF 07/13/2009   August, 2009, Dr. Sherene Dilling   Ejection fraction    .   History of kidney stones    History of nephrolithiasis    Hx of CABG    Hx of colonoscopy    Hyperlipidemia    HYPERLIPIDEMIA 02/09/2009   Qualifier: Diagnosis of  By: Autry Legions MD, Alveda Aures    Increased prostate specific antigen (PSA) velocity    Myocardial infarction (HCC)    NEPHROLITHIASIS, HX OF 02/09/2009   Qualifier: Diagnosis of  By: Autry Legions MD, Alveda Aures    Prostatitis    PSA elevation 09/06/2012   Thrombocytopenia [facility])    Past Surgical History:  Procedure Laterality Date   CORONARY ARTERY BYPASS GRAFT  08/05/08   x5, [place] to the LAD.    CORONARY STENT PLACEMENT  07/2008   2 BMS to RCA   HEMORRHOID SURGERY     HYDROCELE EXCISION     INCISIONAL HERNIA REPAIR  04/16/2006   Left flank incisional hernia repair with mesh.  Dr Joya Nissen HERNIA REPAIR N/A 02/11/2021   Procedure: LAPAROSCOPIC REPAIR OF  INCISIONAL RECURRENT LEFT FLANK HERNIA WITH MESH TAP BLOCK - BILATERAL;  Surgeon: Candyce Champagne, MD;  Location: [facility];   Service: General;  Laterality: N/A;   left leg broken     PROSTATE BIOPSY  2007   neg   ROTATOR CUFF REPAIR     bilateral; Dr. Jinger Mount   Patient Active Problem List   Diagnosis Date Noted   Acute otitis media 08/31/2023   COVID-19 08/31/2023   Allergic rhinitis 02/17/2023   Weight loss 02/17/2023   Acute upper respiratory infection 02/09/2023   Cough 11/22/2022   Pulsatile tinnitus of right ear 08/23/2022   Sensorineural hearing loss (SNHL) of both ears 08/23/2022   Aortic atherosclerosis (HCC) 06/21/2022   Foreign body (FB) in soft tissue 11/07/2021   Chronic left flank pain 06/29/2021   Kidney stone 06/08/2021   Constipation, chronic 02/18/2021   Acute urinary retention 02/18/2021   Diverticular disease of colon 02/11/2021   History of colonic polyps 02/11/2021   Colon cancer screening 02/11/2021   Recurrent left flank incisional hernia s/p lap repair w mesh 02/11/2021 02/11/2021   Postcalcaneal bursitis of left foot 04/09/2020   Disorder of left eustachian tube 08/14/2019   Vertigo 12/19/2018   Partial degenerative rupture of right biceps tendon 07/18/2017   Right lateral epicondylitis 07/18/2017  Hyperglycemia 04/18/2017   High foot arch 03/29/2016   Arthritis, midfoot 03/29/2016   Tibialis posterior tendinitis 03/29/2016   Left foot pain 03/28/2016   Mitral regurgitation 09/05/2013   Carotid artery disease without cerebral infarction [facility])    Hx of CABG    Ejection fraction    Preventative health care 08/30/2012   Atrial fibrillation (HCC)    Thrombocytopenia (HCC) 07/26/2009   Headache 07/15/2009   Hyperlipidemia 02/09/2009   Anxiety state 02/09/2009   Depression 02/09/2009   BENIGN PROSTATIC HYPERTROPHY 02/09/2009   PSA, INCREASED 02/09/2009   NEPHROLITHIASIS, HX OF 02/09/2009   CAD (coronary artery disease) 07/11/2008     PCP: Rosalia Colonel  REFERRING PROVIDER: Garlan Juniper  REFERRING DIAG: R hip pain  THERAPY DIAG:  Pain in right hip  Rationale for  Evaluation and Treatment: Rehabilitation  ONSET DATE:    SUBJECTIVE:   SUBJECTIVE STATEMENT: Pt states much improvement in pain in R glute.    Eval: Pt states increased pain in R glute for several months. Recently he played golf and reports "couldn't walk" after. He has not had pain in this area before. He usually golfs 3 x/wk. Feels his swing is not the best due to other things hurting in the past, L ankle and L torso.  States most pain in R glute with turning, changing directions, bending forward and playing golf.    PERTINENT HISTORY: CAD, A-fib  , CABG, MI,   PAIN:  Are you having pain? Yes: NPRS scale: up to 6 /10 Pain location: R glute  Pain description: very painful,  Aggravating factors: turning, golfing, bending fwd.  Relieving factors: can walk it off   PRECAUTIONS: None  WEIGHT BEARING RESTRICTIONS: No  FALLS:  Has patient fallen in last 6 months? No   PLOF: Independent  PATIENT GOALS:  Decreased pain , keep playing golf   NEXT MD VISIT:   OBJECTIVE:   DIAGNOSTIC FINDINGS:   PATIENT SURVEYS:    COGNITION: Overall cognitive status: Within functional limits for tasks assessed     SENSATION: WFL  EDEMA:    POSTURE:    No Significant postural limitations  PALPATION:   Tightness and soreness in R glute, piriformis,  no pain into gr troch or in lumbar spine.  Stiffness/hypomobile hips bil   LOWER EXTREMITY ROM: Hip flexion: mod limitation bilaterally Hip ER: mod limitation bilaterally Hip IR: mild limitation bilaterally, mild soreness on L.   LOWER EXTREMITY MMT:  MMT Left eval Right  eval Left 4/29 Right 4/29  Hip flexion 4 4 5  4+  Hip extension      Hip abduction 4 4 5 5   Hip adduction      Hip internal rotation      Hip external rotation 4 4    Knee flexion      Knee extension 5 5    Ankle dorsiflexion 5 5    Ankle plantarflexion      Ankle inversion      Ankle eversion       (Blank rows = not tested)  LOWER EXTREMITY  SPECIAL TESTS:     GAIT:   TODAY'S TREATMENT:  DATE:   04/08/2024 Therapeutic Exercise: Aerobic:  bike L1 x 6 min Supine:  SLR x 10 bil;   bridging 2 x 10;   LTR x 10; manual hip IR and ER prom.  Seated: Standing:  hip abd 2x 10 bil;  Stretches:  seated hamstring stretch 30 sec x 3 bil;  Neuromuscular Re-education: L/R weight shifts with trunk rotation 2 x10, tactile cuing for L foot to stay in place with weight shift, slow and controlled x 15;    Slow march for balance x 20;   Tandem stance 30 sec x 1 bil;   SLS 20 sec x 3 bil;  Manual Therapy:  Therapeutic Activity:  Fwd 6in step up no Ue support x 10 bil; Squats to chair 2 x 10;  sit to stand x 10 - education on mechanics.  Self Care: Modalities:     Therapeutic Exercise: Aerobic: Supine:  SLR x 10 bil;   bridging 2 x 10;   LTR x 10;  Seated: Standing:  hip abd 2x 10 bil;  Stretches:  seated hamstring stretch 30 sec x 3 bil;  Neuromuscular Re-education: L/R weight shifts with trunk rotation 2 x10, tactile cuing for L foot to stay in place with weight shift, slow and controlled x 15;   Slow march for balance x 20;  SLS 10 sec x 3 bil;  Manual Therapy:  Therapeutic Activity:  Fwd 6in step up no Ue support x 10 bil;  lateral step up x 8 bil  , 6 in step.   Squats to chair x 15;   Self Care: Modalities:    Therapeutic Exercise: Aerobic: Supine:  hip ER fallouts x 10/slow for rom;   Hip IR rom x 10 rom, 5 sec holds; LTR x 10;  Seated: Standing:    lateral step up x 8 bil  , 6 in step.  Stretches:  seated hamstring stretch 30 sec x 3 bil; seated piriformis x 3 bil;  Neuromuscular Re-education: L/R weight shifts with trunk rotation 2 x10, tactile cuing for L foot to stay in place with weight shift;  Slow march for balance x 15;  SLS L 20 sec  3- at counter Manual Therapy:  Therapeutic Activity:   Self Care: Modalities:    PATIENT EDUCATION:  Education details: updated and reviewed HEP Person educated: Patient Education method: Explanation, Demonstration, Tactile cues, Verbal cues, and Handouts Education comprehension: verbalized understanding, returned demonstration, verbal cues required, tactile cues required, and needs further education   HOME EXERCISE PROGRAM: Access Code: [email or web address] URL: [email or web address] Date: 02/28/2024 Prepared by: Terrilee Few  Exercises - Supine Single Knee to Chest Stretch  - 2 x daily - 3 reps - 30 hold - Supine Piriformis Stretch with Leg Straight  - 2 x daily - 3 reps - 20 hold  ASSESSMENT:  CLINICAL IMPRESSION: Pt progressing well with pain and ability for strengthening and functional activity. He has good tolerance for progression of activities and strengthening. Challenged with SLS, but much improving with stability of front leg with lateral weight shifts. Pt to benefit from continued care, likely d/c in next 2 weeks.   Eval: Patient presents with primary complaint of  pain in R glute. He has stiffness in bil hip joints, as well as pain and tightness in R glute. He has increased pain with bending, turning and transfers. Pt with decreased ability for full functional activities. Pt will  benefit from skilled PT to improve deficits and pain and to return to PLOF.  OBJECTIVE IMPAIRMENTS: decreased activity tolerance, decreased mobility, decreased ROM, decreased strength, increased muscle spasms, impaired flexibility, improper body mechanics, and pain.   ACTIVITY LIMITATIONS: bending, standing, squatting, stairs, transfers, and locomotion level  PARTICIPATION LIMITATIONS: cleaning, driving, shopping, community activity, and yard work  PERSONAL FACTORS: Time since onset of injury/illness/exacerbation are also affecting patient's functional outcome.   REHAB POTENTIAL: Good  CLINICAL DECISION MAKING:  Stable/uncomplicated  EVALUATION COMPLEXITY: Low   GOALS: Goals reviewed with patient? Yes  SHORT TERM GOALS: Target date: 03/20/2024  Pt to be independent with initial HEP  Goal status: INITIAL  2.  Pt to report decreased pain in R glute to 0-5/10 with activity   Goal status: INITIAL     LONG TERM GOALS: Target date: 04/24/2024   Pt to be independent with final HEP  Goal status: INITIAL  2.  Pt to report decreased pain to 0-2/10 with activity and IADLs.   Goal status: INITIAL  3.  Pt to demonstrate ability for safe weight shifts from R to L for improving stability and safety with golf   Goal status: INITIAL  4.  Pt to report ability for stairs, walking, and transfers in/out of car to be pain free, to improve ability for community activity.   Goal status: INITIAL     PLAN:  PT FREQUENCY: 1-2x/week  PT DURATION: 8 weeks  PLANNED INTERVENTIONS: Therapeutic exercises, Therapeutic activity, Neuromuscular re-education, Patient/Family education, Self Care, Joint mobilization, Joint manipulation, Stair training, Orthotic/Fit training, DME instructions, Aquatic Therapy, Dry Needling, Electrical stimulation, Cryotherapy, Moist heat, Taping, Ultrasound, Ionotophoresis 4mg /ml Dexamethasone, Manual therapy,  Vasopneumatic device, Traction, Spinal manipulation, Spinal mobilization,Balance training, Gait training,   PLAN FOR NEXT SESSION: hip mobility, R glute muscle tightness, strength when tolerated.    Terrilee Few, PT, DPT 9:17 PM  04/08/24

## 2024-04-10 ENCOUNTER — Encounter: Admitting: Physical Therapy

## 2024-04-16 ENCOUNTER — Other Ambulatory Visit: Payer: Self-pay

## 2024-04-16 ENCOUNTER — Ambulatory Visit: Payer: Self-pay

## 2024-04-16 ENCOUNTER — Ambulatory Visit (INDEPENDENT_AMBULATORY_CARE_PROVIDER_SITE_OTHER)

## 2024-04-16 ENCOUNTER — Ambulatory Visit
Admission: EM | Admit: 2024-04-16 | Discharge: 2024-04-16 | Disposition: A | Discharge status: Home / Self Care | Attending: Family Medicine | Admitting: Family Medicine

## 2024-04-16 DIAGNOSIS — R112 Nausea with vomiting, unspecified: Secondary | ICD-10-CM

## 2024-04-16 DIAGNOSIS — B349 Viral infection, unspecified: Secondary | ICD-10-CM | POA: Diagnosis not present

## 2024-04-16 DIAGNOSIS — R059 Cough, unspecified: Secondary | ICD-10-CM | POA: Diagnosis not present

## 2024-04-16 DIAGNOSIS — R051 Acute cough: Secondary | ICD-10-CM

## 2024-04-16 DIAGNOSIS — R509 Fever, unspecified: Secondary | ICD-10-CM | POA: Diagnosis not present

## 2024-04-16 LAB — POC COVID19/FLU A&B COMBO
Covid Antigen, POC: NEGATIVE
Influenza A Antigen, POC: NEGATIVE
Influenza B Antigen, POC: NEGATIVE

## 2024-04-16 MED ORDER — ONDANSETRON 4 MG PO TBDP
4.0000 mg | ORAL_TABLET | Freq: Once | ORAL | Status: AC
  Administered 2024-04-16: 4 mg via ORAL

## 2024-04-16 MED ORDER — IBUPROFEN 400 MG PO TABS: 400.0000 mg | ORAL_TABLET | Freq: Three times a day (TID) | ORAL | 0 refills | Status: AC | PRN

## 2024-04-16 MED ORDER — ONDANSETRON 4 MG PO TBDP: 4.0000 mg | ORAL_TABLET | Freq: Three times a day (TID) | ORAL | 0 refills | Status: DC | PRN

## 2024-04-16 MED ORDER — IBUPROFEN 600 MG PO TABS
600.0000 mg | ORAL_TABLET | Freq: Once | ORAL | Status: AC
  Administered 2024-04-16: 600 mg via ORAL

## 2024-04-16 NOTE — Discharge Instructions (Addendum)
 You have tested negative for COVID and flu.  You were given Zofran  which is a nausea medicine while in the clinic.  You may take this every 8 hours as needed for nausea and/or vomiting.  Take ibuprofen and/or over-the-counter Tylenol  as needed for your fever.  Please focus on hydration/electrolyte replacement with Gatorade, Powerade, Pedialyte, water .  Try to eat bland foods such as crackers, toast, bananas, rice and advance as you tolerate.  If you feel you are unable to eat you may do over-the-counter shakes such as boost or Ensure.  Please follow-up with your PCP in 2 days for recheck.  Please go to the ER if you develop any worsening symptoms which include but are not limited to inability to maintain hydration, cannot control your fever with over-the-counter medication, extreme weakness, or any new concerns that arise.  Hope you feel better soon!

## 2024-04-16 NOTE — Telephone Encounter (Signed)
 Chief Complaint: Vomiting, fever, severe chills, body aches Symptoms: see above Frequency: since yesterday Pertinent Negatives: Patient denies diarrhea Disposition: [] ED /[x] Urgent Care (no appt availability in office) / [] Appointment(In office/virtual)/ []  [facility]/ [] Home Care/ [] Refused Recommended Disposition /[] [facility]/ []  Follow-up with PCP Additional Notes: Patient called in with assistance of his friend, Raenette Bumps. Patient has been vomiting, fever, chills, body aches since yesterday. Patient states the chills are severe. Patient states unknown exposure to anyone with similar symptoms. Patient's friend drove to the home to check on patient due to patient's family being out of town, and due to time constraint and patient symptoms, patient will be going to UC at [employer] now.    Copied from CRM [id number]. Topic: Clinical - Red Word Triage >> Apr 16, 2024 10:36 AM Allyne Areola wrote: Red Word that prompted transfer to Nurse Triage: Raenette Bumps a friend of the patient is calling on behalf of the patient that is experiencing fever, chills, body aches and vomiting. He's been experiencing these symptoms since late afternoon yesterday. Reason for Disposition  Severe chills (i.e., feeling extremely cold WITH shaking chills)  Answer Assessment - Initial Assessment Questions 1. TEMPERATURE: "What is the most recent temperature?"  "How was it measured?"      Most recent temp was 99 2. ONSET: "When did the fever start?"      Since yesterday 3. CHILLS: "Do you have chills?" If yes: "How bad are they?"  (e.g., none, mild, moderate, severe)   - NONE: no chills   - MILD: feeling cold   - MODERATE: feeling very cold, some shivering (feels better under a thick blanket)   - SEVERE: feeling extremely cold with shaking chills (general body shaking, rigors; even under a thick blanket)      Moderate-Severe 4. OTHER SYMPTOMS: "Do you have any other symptoms besides the fever?"  (e.g.,  abdomen pain, cough, diarrhea, earache, headache, sore throat, urination pain)     Vomiting, chills, body ache 5. CAUSE: If there are no symptoms, ask: "What do you think is causing the fever?"      Unsure 6. CONTACTS: "Does anyone else in the family have an infection?"     Not that he knows of 7. TREATMENT: "What have you done so far to treat this fever?" (e.g., medications)     Tylenol , water  8. IMMUNOCOMPROMISE: "Do you have of the following: diabetes, HIV positive, splenectomy, cancer chemotherapy, chronic steroid treatment, transplant patient, etc."     No  Protocols used: Fever-A-AH

## 2024-04-16 NOTE — ED Notes (Signed)
 Pt was able to tolerate the ice water . Pt was given animal crackers to eat. We didn't have any saltine crackers.

## 2024-04-16 NOTE — ED Notes (Addendum)
 Pt c/o N/V, generalized weakness, fatigue, feverx2d. Pt states he hasn't been able to hold anything down. Pt states took extra strength tylenol  this morning. Pt unsure what time

## 2024-04-16 NOTE — ED Provider Notes (Signed)
 [facility]    CSN: [id number] Arrival date & time: 04/16/24  1134      History   Chief Complaint No chief complaint on file.   HPI [name] SPOSITO is a 83 y.o. male  presents for evaluation of URI symptoms for 2 days. Patient reports associated symptoms of, fever, fatigue/weakness, cough, congestion, nausea and vomiting. Denies diarrhea, sore throat, ear pain, shortness of breath, abdominal pain.  States he has no appetite but when he drinks fluids he stay down.  States he is urinating normally.  Patient does not have a hx of asthma. Patient is not an active smoker.   Reports no sick contacts.  Pt has taken Tylenol  OTC for symptoms. Pt has no other concerns at this time.   HPI  Past Medical History:  Diagnosis Date   Aortic atherosclerosis (HCC) 06/21/2022   Arthritis    Atrial fibrillation (HCC)    post MI-amiodarone stopped 07/2008   Benign prostatic hypertrophy    CAD (coronary artery disease) 07/2008   2 BMS placed for MI, August, 2009   Carotid artery disease without cerebral infarction [facility])    CORONARY ARTERY BYPASS GRAFT, HX OF 07/13/2009   August, 2009, Dr. Sherene Dilling   Ejection fraction    .   History of kidney stones    History of nephrolithiasis    Hx of CABG    Hx of colonoscopy    Hyperlipidemia    HYPERLIPIDEMIA 02/09/2009   Qualifier: Diagnosis of  By: Autry Legions MD, Alveda Aures    Increased prostate specific antigen (PSA) velocity    Myocardial infarction (HCC)    NEPHROLITHIASIS, HX OF 02/09/2009   Qualifier: Diagnosis of  By: Autry Legions MD, Alveda Aures    Prostatitis    PSA elevation 09/06/2012   Thrombocytopenia [facility])     Patient Active Problem List   Diagnosis Date Noted   Acute otitis media 08/31/2023   COVID-19 08/31/2023   Allergic rhinitis 02/17/2023   Weight loss 02/17/2023   Acute upper respiratory infection 02/09/2023   Cough 11/22/2022   Pulsatile tinnitus of right ear 08/23/2022   Sensorineural hearing loss (SNHL) of both ears 08/23/2022   Aortic  atherosclerosis (HCC) 06/21/2022   Foreign body (FB) in soft tissue 11/07/2021   Chronic left flank pain 06/29/2021   Kidney stone 06/08/2021   Constipation, chronic 02/18/2021   Acute urinary retention 02/18/2021   Diverticular disease of colon 02/11/2021   History of colonic polyps 02/11/2021   Colon cancer screening 02/11/2021   Recurrent left flank incisional hernia s/p lap repair w mesh 02/11/2021 02/11/2021   Postcalcaneal bursitis of left foot 04/09/2020   Disorder of left eustachian tube 08/14/2019   Vertigo 12/19/2018   Partial degenerative rupture of right biceps tendon 07/18/2017   Right lateral epicondylitis 07/18/2017   Hyperglycemia 04/18/2017   High foot arch 03/29/2016   Arthritis, midfoot 03/29/2016   Tibialis posterior tendinitis 03/29/2016   Left foot pain 03/28/2016   Mitral regurgitation 09/05/2013   Carotid artery disease without cerebral infarction [facility])    Hx of CABG    Ejection fraction    Preventative health care 08/30/2012   Atrial fibrillation (HCC)    Thrombocytopenia (HCC) 07/26/2009   Headache 07/15/2009   Hyperlipidemia 02/09/2009   Anxiety state 02/09/2009   Depression 02/09/2009   BENIGN PROSTATIC HYPERTROPHY 02/09/2009   PSA, INCREASED 02/09/2009   NEPHROLITHIASIS, HX OF 02/09/2009   CAD (coronary artery disease) 07/11/2008    Past Surgical History:  Procedure Laterality  Date   CORONARY ARTERY BYPASS GRAFT  08/05/08   x5, LIMA to the LAD.    CORONARY STENT PLACEMENT  07/2008   2 BMS to RCA   HEMORRHOID SURGERY     HYDROCELE EXCISION     INCISIONAL HERNIA REPAIR  04/16/2006   Left flank incisional hernia repair with mesh.  Dr Joya Nissen HERNIA REPAIR N/A 02/11/2021   Procedure: LAPAROSCOPIC REPAIR OF  INCISIONAL RECURRENT LEFT FLANK HERNIA WITH MESH TAP BLOCK - BILATERAL;  Surgeon: Candyce Champagne, MD;  Location: [facility];  Service: General;  Laterality: N/A;   left leg broken     PROSTATE BIOPSY  2007   neg   ROTATOR CUFF REPAIR      bilateral; Dr. Jinger Mount       Home Medications    Prior to Admission medications   Medication Sig Start Date End Date Taking? Authorizing Provider  ibuprofen (ADVIL) 400 MG tablet Take 1 tablet (400 mg total) by mouth every 8 (eight) hours as needed for fever. 04/16/24  Yes Jerrell Hart, Jodi R, NP  ondansetron  (ZOFRAN -ODT) 4 MG disintegrating tablet Take 1 tablet (4 mg total) by mouth every 8 (eight) hours as needed for nausea or vomiting. 04/16/24  Yes Liisa Picone, Jodi R, NP  acetaminophen  (TYLENOL ) 500 MG tablet Take 1,000 mg by mouth every 6 (six) hours as needed for mild pain.    [provider]  aspirin  81 MG EC tablet Take 1 tablet (81 mg total) by mouth daily. Swallow whole. 09/06/12   Roslyn Coombe, MD  Cholecalciferol  (VITAMIN D ) 50 MCG (2000 UT) CAPS Take 2,000 Units by mouth daily.    [provider]  doxycycline  (VIBRA -TABS) 100 MG tablet Take 1 tablet (100 mg total) by mouth 2 (two) times daily. 08/31/23   Zorita Hiss, NP  Multiple Vitamin (MULTIVITAMIN) tablet Take 1 tablet by mouth daily.    [provider]  OVER THE COUNTER MEDICATION Super vitamin C with Zinc    [provider]  simvastatin  (ZOCOR ) 40 MG tablet TAKE 1/2 (ONE-HALF) TABLET BY MOUTH AT BEDTIME 05/31/23   Roslyn Coombe, MD    Family History Family History  Problem Relation Age of Onset   Lung cancer Mother    Leukemia Father    Healthy Brother    Colon cancer Neg Hx    Rectal cancer Neg Hx    Stomach cancer Neg Hx     Social History Social History   Tobacco Use   Smoking status: Never   Smokeless tobacco: Never  Vaping Use   Vaping status: Never Used  Substance Use Topics   Alcohol use: No   Drug use: No     Allergies   Augmentin [amoxicillin-pot clavulanate] and Niacin   Review of Systems Review of Systems  Constitutional:  Positive for fatigue and fever.  HENT:  Positive for congestion.   Respiratory:  Positive for cough.   Gastrointestinal:  Positive for nausea  and vomiting.  Neurological:  Positive for weakness.     Physical Exam Triage Vital Signs ED Triage Vitals [04/16/24 1143]  Encounter Vitals Group     BP 107/65     Systolic BP Percentile      Diastolic BP Percentile      Pulse Rate 68     Resp 16     Temp (!) 102 F (38.9 C)     Temp Source Oral     SpO2 94 %     Weight  Height      Head Circumference      Peak Flow      Pain Score      Pain Loc      Pain Education      Exclude from Growth Chart    No data found.  Updated Vital Signs BP 107/65   Pulse 68   Temp 99.7 F (37.6 C) (Oral)   Resp 16   SpO2 94%   Visual Acuity Right Eye Distance:   Left Eye Distance:   Bilateral Distance:    Right Eye Near:   Left Eye Near:    Bilateral Near:     Physical Exam Vitals and nursing note reviewed.  Constitutional:      General: He is not in acute distress.    Appearance: Normal appearance. He is ill-appearing. He is not toxic-appearing.     Comments: Patient in wheelchair stating he was too weak to walk to the exam room  HENT:     Head: Normocephalic and atraumatic.     Right Ear: Tympanic membrane and ear canal normal.     Left Ear: Tympanic membrane and ear canal normal.     Nose: Congestion present.     Mouth/Throat:     Mouth: Mucous membranes are moist.     Pharynx: No posterior oropharyngeal erythema.  Eyes:     Pupils: Pupils are equal, round, and reactive to light.  Cardiovascular:     Rate and Rhythm: Normal rate and regular rhythm.     Heart sounds: Normal heart sounds.  Pulmonary:     Effort: Pulmonary effort is normal.     Breath sounds: Normal breath sounds. No wheezing or rhonchi.  Musculoskeletal:     Cervical back: Normal range of motion and neck supple.  Lymphadenopathy:     Cervical: No cervical adenopathy.  Skin:    General: Skin is warm and dry.  Neurological:     General: No focal deficit present.     Mental Status: He is alert and oriented to person, place, and time.   Psychiatric:        Mood and Affect: Mood normal.        Behavior: Behavior normal.      UC Treatments / Results  Labs (all labs ordered are listed, but only abnormal results are displayed) Labs Reviewed  POC COVID19/FLU A&B COMBO   Basic metabolic panel Order: [id number]  Status: Final result     Next appt: 04/17/2024 at 01:45 PM in Rehabilitation Terrilee Few, PT)     Dx: Pure hypercholesterolemia   Test Result Released: Yes (seen)     Messages: Seen   1 Result Note     1 Patient Communication          Component Ref Range & Units (hover) 10 mo ago (05/31/23) 1 yr ago (06/21/22) 2 yr ago (06/29/21) 3 yr ago (02/18/21) 3 yr ago (02/11/21) 3 yr ago (02/04/21) 3 yr ago (01/04/21)  Sodium 139 136 139 135 R 136 R 139 R 140 R  Potassium 4.5 3.9 4.7 4.5 R 3.8 R 4.3 R 4.0 R  Chloride 102 99 102 98 R 104 R 102 R 101 R  CO2 28 31 30 24  R 22 R 29 R 32 R  Glucose, Bld 95 96 114 High  192 High  CM 169 High  CM 151 High  CM 120 High  CM  BUN 16 15 17 12  R 16 R 16 R 13 R  Creatinine, Ser 1.16 1.15 1.10 1.09 R 1.08 R 0.97 R 1.00 R  GFR 59.16 Low  60.18 CM 63.91 CM      Comment: Calculated using the CKD-EPI Creatinine Equation (2021)  Calcium 9.5 9.9 9.6 9.5 R 8.4 Low  R 9.4 R 9.4 R  Resulting Agency [place] [facility] [place] [facility] [place] [facility]        Specimen Collected: 05/31/23 14:19 Last Resulted: 05/31/23 16:46   EKG   Radiology No results found.  Procedures Procedures (including critical care time)  Medications Ordered in UC Medications  ondansetron  (ZOFRAN -ODT) disintegrating tablet 4 mg (4 mg Oral Given 04/16/24 1218)  ibuprofen (ADVIL) tablet 600 mg (600 mg Oral Given 04/16/24 1218)    Initial Impression / Assessment and Plan / UC Course  I have reviewed the triage vital signs and the nursing notes.  Pertinent labs & imaging results that were available during my care of the patient were reviewed by me and  considered in my medical decision making (see chart for details).     Negative rapid flu and COVID testing.  Patient given Zofran  in clinic for nausea and ibuprofen for fever.  Reports nausea resolved and he was able to drink water  and keep crackers down.  States he feels better after doing this.  Fever improved after ibuprofen.  Chest x-ray right read without any obvious consolidation, will contact for any positive results based on radiology overread.  Discussed likely viral illness.  Concern for reported extreme fatigue with patient and discussed ER evaluation but he declined stating he feels better after getting some crackers and water  down.  He was able to walk from exam room to x-ray room without difficulty.  Encouraged hydration/electrolyte replacement as well as eating and/or over-the-counter shakes such as boost or Ensure.  Zofran  every 8 hours as needed he also requested an Rx for ibuprofen as well.  He has follow-up with his PCP in 2 days for recheck.  Strict ER precautions reviewed and patient verbalized understanding. Final Clinical Impressions(s) / UC Diagnoses   Final diagnoses:  Acute cough  Viral illness  Nausea and vomiting, unspecified vomiting type     Discharge Instructions      You have tested negative for COVID and flu.  You were given Zofran  which is a nausea medicine while in the clinic.  You may take this every 8 hours as needed for nausea and/or vomiting.  Take ibuprofen and/or over-the-counter Tylenol  as needed for your fever.  Please focus on hydration/electrolyte replacement with Gatorade, Powerade, Pedialyte, water .  Try to eat bland foods such as crackers, toast, bananas, rice and advance as you tolerate.  If you feel you are unable to eat you may do over-the-counter shakes such as boost or Ensure.  Please follow-up with your PCP in 2 days for recheck.  Please go to the ER if you develop any worsening symptoms which include but are not limited to inability to maintain  hydration, cannot control your fever with over-the-counter medication, extreme weakness, or any new concerns that arise.  Hope you feel better soon!     ED Prescriptions     Medication Sig Dispense Auth. Provider   ondansetron  (ZOFRAN -ODT) 4 MG disintegrating tablet Take 1 tablet (4 mg total) by mouth every 8 (eight) hours as needed for nausea or vomiting. 10 tablet Paxton Kanaan, Jodi R, NP   ibuprofen (ADVIL) 400 MG tablet Take 1 tablet (400 mg total) by  mouth every 8 (eight) hours as needed for fever. 15 tablet Khylen Riolo, Jodi R, NP      PDMP not reviewed this encounter.   Alleen Arbour, NP 04/16/24 1304

## 2024-04-17 ENCOUNTER — Encounter: Admitting: Physical Therapy

## 2024-04-22 ENCOUNTER — Encounter: Payer: Self-pay | Admitting: Physical Therapy

## 2024-04-22 ENCOUNTER — Ambulatory Visit (INDEPENDENT_AMBULATORY_CARE_PROVIDER_SITE_OTHER): Admitting: Physical Therapy

## 2024-04-22 DIAGNOSIS — M25551 Pain in right hip: Secondary | ICD-10-CM | POA: Diagnosis not present

## 2024-04-22 NOTE — Therapy (Signed)
 OUTPATIENT PHYSICAL THERAPY LOWER EXTREMITY TREATMENT   Patient Name: [name] MRN: [id number] DOB:January 24, 1941, 83 y.o., male Today's Date: 04/22/2024  END OF SESSION:  PT End of Session - 04/22/24 1202     Visit Number 7    Number of Visits 16    Date for PT Re-Evaluation 04/24/24    Authorization Type Medicare    PT Start Time 1157    PT Stop Time 1235    PT Time Calculation (min) 38 min    Activity Tolerance Patient tolerated treatment well    Behavior During Therapy [facility] for tasks assessed/performed               Past Medical History:  Diagnosis Date   Aortic atherosclerosis (HCC) 06/21/2022   Arthritis    Atrial fibrillation (HCC)    post MI-amiodarone stopped 07/2008   Benign prostatic hypertrophy    CAD (coronary artery disease) 07/2008   2 BMS placed for MI, August, 2009   Carotid artery disease without cerebral infarction [facility])    CORONARY ARTERY BYPASS GRAFT, HX OF 07/13/2009   August, 2009, Dr. Sherene Dilling   Ejection fraction    .   History of kidney stones    History of nephrolithiasis    Hx of CABG    Hx of colonoscopy    Hyperlipidemia    HYPERLIPIDEMIA 02/09/2009   Qualifier: Diagnosis of  By: Autry Legions MD, Alveda Aures    Increased prostate specific antigen (PSA) velocity    Myocardial infarction (HCC)    NEPHROLITHIASIS, HX OF 02/09/2009   Qualifier: Diagnosis of  By: Autry Legions MD, Alveda Aures    Prostatitis    PSA elevation 09/06/2012   Thrombocytopenia [facility])    Past Surgical History:  Procedure Laterality Date   CORONARY ARTERY BYPASS GRAFT  08/05/08   x5, LIMA to the LAD.    CORONARY STENT PLACEMENT  07/2008   2 BMS to RCA   HEMORRHOID SURGERY     HYDROCELE EXCISION     INCISIONAL HERNIA REPAIR  04/16/2006   Left flank incisional hernia repair with mesh.  Dr Joya Nissen HERNIA REPAIR N/A 02/11/2021   Procedure: LAPAROSCOPIC REPAIR OF  INCISIONAL RECURRENT LEFT FLANK HERNIA WITH MESH TAP BLOCK - BILATERAL;  Surgeon: Candyce Champagne, MD;  Location: [facility];   Service: General;  Laterality: N/A;   left leg broken     PROSTATE BIOPSY  2007   neg   ROTATOR CUFF REPAIR     bilateral; Dr. Jinger Mount   Patient Active Problem List   Diagnosis Date Noted   Acute otitis media 08/31/2023   COVID-19 08/31/2023   Allergic rhinitis 02/17/2023   Weight loss 02/17/2023   Acute upper respiratory infection 02/09/2023   Cough 11/22/2022   Pulsatile tinnitus of right ear 08/23/2022   Sensorineural hearing loss (SNHL) of both ears 08/23/2022   Aortic atherosclerosis (HCC) 06/21/2022   Foreign body (FB) in soft tissue 11/07/2021   Chronic left flank pain 06/29/2021   Kidney stone 06/08/2021   Constipation, chronic 02/18/2021   Acute urinary retention 02/18/2021   Diverticular disease of colon 02/11/2021   History of colonic polyps 02/11/2021   Colon cancer screening 02/11/2021   Recurrent left flank incisional hernia s/p lap repair w mesh 02/11/2021 02/11/2021   Postcalcaneal bursitis of left foot 04/09/2020   Disorder of left eustachian tube 08/14/2019   Vertigo 12/19/2018   Partial degenerative rupture of right biceps tendon 07/18/2017   Right lateral epicondylitis 07/18/2017  Hyperglycemia 04/18/2017   High foot arch 03/29/2016   Arthritis, midfoot 03/29/2016   Tibialis posterior tendinitis 03/29/2016   Left foot pain 03/28/2016   Mitral regurgitation 09/05/2013   Carotid artery disease without cerebral infarction [facility])    Hx of CABG    Ejection fraction    Preventative health care 08/30/2012   Atrial fibrillation (HCC)    Thrombocytopenia (HCC) 07/26/2009   Headache 07/15/2009   Hyperlipidemia 02/09/2009   Anxiety state 02/09/2009   Depression 02/09/2009   BENIGN PROSTATIC HYPERTROPHY 02/09/2009   PSA, INCREASED 02/09/2009   NEPHROLITHIASIS, HX OF 02/09/2009   CAD (coronary artery disease) 07/11/2008     PCP: Rosalia Colonel  REFERRING PROVIDER: Garlan Juniper  REFERRING DIAG: R hip pain  THERAPY DIAG:  Pain in right hip  Rationale for  Evaluation and Treatment: Rehabilitation  ONSET DATE:    SUBJECTIVE:   SUBJECTIVE STATEMENT: Pt states much improvement in pain in R glute. He was very sick last week, went to urgent care. Thinks he breathed in insect spray, states feeling much better, but still has slight headache and mild nausea.    Eval: Pt states increased pain in R glute for several months. Recently he played golf and reports "couldn't walk" after. He has not had pain in this area before. He usually golfs 3 x/wk. Feels his swing is not the best due to other things hurting in the past, L ankle and L torso.  States most pain in R glute with turning, changing directions, bending forward and playing golf.    PERTINENT HISTORY: CAD, A-fib  , CABG, MI,   PAIN:  Are you having pain? Yes: NPRS scale: up to 6 /10 Pain location: R glute  Pain description: very painful,  Aggravating factors: turning, golfing, bending fwd.  Relieving factors: can walk it off   PRECAUTIONS: None  WEIGHT BEARING RESTRICTIONS: No  FALLS:  Has patient fallen in last 6 months? No   PLOF: Independent  PATIENT GOALS:  Decreased pain , keep playing golf   NEXT MD VISIT:   OBJECTIVE:   DIAGNOSTIC FINDINGS:   PATIENT SURVEYS:    COGNITION: Overall cognitive status: Within functional limits for tasks assessed     SENSATION: WFL  EDEMA:    POSTURE:    No Significant postural limitations  PALPATION:   Tightness and soreness in R glute, piriformis,  no pain into gr troch or in lumbar spine.  Stiffness/hypomobile hips bil   LOWER EXTREMITY ROM: Hip flexion: mod limitation bilaterally Hip ER: mod limitation bilaterally Hip IR: mild limitation bilaterally, mild soreness on L.   LOWER EXTREMITY MMT:  MMT Left eval Right  eval Left 4/29 Right 4/29  Hip flexion 4 4 5  4+  Hip extension      Hip abduction 4 4 5 5   Hip adduction      Hip internal rotation      Hip external rotation 4 4    Knee flexion      Knee  extension 5 5    Ankle dorsiflexion 5 5    Ankle plantarflexion      Ankle inversion      Ankle eversion       (Blank rows = not tested)  LOWER EXTREMITY SPECIAL TESTS:     GAIT:   TODAY'S TREATMENT:  DATE:   04/22/2024 Therapeutic Exercise: reviewed all in detail for HEP Aerobic:   Supine:  SLR x 10 bil;   bridging 2 x 10;   hip ER rom x 15;   LTR x 15;   hip abd 2 x 10 bil;  Seated: Standing:  hip abd 2x 10 bil;  Stretches:      Neuromuscular Re-education:  Tandem stance 30 sec x 1 bil;   SLS 20 sec x 3 bil; (reviewed for HEP)  Manual Therapy:  Therapeutic Activity:   Self Care: Modalities:     PATIENT EDUCATION:  Education details: updated and reviewed HEP Person educated: Patient Education method: Explanation, Demonstration, Tactile cues, Verbal cues, and Handouts Education comprehension: verbalized understanding, returned demonstration, verbal cues required, tactile cues required, and needs further education   HOME EXERCISE PROGRAM: Access Code: [email or web address]  ASSESSMENT:  CLINICAL IMPRESSION: Pt has made good progress. He is reporting low pain levels and much improved ability for transfers, car transfers and functional activity without pain. He has met goals and is ready for d/c to HEP. Decreased standing activity/balance done today due to pt still not feeling well from last week, but hip doing well. Discussed f/u with sports med as needed in future. Also discussed need to f/u with regular doctor if he has ongoing headache/nausea symptoms from last week.   Eval: Patient presents with primary complaint of  pain in R glute. He has stiffness in bil hip joints, as well as pain and tightness in R glute. He has increased pain with bending, turning and transfers. Pt with decreased ability for full functional activities. Pt will  benefit from skilled PT  to improve deficits and pain and to return to PLOF.   OBJECTIVE IMPAIRMENTS: decreased activity tolerance, decreased mobility, decreased ROM, decreased strength, increased muscle spasms, impaired flexibility, improper body mechanics, and pain.   ACTIVITY LIMITATIONS: bending, standing, squatting, stairs, transfers, and locomotion level  PARTICIPATION LIMITATIONS: cleaning, driving, shopping, community activity, and yard work  PERSONAL FACTORS: Time since onset of injury/illness/exacerbation are also affecting patient's functional outcome.   REHAB POTENTIAL: Good  CLINICAL DECISION MAKING: Stable/uncomplicated  EVALUATION COMPLEXITY: Low   GOALS: Goals reviewed with patient? Yes  SHORT TERM GOALS: Target date: 03/20/2024  Pt to be independent with initial HEP  Goal status: MET  2.  Pt to report decreased pain in R glute to 0-5/10 with activity  Goal status: MET     LONG TERM GOALS: Target date: 04/24/2024   Pt to be independent with final HEP  Goal status: MET  2.  Pt to report decreased pain to 0-2/10 with activity and IADLs.   Goal status: MET  3.  Pt to demonstrate ability for safe weight shifts from R to L for improving stability and safety with golf   Goal status: MET  4.  Pt to report ability for stairs, walking, and transfers in/out of car to be pain free, to improve ability for community activity.   Goal status: MET    PLAN:  PT FREQUENCY: 1-2x/week  PT DURATION: 8 weeks  PLANNED INTERVENTIONS: Therapeutic exercises, Therapeutic activity, Neuromuscular re-education, Patient/Family education, Self Care, Joint mobilization, Joint manipulation, Stair training, Orthotic/Fit training, DME instructions, Aquatic Therapy, Dry Needling, Electrical stimulation, Cryotherapy, Moist heat, Taping, Ultrasound, Ionotophoresis 4mg /ml Dexamethasone, Manual therapy,  Vasopneumatic device, Traction, Spinal manipulation, Spinal mobilization,Balance training, Gait  training,   PLAN FOR NEXT SESSION:     Terrilee Few, PT, DPT 12:45 PM  04/22/24   PHYSICAL THERAPY  DISCHARGE SUMMARY  Visits from Start of Care: 7   Plan: Patient agrees to discharge.  Patient goals were  met. Patient is being discharged due to meeting the stated rehab goals.     Terrilee Few, PT, DPT 12:45 PM  04/22/24

## 2024-04-23 ENCOUNTER — Ambulatory Visit: Payer: Self-pay

## 2024-04-23 NOTE — Telephone Encounter (Signed)
 This RN made the first attempt to contact the pt. Pt did not answer, RN LVM with a CB number.   Copied From CRM [id number]. Reason for Triage: Patient is concerned as he is still not feeling well with congestion after he inhaled roach poison spray about a week ago. He went to urgent care & was advised to get checked by pcp. Dr. Koleen Perna first available is 5/28, he didn't want to see a different provider sooner. He just wants to be sure that how he's feeling is normal after the event + how long is normal to feel sick with this.

## 2024-04-23 NOTE — Telephone Encounter (Signed)
 This RN made third attempt to contact patient with no answer. A voicemail was left with call back number provided. This RN will send a high priority message to clinical office.

## 2024-04-23 NOTE — Telephone Encounter (Signed)
 This RN made a second attempt to contact the pt. RN received the message "your call cannot be completed at this time."

## 2024-04-23 NOTE — Telephone Encounter (Signed)
 Pt has appt with another provider for 5/15

## 2024-04-23 NOTE — Telephone Encounter (Signed)
  Chief Complaint: dry cough  Symptoms: cough/congestion  Disposition: [] ED /[] Urgent Care (no appt availability in office) / [x] Appointment(In office/virtual)/ []  [facility]/ [] Home Care/ [] Refused Recommended Disposition /[] [facility]/ []  Follow-up with PCP Additional Notes: Pt calling with concerns of cough from inhaling roach spray last week. Pt went to urgent care and was told to follow up with PCP.  Pt states he is getting better but wants to be checked out. Pt denies fever and SOB.  Pt has appt 5/15 @ 0810. RN gave care advice to seen emergent care if breathing difficulties. Pt verbalized understanding.             Copied from CRM [id number]. Topic: Clinical - Pink Word Triage >> Apr 23, 2024 11:24 AM Luane Rumps D wrote: Reason for Triage: Patient is concerned as he is still not feeling well with congestion after he inhaled roach poison spray about a week ago. He went to urgent care & was advised to get checked by pcp. Dr. Koleen Perna first available is 5/28, he didn't want to see a different provider sooner. He just wants to be sure that how he's feeling is normal after the event + how long is normal to feel sick with this. Reason for Disposition  [1] Continuous (nonstop) coughing interferes with work or school AND [2] no improvement using cough treatment per Care Advice  Answer Assessment - Initial Assessment Questions 1. ONSET: "When did the cough begin?"      Over week ago  2. SEVERITY: "How bad is the cough today?"      "Not bad" 3. SPUTUM: "Describe the color of your sputum" (none, dry cough; clear, white, yellow, green)     Na  4. HEMOPTYSIS: "Are you coughing up any blood?" If so ask: "How much?" (flecks, streaks, tablespoons, etc.)     Na  5. DIFFICULTY BREATHING: "Are you having difficulty breathing?" If Yes, ask: "How bad is it?" (e.g., mild, moderate, severe)    - MILD: No SOB at rest, mild SOB with walking, speaks normally in sentences, can lie down,  no retractions, pulse < 100.    - MODERATE: SOB at rest, SOB with minimal exertion and prefers to sit, cannot lie down flat, speaks in phrases, mild retractions, audible wheezing, pulse 100-120.    - SEVERE: Very SOB at rest, speaks in single words, struggling to breathe, sitting hunched forward, retractions, pulse > 120      Denies  6. FEVER: "Do you have a fever?" If Yes, ask: "What is your temperature, how was it measured, and when did it start?"     Denies  7. CARDIAC HISTORY: "Do you have any history of heart disease?" (e.g., heart attack, congestive heart failure)      Denies  8. LUNG HISTORY: "Do you have any history of lung disease?"  (e.g., pulmonary embolus, asthma, emphysema)     Denies   10. OTHER SYMPTOMS: "Do you have any other symptoms?" (e.g., runny nose, wheezing, chest pain)       No appetite  Protocols used: Cough - Acute Non-Productive-A-AH

## 2024-04-24 ENCOUNTER — Encounter: Payer: Self-pay | Admitting: Internal Medicine

## 2024-04-24 ENCOUNTER — Ambulatory Visit (INDEPENDENT_AMBULATORY_CARE_PROVIDER_SITE_OTHER): Admitting: Internal Medicine

## 2024-04-24 VITALS — BP 110/73 | HR 63 | Temp 97.9°F | Ht 63.0 in | Wt 127.0 lb

## 2024-04-24 DIAGNOSIS — R051 Acute cough: Secondary | ICD-10-CM | POA: Diagnosis not present

## 2024-04-24 DIAGNOSIS — R634 Abnormal weight loss: Secondary | ICD-10-CM | POA: Diagnosis not present

## 2024-04-24 NOTE — Assessment & Plan Note (Addendum)
 New  irritant exposure cough  Recent CXR 1 mo ago Ventolin prn Use a gas mask for spraying

## 2024-04-24 NOTE — Progress Notes (Signed)
 Subjective:  Patient ID: [name], male    DOB: 11/18/1941  Age: 83 y.o. MRN: [id number]  CC: Cough (Dry Cough x1 week)   HPI [name] presents for dry cough x 1 week- it started after he used a cockroach fumigation bomb. C/o wt loss x 1 week  Outpatient Medications Prior to Visit  Medication Sig Dispense Refill   acetaminophen  (TYLENOL ) 500 MG tablet Take 1,000 mg by mouth every 6 (six) hours as needed for mild pain.     aspirin  81 MG EC tablet Take 1 tablet (81 mg total) by mouth daily. Swallow whole. 30 tablet 12   Cholecalciferol  (VITAMIN D ) 50 MCG (2000 UT) CAPS Take 2,000 Units by mouth daily.     doxycycline  (VIBRA -TABS) 100 MG tablet Take 1 tablet (100 mg total) by mouth 2 (two) times daily. 10 tablet 0   ibuprofen  (ADVIL ) 400 MG tablet Take 1 tablet (400 mg total) by mouth every 8 (eight) hours as needed for fever. 15 tablet 0   Multiple Vitamin (MULTIVITAMIN) tablet Take 1 tablet by mouth daily.     ondansetron  (ZOFRAN -ODT) 4 MG disintegrating tablet Take 1 tablet (4 mg total) by mouth every 8 (eight) hours as needed for nausea or vomiting. 10 tablet 0   OVER THE COUNTER MEDICATION Super vitamin C with Zinc     simvastatin  (ZOCOR ) 40 MG tablet TAKE 1/2 (ONE-HALF) TABLET BY MOUTH AT BEDTIME 45 tablet 3   No facility-administered medications prior to visit.    ROS: Review of Systems  Constitutional:  Negative for appetite change, fatigue and unexpected weight change.  HENT:  Negative for congestion, nosebleeds, sneezing, sore throat and trouble swallowing.   Eyes:  Negative for itching and visual disturbance.  Respiratory:  Positive for cough. Negative for chest tightness.   Cardiovascular:  Negative for chest pain, palpitations and leg swelling.  Gastrointestinal:  Negative for abdominal distention, blood in stool, diarrhea and nausea.  Genitourinary:  Negative for frequency and hematuria.  Musculoskeletal:  Negative for back pain, gait problem, joint swelling  and neck pain.  Skin:  Negative for rash.  Neurological:  Negative for dizziness, tremors, speech difficulty and weakness.  Psychiatric/Behavioral:  Negative for agitation, dysphoric mood and sleep disturbance. The patient is not nervous/anxious.     Objective:  BP 110/73   Pulse 63   Temp 97.9 F (36.6 C) (Oral)   Ht 5\' 3"  (1.6 m)   Wt 127 lb (57.6 kg)   SpO2 96%   BMI 22.50 kg/m   BP Readings from Last 3 Encounters:  04/24/24 110/73  04/16/24 107/65  02/05/24 130/82    Wt Readings from Last 3 Encounters:  04/24/24 127 lb (57.6 kg)  02/05/24 131 lb (59.4 kg)  08/31/23 130 lb 6 oz (59.1 kg)    Physical Exam Constitutional:      General: He is not in acute distress.    Appearance: He is well-developed. He is obese.     Comments: NAD  Eyes:     Conjunctiva/sclera: Conjunctivae normal.     Pupils: Pupils are equal, round, and reactive to light.  Neck:     Thyroid : No thyromegaly.     Vascular: No JVD.  Cardiovascular:     Rate and Rhythm: Normal rate and regular rhythm.     Heart sounds: Normal heart sounds. No murmur heard.    No friction rub. No gallop.  Pulmonary:     Effort: Pulmonary effort is normal. No respiratory distress.  Breath sounds: Normal breath sounds. No wheezing or rales.  Chest:     Chest wall: No tenderness.  Abdominal:     General: Bowel sounds are normal. There is no distension.     Palpations: Abdomen is soft. There is no mass.     Tenderness: There is no abdominal tenderness. There is no guarding or rebound.  Musculoskeletal:        General: No tenderness. Normal range of motion.     Cervical back: Normal range of motion.  Lymphadenopathy:     Cervical: No cervical adenopathy.  Skin:    General: Skin is warm and dry.     Findings: No rash.  Neurological:     Mental Status: He is alert and oriented to person, place, and time.     Cranial Nerves: No cranial nerve deficit.     Motor: No abnormal muscle tone.     Coordination:  Coordination normal.     Gait: Gait normal.     Deep Tendon Reflexes: Reflexes are normal and symmetric.  Psychiatric:        Behavior: Behavior normal.        Thought Content: Thought content normal.        Judgment: Judgment normal.     Lab Results  Component Value Date   WBC 6.3 05/31/2023   HGB 13.9 05/31/2023   HCT 41.9 05/31/2023   PLT 132.0 (L) 05/31/2023   GLUCOSE 95 05/31/2023   CHOL 119 05/31/2023   TRIG 141.0 05/31/2023   HDL 43.70 05/31/2023   LDLDIRECT 79.0 06/29/2022   LDLCALC 47 05/31/2023   ALT 15 05/31/2023   AST 28 05/31/2023   NA 139 05/31/2023   K 4.5 05/31/2023   CL 102 05/31/2023   CREATININE 1.16 05/31/2023   BUN 16 05/31/2023   CO2 28 05/31/2023   TSH 2.75 05/31/2023   PSA 5.72 (H) 05/31/2023   HGBA1C 6.1 05/31/2023    DG Chest 2 View Result Date: 04/16/2024 CLINICAL DATA:  Cough, fever. EXAM: CHEST - 2 VIEW COMPARISON:  November 22, 2022. FINDINGS: The heart size and mediastinal contours are within normal limits. Both lungs are clear. The visualized skeletal structures are unremarkable. IMPRESSION: No active cardiopulmonary disease. Electronically Signed   By: Rosalene Colon M.D.   On: 04/16/2024 13:26    Assessment & Plan:   Problem List Items Addressed This Visit     Cough - Primary   New  irritant exposure cough  Recent CXR 1 mo ago Ventolin prn Use a gas mask for spraying      Weight loss   New, recurrent x 1 wk (ideal wt 134 lbs) Pt declined labs, work up RTC Dr [name] in 6 wks         No orders of the defined types were placed in this encounter.     Follow-up: Return in about 6 weeks (around 06/05/2024) for f/u with PCP.  Anitra Barn, MD

## 2024-04-24 NOTE — Assessment & Plan Note (Addendum)
 New, recurrent x 1 wk (ideal wt 134 lbs) Pt declined labs, work up RTC Dr Autry Legions in 6 wks

## 2024-08-06 ENCOUNTER — Ambulatory Visit (INDEPENDENT_AMBULATORY_CARE_PROVIDER_SITE_OTHER)

## 2024-08-06 VITALS — Ht 63.0 in | Wt 133.0 lb

## 2024-08-06 DIAGNOSIS — Z0000 Encounter for general adult medical examination without abnormal findings: Secondary | ICD-10-CM | POA: Diagnosis not present

## 2024-08-06 NOTE — Patient Instructions (Addendum)
 [name] , Thank you for taking time out of your busy schedule to complete your Annual Wellness Visit with me. I enjoyed our conversation and look forward to speaking with you again next year. I, as well as your care team,  appreciate your ongoing commitment to your health goals. Please review the following plan we discussed and let me know if I can assist you in the future. Your Game plan/ To Do List    Referrals: If you haven't heard from the office you've been referred to, please reach out to them at the phone provided.   Follow up Visits: We will see or speak with you next year for your Next Medicare AWV with our clinical staff Have you seen your provider in the last 6 months (3 months if uncontrolled diabetes)? No  Clinician Recommendations:  Aim for 30 minutes of exercise or brisk walking, 6-8 glasses of water , and 5 servings of fruits and vegetables each day.       This is a list of the screenings recommended for you:  Health Maintenance  Topic Date Due   Zoster (Shingles) Vaccine (1 of 2) 02/15/1961   DTaP/Tdap/Td vaccine (2 - Tdap) 02/10/2019   Flu Shot  07/11/2024   Colon Cancer Screening  08/06/2025*   Medicare Annual Wellness Visit  08/06/2025   Pneumococcal Vaccine for age over 97  Completed   HPV Vaccine  Aged Out   Meningitis B Vaccine  Aged Out   COVID-19 Vaccine  Discontinued   Hepatitis C Screening  Discontinued  *Topic was postponed. The date shown is not the original due date.    Advanced directives: (Declined) Advance directive discussed with you today. Even though you declined this today, please call our office should you change your mind, and we can give you the proper paperwork for you to fill out. Advance Care Planning is important because it:  [x]  Makes sure you receive the medical care that is consistent with your values, goals, and preferences  [x]  It provides guidance to your family and loved ones and reduces their decisional burden about whether or not  they are making the right decisions based on your wishes.  Follow the link provided in your after visit summary or read over the paperwork we have mailed to you to help you started getting your Advance Directives in place. If you need assistance in completing these, please reach out to us  so that we can help you!

## 2024-08-06 NOTE — Progress Notes (Signed)
 Subjective:   [name] is a 83 y.o. who presents for a Medicare Wellness preventive visit.  As a reminder, Annual Wellness Visits don't include a physical exam, and some assessments may be limited, especially if this visit is performed virtually. We may recommend an in-person follow-up visit with your provider if needed.  Visit Complete: Virtual I connected with  [name] on 08/06/24 by a audio enabled telemedicine application and verified that I am speaking with the correct person using two identifiers.  Patient Location: Home  Provider Location: Office/Clinic  I discussed the limitations of evaluation and management by telemedicine. The patient expressed understanding and agreed to proceed.  Vital Signs: Because this visit was a virtual/telehealth visit, some criteria may be missing or patient reported. Any vitals not documented were not able to be obtained and vitals that have been documented are patient reported.  VideoDeclined- This patient declined Librarian, academic. Therefore the visit was completed with audio only.  Persons Participating in Visit: Patient.  AWV Questionnaire: No: Patient Medicare AWV questionnaire was not completed prior to this visit.  Cardiac Risk Factors include: advanced age (>70men, >52 women);dyslipidemia;male gender     Objective:    Today's Vitals   08/06/24 1551  Weight: 133 lb (60.3 kg)  Height: 5' 3 (1.6 m)   Body mass index is 23.56 kg/m.     08/06/2024    3:54 PM 02/28/2024    2:58 PM 07/26/2023    8:22 AM 09/04/2021   12:19 AM 08/28/2021   10:01 AM 02/11/2021    1:00 PM 02/04/2021   11:16 AM  Advanced Directives  Does Patient Have a Medical Advance Directive? No No Yes No No No No  Type of Surveyor, minerals;Living will      Copy of Healthcare Power of Attorney in Chart?   No - copy requested      Would patient like information on creating a medical advance  directive? No - Patient declined    No - Patient declined No - Patient declined     Current Medications (verified) Outpatient Encounter Medications as of 08/06/2024  Medication Sig   acetaminophen  (TYLENOL ) 500 MG tablet Take 1,000 mg by mouth every 6 (six) hours as needed for mild pain.   aspirin  81 MG EC tablet Take 1 tablet (81 mg total) by mouth daily. Swallow whole.   Cholecalciferol  (VITAMIN D ) 50 MCG (2000 UT) CAPS Take 2,000 Units by mouth daily.   ibuprofen  (ADVIL ) 400 MG tablet Take 1 tablet (400 mg total) by mouth every 8 (eight) hours as needed for fever.   Multiple Vitamin (MULTIVITAMIN) tablet Take 1 tablet by mouth daily.   ondansetron  (ZOFRAN -ODT) 4 MG disintegrating tablet Take 1 tablet (4 mg total) by mouth every 8 (eight) hours as needed for nausea or vomiting.   OVER THE COUNTER MEDICATION Super vitamin C with Zinc   simvastatin  (ZOCOR ) 40 MG tablet TAKE 1/2 (ONE-HALF) TABLET BY MOUTH AT BEDTIME   doxycycline  (VIBRA -TABS) 100 MG tablet Take 1 tablet (100 mg total) by mouth 2 (two) times daily. (Patient not taking: Reported on 08/06/2024)   No facility-administered encounter medications on file as of 08/06/2024.    Allergies (verified) Augmentin [amoxicillin-pot clavulanate] and Niacin   History: Past Medical History:  Diagnosis Date   Aortic atherosclerosis (HCC) 06/21/2022   Arthritis    Atrial fibrillation (HCC)    post MI-amiodarone stopped 07/2008   Benign prostatic hypertrophy  CAD (coronary artery disease) 07/2008   2 BMS placed for MI, August, 2009   Carotid artery disease without cerebral infarction [facility])    CORONARY ARTERY BYPASS GRAFT, HX OF 07/13/2009   August, 2009, Dr. Lucas   Ejection fraction    .   History of kidney stones    History of nephrolithiasis    Hx of CABG    Hx of colonoscopy    Hyperlipidemia    HYPERLIPIDEMIA 02/09/2009   Qualifier: Diagnosis of  By: Norleen MD, Lynwood ORN    Increased prostate specific antigen (PSA) velocity     Myocardial infarction (HCC)    NEPHROLITHIASIS, HX OF 02/09/2009   Qualifier: Diagnosis of  By: Norleen MD, [place] ORN    Prostatitis    PSA elevation 09/06/2012   Thrombocytopenia [facility])    Past Surgical History:  Procedure Laterality Date   CORONARY ARTERY BYPASS GRAFT  08/05/08   x5, LIMA to the LAD.    CORONARY STENT PLACEMENT  07/2008   2 BMS to RCA   HEMORRHOID SURGERY     HYDROCELE EXCISION     INCISIONAL HERNIA REPAIR  04/16/2006   Left flank incisional hernia repair with mesh.  Dr Eletha FONTANA HERNIA REPAIR N/A 02/11/2021   Procedure: LAPAROSCOPIC REPAIR OF  INCISIONAL RECURRENT LEFT FLANK HERNIA WITH MESH TAP BLOCK - BILATERAL;  Surgeon: Sheldon Standing, MD;  Location: [facility];  Service: General;  Laterality: N/A;   left leg broken     PROSTATE BIOPSY  2007   neg   ROTATOR CUFF REPAIR     bilateral; Dr. Jane   Family History  Problem Relation Age of Onset   Lung cancer Mother    Leukemia Father    Healthy Brother    Colon cancer Neg Hx    Rectal cancer Neg Hx    Stomach cancer Neg Hx    Social History   Socioeconomic History   Marital status: Married    Spouse name: [name]   Number of children: 1   Years of education: Not on file   Highest education level: Not on file  Occupational History   Occupation: Retired  Tobacco Use   Smoking status: Never   Smokeless tobacco: Never  Vaping Use   Vaping status: Never Used  Substance and Sexual Activity   Alcohol use: No   Drug use: No   Sexual activity: Yes    Birth control/protection: None  Other Topics Concern   Not on file  Social History Narrative   Married   Social Drivers of Health   Financial Resource Strain: Low Risk  (08/06/2024)   Overall Financial Resource Strain (CARDIA)    Difficulty of Paying Living Expenses: Not hard at all  Food Insecurity: No Food Insecurity (08/06/2024)   Hunger Vital Sign    Worried About Running Out of Food in the Last Year: Never true    Ran Out of Food in the Last Year:  Never true  Transportation Needs: No Transportation Needs (08/06/2024)   PRAPARE - Administrator, Civil Service (Medical): No    Lack of Transportation (Non-Medical): No  Physical Activity: Sufficiently Active (08/06/2024)   Exercise Vital Sign    Days of Exercise per Week: 3 days    Minutes of Exercise per Session: 90 min  Stress: No Stress Concern Present (08/06/2024)   [employer] of Occupational Health - Occupational Stress Questionnaire    Feeling of Stress: Not at all  Social Connections:  Moderately Integrated (08/06/2024)   Social Connection and Isolation Panel    Frequency of Communication with Friends and Family: Three times a week    Frequency of Social Gatherings with Friends and Family: Three times a week    Attends Religious Services: More than 4 times per year    Active Member of Clubs or Organizations: No    Attends Banker Meetings: Never    Marital Status: Married    Tobacco Counseling Counseling given: Not Answered    Clinical Intake:  Pre-visit preparation completed: Yes  Pain : No/denies pain     BMI - recorded: 23.56 Nutritional Status: BMI of 19-24  Normal Nutritional Risks: None Diabetes: No  Lab Results  Component Value Date   HGBA1C 6.1 05/31/2023   HGBA1C 6.4 06/29/2022   HGBA1C 6.3 06/29/2021     How often do you need to have someone help you when you read instructions, pamphlets, or other written materials from your doctor or pharmacy?: 1 - Never  Interpreter Needed?: No  Information entered by :: Verdie Saba, CMA   Activities of Daily Living     08/06/2024    3:57 PM  In your present state of health, do you have any difficulty performing the following activities:  Hearing? 0  Vision? 0  Difficulty concentrating or making decisions? 0  Walking or climbing stairs? 0  Dressing or bathing? 0  Doing errands, shopping? 0  Preparing Food and eating ? N  Using the Toilet? N  In the past six months,  have you accidently leaked urine? N  Do you have problems with loss of bowel control? N  Managing your Medications? N  Managing your Finances? N  Housekeeping or managing your Housekeeping? N    Patient Care Team: Norleen [place] ORN, MD as PCP - General Pietro Redell RAMAN, MD as PCP - Cardiology (Cardiology) Sheldon Standing, MD as Consulting Physician (General Surgery) Watt Norleen, MD as Attending Physician (Urology) Micky Reyes BIRCH, MD (Cardiology) Livingston Rigg, MD as Consulting Physician (Dermatology) Charmayne Molly, MD as Consulting Physician (Ophthalmology)  I have updated your Care Teams any recent Medical Services you may have received from other providers in the past year.     Assessment:   This is a routine wellness examination for [employer].  Hearing/Vision screen Hearing Screening - Comments:: Denies hearing difficulties   Vision Screening - Comments:: Wears rx glasses - up to date with routine eye exams with Dr Molly Charmayne   Goals Addressed               This Visit's Progress     Patient Stated (pt-stated)        Patient stated he continues to exercise and watch diet - plays golf 3x a week       Depression Screen     08/06/2024    3:57 PM 07/26/2023    8:28 AM 05/31/2023    1:14 PM 02/09/2023    3:01 PM 06/29/2022   10:08 AM 06/29/2022    9:42 AM 06/21/2022    1:13 PM  PHQ 2/9 Scores  PHQ - 2 Score 0 0 0 0 0 0 0  PHQ- 9 Score 0 0  0  0     Fall Risk     08/06/2024    3:57 PM 07/26/2023    8:23 AM 05/31/2023    1:14 PM 02/09/2023    3:01 PM 06/29/2022   10:08 AM  Fall Risk   Falls in the  past year? 0 0 0 0 0  Number falls in past yr: 0 0 0 0 0  Injury with Fall? 0 0 0 0 0  Risk for fall due to : No Fall Risks No Fall Risks No Fall Risks No Fall Risks   Follow up Falls evaluation completed;Falls prevention discussed Falls prevention discussed;Falls evaluation completed Falls evaluation completed Education provided;Falls evaluation completed     MEDICARE RISK  AT HOME:  Medicare Risk at Home Any stairs in or around the home?: No If so, are there any without handrails?: No Home free of loose throw rugs in walkways, pet beds, electrical cords, etc?: Yes Adequate lighting in your home to reduce risk of falls?: Yes Life alert?: No Use of a cane, walker or w/c?: No Grab bars in the bathroom?: No Shower chair or bench in shower?: No Elevated toilet seat or a handicapped toilet?: No  TIMED UP AND GO:  Was the test performed?  No  Cognitive Function: 6CIT completed        08/06/2024    3:59 PM 07/26/2023    8:24 AM  6CIT Screen  What Year? 0 points 0 points  What month? 0 points 0 points  What time? 0 points 0 points  Count back from 20 0 points 0 points  Months in reverse 0 points 0 points  Repeat phrase 2 points 0 points  Total Score 2 points 0 points    Immunizations Immunization History  Administered Date(s) Administered   Fluad Quad(high Dose 65+) 08/14/2019   INFLUENZA, HIGH DOSE SEASONAL PF 10/04/2017, 08/30/2018   Influenza,inj,Quad PF,6+ Mos 09/12/2013, 01/14/2015   PFIZER(Purple Top)SARS-COV-2 Vaccination 02/08/2020, 03/09/2020   Pneumococcal Conjugate-13 09/12/2013   Pneumococcal Polysaccharide-23 08/31/2008   Td 02/09/2009   Zoster, Live 02/09/2009    Screening Tests Health Maintenance  Topic Date Due   Zoster Vaccines- Shingrix (1 of 2) 02/15/1961   DTaP/Tdap/Td (2 - Tdap) 02/10/2019   INFLUENZA VACCINE  07/11/2024   Colonoscopy  08/06/2025 (Originally 10/10/2023)   Medicare Annual Wellness (AWV)  08/06/2025   Pneumococcal Vaccine: 50+ Years  Completed   HPV VACCINES  Aged Out   Meningococcal B Vaccine  Aged Out   COVID-19 Vaccine  Discontinued   Hepatitis C Screening  Discontinued    Health Maintenance  Health Maintenance Due  Topic Date Due   Zoster Vaccines- Shingrix (1 of 2) 02/15/1961   DTaP/Tdap/Td (2 - Tdap) 02/10/2019   INFLUENZA VACCINE  07/11/2024   Health Maintenance Items Addressed:  08/06/2024  Additional Screening:  Vision Screening: Recommended annual ophthalmology exams for early detection of glaucoma and other disorders of the eye. Would you like a referral to an eye doctor? No    Dental Screening: Recommended annual dental exams for proper oral hygiene  Community Resource Referral / Chronic Care Management: CRR required this visit?  No   CCM required this visit?  No   Plan:    I have personally reviewed and noted the following in the patient's chart:   Medical and social history Use of alcohol, tobacco or illicit drugs  Current medications and supplements including opioid prescriptions. Patient is not currently taking opioid prescriptions. Functional ability and status Nutritional status Physical activity Advanced directives List of other physicians Hospitalizations, surgeries, and ER visits in previous 12 months Vitals Screenings to include cognitive, depression, and falls Referrals and appointments  In addition, I have reviewed and discussed with patient certain preventive protocols, quality metrics, and best practice recommendations. A written personalized care  plan for preventive services as well as general preventive health recommendations were provided to patient.   Verdie CHRISTELLA Saba, CMA   08/06/2024   After Visit Summary: (MyChart) Due to this being a telephonic visit, the after visit summary with patients personalized plan was offered to patient via MyChart   Notes: Nothing significant to report at this time.

## 2024-08-10 ENCOUNTER — Other Ambulatory Visit: Payer: Self-pay | Admitting: Internal Medicine

## 2024-09-11 NOTE — Progress Notes (Signed)
 Referring-James John, MD Reason for referral-coronary artery disease  HPI: 83 year old male for evaluation of coronary artery disease at request of Lynwood Rush MD. Had inferior MI 2009 with BMS to RCA followed by emergent CABG (LIMA to LAD, SVG to diagonal, SVG to PDA, sequential SVG to OM1 and OM2). Also with h/o HTN, hyperlipidemia and atrial fibrillation (post MI). Last echo 9/14 showed normal LV function, mild MR, mild RVE. Carotid dopplers 10/16 showed 1-39 bilateral stenosis. Since last seen January 2022, patient denies dyspnea, chest pain, palpitations or syncope.  Current Outpatient Medications  Medication Sig Dispense Refill   acetaminophen  (TYLENOL ) 500 MG tablet Take 1,000 mg by mouth every 6 (six) hours as needed for mild pain.     aspirin  81 MG EC tablet Take 1 tablet (81 mg total) by mouth daily. Swallow whole. 30 tablet 12   Cholecalciferol  (VITAMIN D ) 50 MCG (2000 UT) CAPS Take 2,000 Units by mouth daily.     doxycycline  (VIBRA -TABS) 100 MG tablet Take 1 tablet (100 mg total) by mouth 2 (two) times daily. (Patient not taking: Reported on 08/06/2024) 10 tablet 0   ibuprofen  (ADVIL ) 400 MG tablet Take 1 tablet (400 mg total) by mouth every 8 (eight) hours as needed for fever. 15 tablet 0   Multiple Vitamin (MULTIVITAMIN) tablet Take 1 tablet by mouth daily.     ondansetron  (ZOFRAN -ODT) 4 MG disintegrating tablet Take 1 tablet (4 mg total) by mouth every 8 (eight) hours as needed for nausea or vomiting. 10 tablet 0   OVER THE COUNTER MEDICATION Super vitamin C with Zinc     simvastatin  (ZOCOR ) 40 MG tablet TAKE 1/2 (ONE-HALF) TABLET BY MOUTH AT BEDTIME 45 tablet 0   No current facility-administered medications for this visit.    Allergies  Allergen Reactions   Augmentin [Amoxicillin-Pot Clavulanate] Other (See Comments)    C diff diarrhea   Niacin Itching    Leg aches and itching     Past Medical History:  Diagnosis Date   Aortic atherosclerosis 06/21/2022    Arthritis    Atrial fibrillation (HCC)    post MI-amiodarone stopped 07/2008   Benign prostatic hypertrophy    CAD (coronary artery disease) 07/2008   2 BMS placed for MI, August, 2009   Carotid artery disease without cerebral infarction    CORONARY ARTERY BYPASS GRAFT, HX OF 07/13/2009   August, 2009, Dr. Lucas   Ejection fraction    .   History of kidney stones    History of nephrolithiasis    Hx of CABG    Hx of colonoscopy    Hyperlipidemia    HYPERLIPIDEMIA 02/09/2009   Qualifier: Diagnosis of  By: Rush MD, Lynwood ORN    Increased prostate specific antigen (PSA) velocity    Myocardial infarction (HCC)    NEPHROLITHIASIS, HX OF 02/09/2009   Qualifier: Diagnosis of  By: Rush MD, Lynwood ORN    Prostatitis    PSA elevation 09/06/2012   Thrombocytopenia     Past Surgical History:  Procedure Laterality Date   CORONARY ARTERY BYPASS GRAFT  08/05/08   x5, LIMA to the LAD.    CORONARY STENT PLACEMENT  07/2008   2 BMS to RCA   HEMORRHOID SURGERY     HYDROCELE EXCISION     INCISIONAL HERNIA REPAIR  04/16/2006   Left flank incisional hernia repair with mesh.  Dr Eletha FONTANA HERNIA REPAIR N/A 02/11/2021   Procedure: LAPAROSCOPIC REPAIR OF  INCISIONAL RECURRENT LEFT FLANK  HERNIA WITH MESH TAP BLOCK - BILATERAL;  Surgeon: Sheldon Standing, MD;  Location: [facility];  Service: General;  Laterality: N/A;   left leg broken     PROSTATE BIOPSY  2007   neg   ROTATOR CUFF REPAIR     bilateral; Dr. Jane    Social History   Socioeconomic History   Marital status: Married    Spouse name: Nena   Number of children: 1   Years of education: Not on file   Highest education level: Not on file  Occupational History   Occupation: Retired  Tobacco Use   Smoking status: Never   Smokeless tobacco: Never  Vaping Use   Vaping status: Never Used  Substance and Sexual Activity   Alcohol use: No   Drug use: No   Sexual activity: Yes    Birth control/protection: None  Other Topics Concern   Not on  file  Social History Narrative   Married   Social Drivers of Health   Financial Resource Strain: Low Risk  (08/06/2024)   Overall Financial Resource Strain (CARDIA)    Difficulty of Paying Living Expenses: Not hard at all  Food Insecurity: No Food Insecurity (08/06/2024)   Hunger Vital Sign    Worried About Running Out of Food in the Last Year: Never true    Ran Out of Food in the Last Year: Never true  Transportation Needs: No Transportation Needs (08/06/2024)   PRAPARE - Administrator, Civil Service (Medical): No    Lack of Transportation (Non-Medical): No  Physical Activity: Sufficiently Active (08/06/2024)   Exercise Vital Sign    Days of Exercise per Week: 3 days    Minutes of Exercise per Session: 90 min  Stress: No Stress Concern Present (08/06/2024)   [employer] of Occupational Health - Occupational Stress Questionnaire    Feeling of Stress: Not at all  Social Connections: Moderately Integrated (08/06/2024)   Social Connection and Isolation Panel    Frequency of Communication with Friends and Family: Three times a week    Frequency of Social Gatherings with Friends and Family: Three times a week    Attends Religious Services: More than 4 times per year    Active Member of Clubs or Organizations: No    Attends Banker Meetings: Never    Marital Status: Married  Catering manager Violence: Not At Risk (08/06/2024)   Humiliation, Afraid, Rape, and Kick questionnaire    Fear of Current or Ex-Partner: No    Emotionally Abused: No    Physically Abused: No    Sexually Abused: No    Family History  Problem Relation Age of Onset   Lung cancer Mother    Leukemia Father    Healthy Brother    Colon cancer Neg Hx    Rectal cancer Neg Hx    Stomach cancer Neg Hx     ROS: no fevers or chills, productive cough, hemoptysis, dysphasia, odynophagia, melena, hematochezia, dysuria, hematuria, rash, seizure activity, orthopnea, PND, pedal edema,  claudication. Remaining systems are negative.  Physical Exam:   There were no vitals taken for this visit.  General:  Well developed/well nourished in NAD Skin warm/dry Patient not depressed No peripheral clubbing Back-normal HEENT-normal/normal eyelids Neck supple/normal carotid upstroke bilaterally; right carotid bruit; no JVD; no thyromegaly chest - CTA/ normal expansion CV - RRR/normal S1 and S2; no murmurs, rubs or gallops;  PMI nondisplaced Abdomen -NT/ND, no HSM, no mass, + bowel sounds, no bruit 2+ femoral  pulses, no bruits Ext-no edema, chords, 2+ DP Neuro-grossly nonfocal  EKG Interpretation Date/Time:  Thursday September 25 2024 09:09:11 EDT Ventricular Rate:  56 PR Interval:  192 QRS Duration:  90 QT Interval:  438 QTC Calculation: 422 R Axis:   80  Text Interpretation: Sinus bradycardia Confirmed by Pietro Rogue ([id number]) on 09/25/2024 9:09:54 AM    A/P  1 coronary artery disease status post coronary bypass and graft-plan to continue medical therapy with aspirin  and statin.  Will arrange echocardiogram to reassess LV function.  2 hypertension-patient's blood pressure is elevated but he states typically controlled.  I have asked him to follow this and we will add medications as needed.  3 hyperlipidemia-continue Zocor .  Check lipids and liver.  If not at goal we will transition to Crestor.  4 bruit-schedule carotid Dopplers.  Rogue Pietro, MD

## 2024-09-25 ENCOUNTER — Ambulatory Visit: Attending: Cardiology | Admitting: Cardiology

## 2024-09-25 ENCOUNTER — Encounter: Payer: Self-pay | Admitting: Cardiology

## 2024-09-25 VITALS — BP 150/60 | HR 56 | Ht 63.0 in | Wt 129.0 lb

## 2024-09-25 DIAGNOSIS — I2510 Atherosclerotic heart disease of native coronary artery without angina pectoris: Secondary | ICD-10-CM | POA: Insufficient documentation

## 2024-09-25 DIAGNOSIS — E7800 Pure hypercholesterolemia, unspecified: Secondary | ICD-10-CM | POA: Insufficient documentation

## 2024-09-25 DIAGNOSIS — I779 Disorder of arteries and arterioles, unspecified: Secondary | ICD-10-CM | POA: Diagnosis not present

## 2024-09-25 NOTE — Patient Instructions (Signed)
   Testing/Procedures: Your physician has requested that you have a carotid duplex. This test is an ultrasound of the carotid arteries in your neck. It looks at blood flow through these arteries that supply the brain with blood. Allow one hour for this exam. There are no restrictions or special instructions. [address]   Your physician has requested that you have an echocardiogram. Echocardiography is a painless test that uses sound waves to create images of your heart. It provides your doctor with information about the size and shape of your heart and how well your heart's chambers and valves are working. This procedure takes approximately one hour. There are no restrictions for this procedure. Please do NOT wear cologne, perfume, aftershave, or lotions (deodorant is allowed). Please arrive 15 minutes prior to your appointment time.  Please note: We ask at that you not bring children with you during ultrasound (echo/ vascular) testing. Due to room size and safety concerns, children are not allowed in the ultrasound rooms during exams. Our front office staff cannot provide observation of children in our lobby area while testing is being conducted. An adult accompanying a patient to their appointment will only be allowed in the ultrasound room at the discretion of the ultrasound technician under special circumstances. We apologize for any inconvenience. [address]  Follow-Up: At [facility], you and your health needs are our priority.  As part of our continuing mission to provide you with exceptional heart care, our providers are all part of one team.  This team includes your primary Cardiologist (physician) and Advanced Practice Providers or APPs (Physician Assistants and Nurse Practitioners) who all work together to provide you with the care you need, when you need it.  Your next appointment:   12 month(s)  Provider:   Redell Shallow, MD

## 2024-09-26 ENCOUNTER — Ambulatory Visit: Payer: Self-pay | Admitting: Cardiology

## 2024-09-26 LAB — HEPATIC FUNCTION PANEL
ALT: 13 IU/L (ref 0–44)
AST: 29 IU/L (ref 0–40)
Albumin: 4.4 g/dL (ref 3.7–4.7)
Alkaline Phosphatase: 48 IU/L (ref 48–129)
Bilirubin Total: 0.6 mg/dL (ref 0.0–1.2)
Bilirubin, Direct: 0.25 mg/dL (ref 0.00–0.40)
Total Protein: 7.1 g/dL (ref 6.0–8.5)

## 2024-09-26 LAB — LIPID PANEL
Chol/HDL Ratio: 2.7 ratio (ref 0.0–5.0)
Cholesterol, Total: 120 mg/dL (ref 100–199)
HDL: 45 mg/dL (ref >39)
LDL Chol Calc (NIH): 56 mg/dL (ref 0–99)
Triglycerides: 105 mg/dL (ref 0–149)
VLDL Cholesterol Cal: 19 mg/dL (ref 5–40)

## 2024-10-30 ENCOUNTER — Ambulatory Visit (HOSPITAL_BASED_OUTPATIENT_CLINIC_OR_DEPARTMENT_OTHER)
Admission: RE | Admit: 2024-10-30 | Discharge: 2024-10-30 | Disposition: A | Discharge status: Home / Self Care | Source: Ambulatory Visit | Attending: Cardiology | Admitting: Cardiology

## 2024-10-30 ENCOUNTER — Ambulatory Visit (HOSPITAL_COMMUNITY)
Admission: RE | Admit: 2024-10-30 | Discharge: 2024-10-30 | Disposition: A | Discharge status: Home / Self Care | Source: Ambulatory Visit | Attending: Cardiology | Admitting: Cardiology

## 2024-10-30 DIAGNOSIS — I779 Disorder of arteries and arterioles, unspecified: Secondary | ICD-10-CM | POA: Insufficient documentation

## 2024-10-30 DIAGNOSIS — I2510 Atherosclerotic heart disease of native coronary artery without angina pectoris: Secondary | ICD-10-CM | POA: Insufficient documentation

## 2024-10-30 DIAGNOSIS — I6523 Occlusion and stenosis of bilateral carotid arteries: Secondary | ICD-10-CM | POA: Diagnosis not present

## 2024-10-30 LAB — ECHOCARDIOGRAM COMPLETE
AR max vel: 2.64 cm2
AV Area VTI: 2.36 cm2
AV Area mean vel: 2.24 cm2
AV Mean grad: 4 mmHg
AV Peak grad: 6.8 mmHg
Ao pk vel: 1.3 m/s
Area-P 1/2: 3.13 cm2
S' Lateral: 2.56 cm

## 2024-11-23 ENCOUNTER — Other Ambulatory Visit: Payer: Self-pay | Admitting: Internal Medicine

## 2024-12-02 ENCOUNTER — Ambulatory Visit: Admitting: Internal Medicine

## 2024-12-02 ENCOUNTER — Encounter: Payer: Self-pay | Admitting: Internal Medicine

## 2024-12-02 VITALS — BP 122/80 | HR 62 | Temp 98.7°F | Ht 63.0 in | Wt 130.0 lb

## 2024-12-02 DIAGNOSIS — R739 Hyperglycemia, unspecified: Secondary | ICD-10-CM | POA: Diagnosis not present

## 2024-12-02 DIAGNOSIS — E7800 Pure hypercholesterolemia, unspecified: Secondary | ICD-10-CM | POA: Diagnosis not present

## 2024-12-02 DIAGNOSIS — I4891 Unspecified atrial fibrillation: Secondary | ICD-10-CM | POA: Diagnosis not present

## 2024-12-02 DIAGNOSIS — J069 Acute upper respiratory infection, unspecified: Secondary | ICD-10-CM

## 2024-12-02 MED ORDER — SIMVASTATIN 40 MG PO TABS: ORAL_TABLET | ORAL | 3 refills | Status: AC

## 2024-12-02 NOTE — Progress Notes (Signed)
 Patient ID: [name], male   DOB: 1941-11-07, 83 y.o.   MRN: [id number]        Chief Complaint: follow up tachycardia, uri, hld, hyperglycemia       HPI:  [name] is a 83 y.o. male here with hx of afib who presents with URI symptoms with cough, congestion and ST that since has resolved, but also with this has onset 2 wks rapid heart rate despite good BB compliance.  Trying to follow lower chol diet. Pt denies chest pain, increased sob or doe, wheezing, orthopnea, PND, increased LE swelling, dizziness or syncope.  Pt denies polydipsia, polyuria, or new focal neuro s/s.   Wt Readings from Last 3 Encounters:  12/02/24 130 lb (59 kg)  09/25/24 129 lb (58.5 kg)  08/06/24 133 lb (60.3 kg)   BP Readings from Last 3 Encounters:  12/02/24 122/80  09/25/24 (!) 150/60  04/24/24 110/73         Past Medical History:  Diagnosis Date   Aortic atherosclerosis 06/21/2022   Arthritis    Atrial fibrillation (HCC)    post MI-amiodarone stopped 07/2008   Benign prostatic hypertrophy    CAD (coronary artery disease) 07/2008   2 BMS placed for MI, August, 2009   Carotid artery disease without cerebral infarction    CORONARY ARTERY BYPASS GRAFT, HX OF 07/13/2009   August, 2009, Dr. Lucas   Ejection fraction    .   History of kidney stones    History of nephrolithiasis    Hx of CABG    Hx of colonoscopy    HYPERLIPIDEMIA 02/09/2009   Qualifier: Diagnosis of  By: Norleen MD, Lynwood ORN    Increased prostate specific antigen (PSA) velocity    Myocardial infarction (HCC)    NEPHROLITHIASIS, HX OF 02/09/2009   Qualifier: Diagnosis of  By: Norleen MD, Lynwood ORN    Prostatitis    PSA elevation 09/06/2012   Thrombocytopenia    Past Surgical History:  Procedure Laterality Date   CORONARY ARTERY BYPASS GRAFT  08/05/08   x5, LIMA to the LAD.    CORONARY STENT PLACEMENT  07/2008   2 BMS to RCA   HEMORRHOID SURGERY     HYDROCELE EXCISION     INCISIONAL HERNIA REPAIR  04/16/2006   Left flank  incisional hernia repair with mesh.  Dr Eletha FONTANA HERNIA REPAIR N/A 02/11/2021   Procedure: LAPAROSCOPIC REPAIR OF  INCISIONAL RECURRENT LEFT FLANK HERNIA WITH MESH TAP BLOCK - BILATERAL;  Surgeon: Sheldon Standing, MD;  Location: [facility];  Service: General;  Laterality: N/A;   left leg broken     PROSTATE BIOPSY  2007   neg   ROTATOR CUFF REPAIR     bilateral; Dr. Jane    reports that he has never smoked. He has never used smokeless tobacco. He reports that he does not drink alcohol and does not use drugs. family history includes Healthy in his brother; Leukemia in his father; Lung cancer in his mother. Allergies[1] Medications Ordered Prior to Encounter[2]      ROS:  All others reviewed and negative.  Objective        PE:  BP 122/80 (BP Location: Left Arm, Patient Position: Sitting, Cuff Size: Normal)   Pulse 62   Temp 98.7 F (37.1 C) (Oral)   Ht 5' 3 (1.6 m)   Wt 130 lb (59 kg)   SpO2 98%   BMI 23.03 kg/m  Constitutional: Pt appears in NAD               HENT: Head: NCAT.                Right Ear: External ear normal.                 Left Ear: External ear normal.                Eyes: . Pupils are equal, round, and reactive to light. Conjunctivae and EOM are normal               Nose: without d/c or deformity               Neck: Neck supple. Gross normal ROM               Cardiovascular: Normal rate and regular rhythm.                 Pulmonary/Chest: Effort normal and breath sounds without rales or wheezing.                Abd:  Soft, NT, ND, + BS, no organomegaly               Neurological: Pt is alert. At baseline orientation, motor grossly intact               Skin: Skin is warm. No rashes, no other new lesions, LE edema - none               Psychiatric: Pt behavior is normal without agitation   Micro: none  Cardiac tracings I have personally interpreted today:  ECG - I suspect this is afib with RVR but has possible p wave noted  Pertinent  Radiological findings (summarize): none   Lab Results  Component Value Date   WBC 6.3 05/31/2023   HGB 13.9 05/31/2023   HCT 41.9 05/31/2023   PLT 132.0 (L) 05/31/2023   GLUCOSE 95 05/31/2023   CHOL 120 09/25/2024   TRIG 105 09/25/2024   HDL 45 09/25/2024   LDLDIRECT 79.0 06/29/2022   LDLCALC 56 09/25/2024   ALT 13 09/25/2024   AST 29 09/25/2024   NA 139 05/31/2023   K 4.5 05/31/2023   CL 102 05/31/2023   CREATININE 1.16 05/31/2023   BUN 16 05/31/2023   CO2 28 05/31/2023   TSH 2.75 05/31/2023   PSA 5.72 (H) 05/31/2023   HGBA1C 6.1 05/31/2023   Assessment/Plan:  [name] is a 83 y.o. White or Caucasian [1] male with  has a past medical history of Aortic atherosclerosis (06/21/2022), Arthritis, Atrial fibrillation (HCC), Benign prostatic hypertrophy, CAD (coronary artery disease) (07/2008), Carotid artery disease without cerebral infarction, CORONARY ARTERY BYPASS GRAFT, HX OF (07/13/2009), Ejection fraction, History of kidney stones, History of nephrolithiasis, CABG, colonoscopy, HYPERLIPIDEMIA (02/09/2009), Increased prostate specific antigen (PSA) velocity, Myocardial infarction (HCC), NEPHROLITHIASIS, HX OF (02/09/2009), Prostatitis, PSA elevation (09/06/2012), and Thrombocytopenia.  Acute upper respiratory infection Essentially resolved, likely viral, for mucinex  bid prn  Atrial fibrillation (HCC) ECG with tachycardia I suspect is possible afib with rvr 127 vs SVT, with normal volume status and hemodynamically stable, good compliance with BB, will add card CD 240 every day, refer cardiology urgently, will hold on anticoagulation for now  Hyperglycemia Lab Results  Component Value Date   HGBA1C 6.1 05/31/2023   Stable, pt to continue current medical treatment  - diet, wt control   Hyperlipidemia Lab Results  Component  Value Date   LDLCALC 56 09/25/2024   Stable, pt to continue current statin zocor  20 mg qd  Followup: Return in about 6 months (around  06/02/2025).  Lynwood Rush, MD 12/05/2024 3:36 PM [facility]     [1]  Allergies Allergen Reactions   Augmentin [Amoxicillin-Pot Clavulanate] Other (See Comments)    C diff diarrhea   Niacin Itching    Leg aches and itching  [2]  Current Outpatient Medications on File Prior to Visit  Medication Sig Dispense Refill   acetaminophen  (TYLENOL ) 500 MG tablet Take 1,000 mg by mouth every 6 (six) hours as needed for mild pain.     aspirin  81 MG EC tablet Take 1 tablet (81 mg total) by mouth daily. Swallow whole. 30 tablet 12   Cholecalciferol  (VITAMIN D ) 50 MCG (2000 UT) CAPS Take 2,000 Units by mouth daily.     ibuprofen  (ADVIL ) 400 MG tablet Take 1 tablet (400 mg total) by mouth every 8 (eight) hours as needed for fever. 15 tablet 0   Multiple Vitamin (MULTIVITAMIN) tablet Take 1 tablet by mouth daily.     No current facility-administered medications on file prior to visit.

## 2024-12-02 NOTE — Patient Instructions (Signed)
Please continue all other medications as before, and refills have been done if requested.  Please have the pharmacy call with any other refills you may need.  Please continue your efforts at being more active, low cholesterol diet, and weight control.  Please keep your appointments with your specialists as you may have planned  Please make an Appointment to return in 6 months, or sooner if needed 

## 2024-12-05 ENCOUNTER — Encounter: Payer: Self-pay | Admitting: Internal Medicine

## 2024-12-05 NOTE — Assessment & Plan Note (Signed)
Lab Results  Component Value Date   HGBA1C 6.1 05/31/2023   Stable, pt to continue current medical treatment  - diet,wt control

## 2024-12-05 NOTE — Assessment & Plan Note (Addendum)
 Essentially resolved, likely viral, for mucinex  bid prn

## 2024-12-05 NOTE — Assessment & Plan Note (Addendum)
 ECG with tachycardia I suspect is possible afib with rvr 127 vs SVT, with normal volume status and hemodynamically stable, good compliance with BB, will add card CD 240 every day, refer cardiology urgently, will hold on anticoagulation for now

## 2024-12-05 NOTE — Assessment & Plan Note (Signed)
 Lab Results  Component Value Date   LDLCALC 56 09/25/2024   Stable, pt to continue current statin zocor  20 mg qd

## 2025-08-10 ENCOUNTER — Ambulatory Visit
